# Patient Record
Sex: Male | Born: 1965 | Race: Black or African American | Hispanic: No | Smoking: Current every day smoker
Health system: Southern US, Community
[De-identification: ages and names within clinical notes are randomized; demographics above are authoritative.]

## PROBLEM LIST (undated history)

## (undated) DIAGNOSIS — Z955 Presence of coronary angioplasty implant and graft: Secondary | ICD-10-CM

## (undated) DIAGNOSIS — Z8673 Personal history of transient ischemic attack (TIA), and cerebral infarction without residual deficits: Secondary | ICD-10-CM

## (undated) DIAGNOSIS — J309 Allergic rhinitis, unspecified: Secondary | ICD-10-CM

## (undated) DIAGNOSIS — F319 Bipolar disorder, unspecified: Secondary | ICD-10-CM

## (undated) DIAGNOSIS — I639 Cerebral infarction, unspecified: Secondary | ICD-10-CM

## (undated) DIAGNOSIS — I2510 Atherosclerotic heart disease of native coronary artery without angina pectoris: Secondary | ICD-10-CM

## (undated) DIAGNOSIS — I10 Essential (primary) hypertension: Secondary | ICD-10-CM

## (undated) DIAGNOSIS — R519 Headache, unspecified: Secondary | ICD-10-CM

## (undated) DIAGNOSIS — J45909 Unspecified asthma, uncomplicated: Secondary | ICD-10-CM

## (undated) DIAGNOSIS — K219 Gastro-esophageal reflux disease without esophagitis: Secondary | ICD-10-CM

## (undated) DIAGNOSIS — I252 Old myocardial infarction: Secondary | ICD-10-CM

## (undated) DIAGNOSIS — R001 Bradycardia, unspecified: Secondary | ICD-10-CM

## (undated) DIAGNOSIS — I219 Acute myocardial infarction, unspecified: Secondary | ICD-10-CM

## (undated) DIAGNOSIS — F419 Anxiety disorder, unspecified: Secondary | ICD-10-CM

## (undated) DIAGNOSIS — B1920 Unspecified viral hepatitis C without hepatic coma: Secondary | ICD-10-CM

## (undated) DIAGNOSIS — E7800 Pure hypercholesterolemia, unspecified: Secondary | ICD-10-CM

## (undated) HISTORY — DX: Bradycardia, unspecified: R00.1

## (undated) HISTORY — DX: Headache, unspecified: R51.9

## (undated) HISTORY — DX: Unspecified viral hepatitis C without hepatic coma: B19.20

## (undated) HISTORY — DX: Bipolar disorder, unspecified: F31.9

## (undated) HISTORY — DX: Anxiety disorder, unspecified: F41.9

## (undated) HISTORY — DX: Atherosclerotic heart disease of native coronary artery without angina pectoris: I25.10

## (undated) HISTORY — DX: Allergic rhinitis, unspecified: J30.9

## (undated) HISTORY — DX: Personal history of transient ischemic attack (TIA), and cerebral infarction without residual deficits: Z86.73

## (undated) HISTORY — PX: CORONARY STENT PLACEMENT: SHX1402

## (undated) HISTORY — DX: Presence of coronary angioplasty implant and graft: Z95.5

## (undated) HISTORY — DX: Cerebral infarction, unspecified: I63.9

## (undated) HISTORY — DX: Old myocardial infarction: I25.2

## (undated) HISTORY — DX: Pure hypercholesterolemia, unspecified: E78.00

---

## 2003-09-14 ENCOUNTER — Other Ambulatory Visit: Payer: Self-pay

## 2004-02-25 ENCOUNTER — Other Ambulatory Visit: Payer: Self-pay

## 2004-11-11 ENCOUNTER — Inpatient Hospital Stay: Payer: Self-pay | Admitting: Internal Medicine

## 2004-11-11 ENCOUNTER — Other Ambulatory Visit: Payer: Self-pay

## 2004-11-18 ENCOUNTER — Emergency Department: Payer: Self-pay | Admitting: Emergency Medicine

## 2005-07-30 ENCOUNTER — Emergency Department: Payer: Self-pay | Admitting: Internal Medicine

## 2006-02-01 ENCOUNTER — Other Ambulatory Visit: Payer: Self-pay

## 2006-02-02 ENCOUNTER — Inpatient Hospital Stay: Payer: Self-pay | Admitting: Internal Medicine

## 2006-04-08 ENCOUNTER — Ambulatory Visit: Payer: Self-pay | Admitting: Psychiatry

## 2006-04-08 ENCOUNTER — Inpatient Hospital Stay (HOSPITAL_COMMUNITY): Admission: AD | Admit: 2006-04-08 | Discharge: 2006-04-16 | Payer: Self-pay | Admitting: Psychiatry

## 2006-04-20 ENCOUNTER — Ambulatory Visit: Payer: Self-pay | Admitting: Internal Medicine

## 2006-04-21 ENCOUNTER — Ambulatory Visit: Payer: Self-pay | Admitting: *Deleted

## 2006-07-27 ENCOUNTER — Ambulatory Visit: Payer: Self-pay | Admitting: Internal Medicine

## 2006-10-02 ENCOUNTER — Emergency Department (HOSPITAL_COMMUNITY): Admission: EM | Admit: 2006-10-02 | Discharge: 2006-10-02 | Payer: Self-pay | Admitting: Emergency Medicine

## 2006-10-02 ENCOUNTER — Ambulatory Visit: Payer: Self-pay | Admitting: Cardiology

## 2006-10-04 ENCOUNTER — Emergency Department (HOSPITAL_COMMUNITY): Admission: EM | Admit: 2006-10-04 | Discharge: 2006-10-05 | Payer: Self-pay | Admitting: Emergency Medicine

## 2006-10-05 ENCOUNTER — Ambulatory Visit: Payer: Self-pay | Admitting: Cardiology

## 2006-10-05 ENCOUNTER — Ambulatory Visit: Payer: Self-pay | Admitting: Internal Medicine

## 2006-10-12 ENCOUNTER — Ambulatory Visit (HOSPITAL_COMMUNITY): Admission: RE | Admit: 2006-10-12 | Discharge: 2006-10-12 | Payer: Self-pay | Admitting: Cardiology

## 2006-10-12 ENCOUNTER — Ambulatory Visit: Payer: Self-pay | Admitting: Cardiology

## 2006-11-15 ENCOUNTER — Ambulatory Visit: Payer: Self-pay | Admitting: Cardiology

## 2006-11-15 LAB — CONVERTED CEMR LAB
ALT: 16 units/L (ref 0–40)
Alkaline Phosphatase: 74 units/L (ref 39–117)
Total CHOL/HDL Ratio: 5.8
Total Protein: 7.3 g/dL (ref 6.0–8.3)
Triglycerides: 203 mg/dL (ref 0–149)
VLDL: 41 mg/dL — ABNORMAL HIGH (ref 0–40)

## 2007-03-02 ENCOUNTER — Encounter (INDEPENDENT_AMBULATORY_CARE_PROVIDER_SITE_OTHER): Payer: Self-pay | Admitting: *Deleted

## 2007-05-04 ENCOUNTER — Ambulatory Visit: Payer: Self-pay | Admitting: Internal Medicine

## 2007-05-04 LAB — CONVERTED CEMR LAB
AST: 17 units/L (ref 0–37)
Albumin: 4.6 g/dL (ref 3.5–5.2)
Alkaline Phosphatase: 92 units/L (ref 39–117)
Calcium: 9.6 mg/dL (ref 8.4–10.5)
Chloride: 104 meq/L (ref 96–112)
Glucose, Bld: 96 mg/dL (ref 70–99)
Microalb, Ur: 0.28 mg/dL (ref 0.00–1.89)
Potassium: 4.1 meq/L (ref 3.5–5.3)
Sodium: 137 meq/L (ref 135–145)
Total Protein: 8.2 g/dL (ref 6.0–8.3)
Triglycerides: 401 mg/dL — ABNORMAL HIGH (ref <150)

## 2007-08-28 ENCOUNTER — Emergency Department (HOSPITAL_COMMUNITY): Admission: EM | Admit: 2007-08-28 | Discharge: 2007-08-28 | Payer: Self-pay | Admitting: Emergency Medicine

## 2008-02-19 ENCOUNTER — Inpatient Hospital Stay (HOSPITAL_COMMUNITY): Admission: EM | Admit: 2008-02-19 | Discharge: 2008-02-23 | Payer: Self-pay | Admitting: Emergency Medicine

## 2008-02-19 ENCOUNTER — Ambulatory Visit: Payer: Self-pay | Admitting: Infectious Diseases

## 2008-02-21 ENCOUNTER — Encounter: Payer: Self-pay | Admitting: Infectious Diseases

## 2008-02-23 ENCOUNTER — Inpatient Hospital Stay (HOSPITAL_COMMUNITY): Admission: AD | Admit: 2008-02-23 | Discharge: 2008-02-27 | Payer: Self-pay | Admitting: Psychiatry

## 2008-02-23 ENCOUNTER — Ambulatory Visit: Payer: Self-pay | Admitting: Psychiatry

## 2008-03-09 ENCOUNTER — Ambulatory Visit: Payer: Self-pay | Admitting: Internal Medicine

## 2008-03-09 ENCOUNTER — Inpatient Hospital Stay (HOSPITAL_COMMUNITY): Admission: EM | Admit: 2008-03-09 | Discharge: 2008-03-13 | Payer: Self-pay | Admitting: Emergency Medicine

## 2008-03-13 ENCOUNTER — Inpatient Hospital Stay (HOSPITAL_COMMUNITY): Admission: RE | Admit: 2008-03-13 | Discharge: 2008-03-19 | Payer: Self-pay | Admitting: Psychiatry

## 2008-04-11 ENCOUNTER — Ambulatory Visit: Payer: Self-pay | Admitting: Family Medicine

## 2008-04-11 LAB — CONVERTED CEMR LAB
BUN: 16 mg/dL (ref 6–23)
Benzodiazepines.: NEGATIVE
CO2: 20 meq/L (ref 19–32)
Calcium: 9.7 mg/dL (ref 8.4–10.5)
Chloride: 109 meq/L (ref 96–112)
Creatinine, Ser: 1.27 mg/dL (ref 0.40–1.50)
Creatinine,U: 309.2 mg/dL
Glucose, Bld: 99 mg/dL (ref 70–99)
HCT: 44.4 % (ref 39.0–52.0)
Hemoglobin: 14.4 g/dL (ref 13.0–17.0)
Lymphocytes Relative: 28 % (ref 12–46)
Monocytes Absolute: 1 10*3/uL (ref 0.1–1.0)
Monocytes Relative: 11 % (ref 3–12)
Neutro Abs: 5.3 10*3/uL (ref 1.7–7.7)
Opiate Screen, Urine: NEGATIVE
Phencyclidine (PCP): NEGATIVE
Propoxyphene: NEGATIVE
RBC: 4.74 M/uL (ref 4.22–5.81)
TSH: 0.962 microintl units/mL (ref 0.350–4.50)

## 2008-06-25 ENCOUNTER — Emergency Department (HOSPITAL_COMMUNITY): Admission: EM | Admit: 2008-06-25 | Discharge: 2008-06-25 | Payer: Self-pay | Admitting: Emergency Medicine

## 2008-06-29 ENCOUNTER — Encounter: Payer: Self-pay | Admitting: *Deleted

## 2008-06-29 ENCOUNTER — Ambulatory Visit: Payer: Self-pay | Admitting: *Deleted

## 2008-06-29 ENCOUNTER — Inpatient Hospital Stay (HOSPITAL_COMMUNITY): Admission: AD | Admit: 2008-06-29 | Discharge: 2008-07-10 | Payer: Self-pay | Admitting: *Deleted

## 2008-07-26 ENCOUNTER — Inpatient Hospital Stay (HOSPITAL_COMMUNITY): Admission: AD | Admit: 2008-07-26 | Discharge: 2008-08-03 | Payer: Self-pay | Admitting: Psychiatry

## 2008-07-26 ENCOUNTER — Encounter: Payer: Self-pay | Admitting: Emergency Medicine

## 2008-07-28 ENCOUNTER — Encounter: Payer: Self-pay | Admitting: Psychiatry

## 2008-09-02 ENCOUNTER — Inpatient Hospital Stay (HOSPITAL_COMMUNITY): Admission: EM | Admit: 2008-09-02 | Discharge: 2008-09-04 | Payer: Self-pay | Admitting: Emergency Medicine

## 2008-09-04 ENCOUNTER — Inpatient Hospital Stay (HOSPITAL_COMMUNITY): Admission: RE | Admit: 2008-09-04 | Discharge: 2008-09-11 | Payer: Self-pay | Admitting: *Deleted

## 2008-09-04 ENCOUNTER — Ambulatory Visit: Payer: Self-pay | Admitting: *Deleted

## 2008-09-17 ENCOUNTER — Inpatient Hospital Stay (HOSPITAL_COMMUNITY): Admission: RE | Admit: 2008-09-17 | Discharge: 2008-09-27 | Payer: Self-pay | Admitting: Psychiatry

## 2008-09-17 ENCOUNTER — Other Ambulatory Visit: Payer: Self-pay

## 2008-10-07 ENCOUNTER — Other Ambulatory Visit: Payer: Self-pay | Admitting: Emergency Medicine

## 2008-10-08 ENCOUNTER — Ambulatory Visit: Payer: Self-pay | Admitting: Psychiatry

## 2008-10-08 ENCOUNTER — Inpatient Hospital Stay (HOSPITAL_COMMUNITY): Admission: RE | Admit: 2008-10-08 | Discharge: 2008-10-15 | Payer: Self-pay | Admitting: Psychiatry

## 2008-11-02 ENCOUNTER — Other Ambulatory Visit: Payer: Self-pay | Admitting: Emergency Medicine

## 2008-11-03 ENCOUNTER — Ambulatory Visit: Payer: Self-pay | Admitting: Psychiatry

## 2008-11-03 ENCOUNTER — Inpatient Hospital Stay (HOSPITAL_COMMUNITY): Admission: EM | Admit: 2008-11-03 | Discharge: 2008-11-13 | Payer: Self-pay | Admitting: Psychiatry

## 2008-12-27 ENCOUNTER — Encounter: Admission: RE | Admit: 2008-12-27 | Discharge: 2008-12-27 | Payer: Self-pay | Admitting: Internal Medicine

## 2009-01-07 ENCOUNTER — Other Ambulatory Visit: Payer: Self-pay

## 2009-01-08 ENCOUNTER — Ambulatory Visit: Payer: Self-pay | Admitting: Psychiatry

## 2009-01-08 ENCOUNTER — Inpatient Hospital Stay (HOSPITAL_COMMUNITY): Admission: EM | Admit: 2009-01-08 | Discharge: 2009-01-14 | Payer: Self-pay | Admitting: Psychiatry

## 2009-02-08 ENCOUNTER — Emergency Department (HOSPITAL_COMMUNITY): Admission: EM | Admit: 2009-02-08 | Discharge: 2009-02-08 | Payer: Self-pay | Admitting: Emergency Medicine

## 2009-02-13 ENCOUNTER — Emergency Department (HOSPITAL_COMMUNITY): Admission: EM | Admit: 2009-02-13 | Discharge: 2009-02-13 | Payer: Self-pay | Admitting: Emergency Medicine

## 2009-04-05 ENCOUNTER — Encounter: Admission: RE | Admit: 2009-04-05 | Discharge: 2009-04-05 | Payer: Self-pay | Admitting: Internal Medicine

## 2009-04-23 ENCOUNTER — Emergency Department (HOSPITAL_COMMUNITY): Admission: EM | Admit: 2009-04-23 | Discharge: 2009-04-23 | Payer: Self-pay | Admitting: Emergency Medicine

## 2009-05-20 ENCOUNTER — Emergency Department (HOSPITAL_COMMUNITY): Admission: EM | Admit: 2009-05-20 | Discharge: 2009-05-20 | Payer: Self-pay | Admitting: Emergency Medicine

## 2009-09-16 ENCOUNTER — Other Ambulatory Visit: Payer: Self-pay | Admitting: Emergency Medicine

## 2009-09-17 ENCOUNTER — Ambulatory Visit: Payer: Self-pay | Admitting: Psychiatry

## 2009-09-17 ENCOUNTER — Inpatient Hospital Stay (HOSPITAL_COMMUNITY): Admission: AD | Admit: 2009-09-17 | Discharge: 2009-09-25 | Payer: Self-pay | Admitting: Psychiatry

## 2009-09-27 IMAGING — CR DG LUMBAR SPINE 2-3V
3 series · 3 of 3 positions shown · non-contrast
Comparison: None

CLINICAL DATA: Low back pain and right leg pain with some weakness

LUMBAR SPINE - 2-3 VIEW

[t l-spine a.p.]
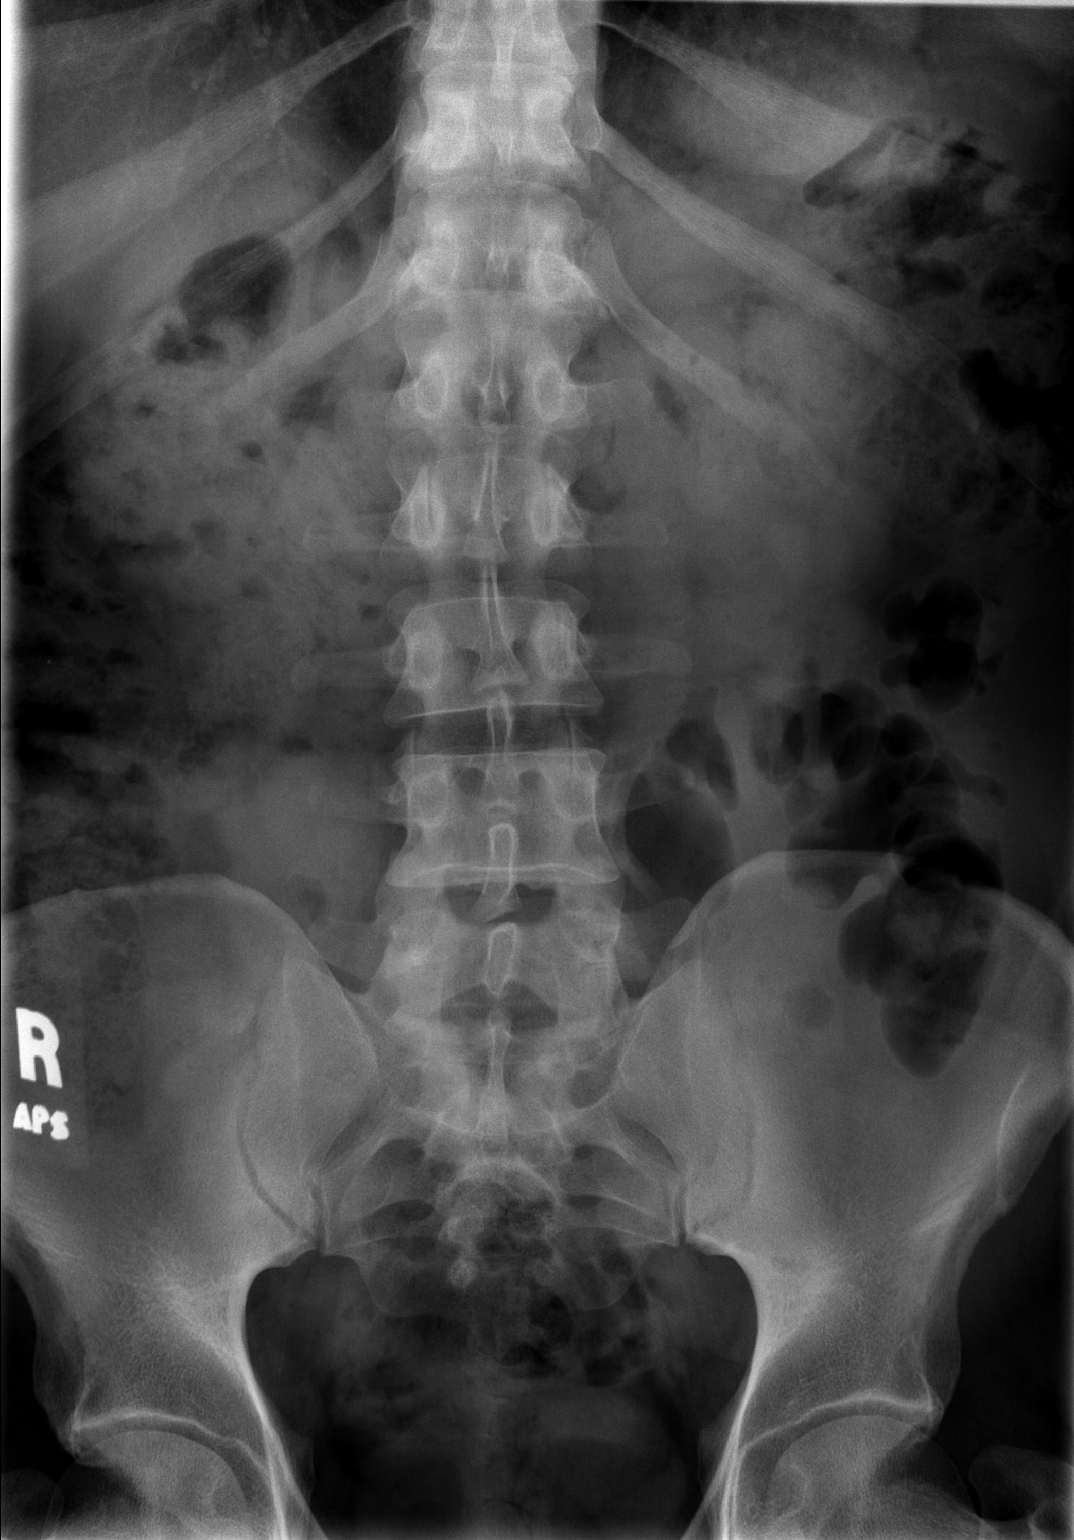

[t l-spine lat]
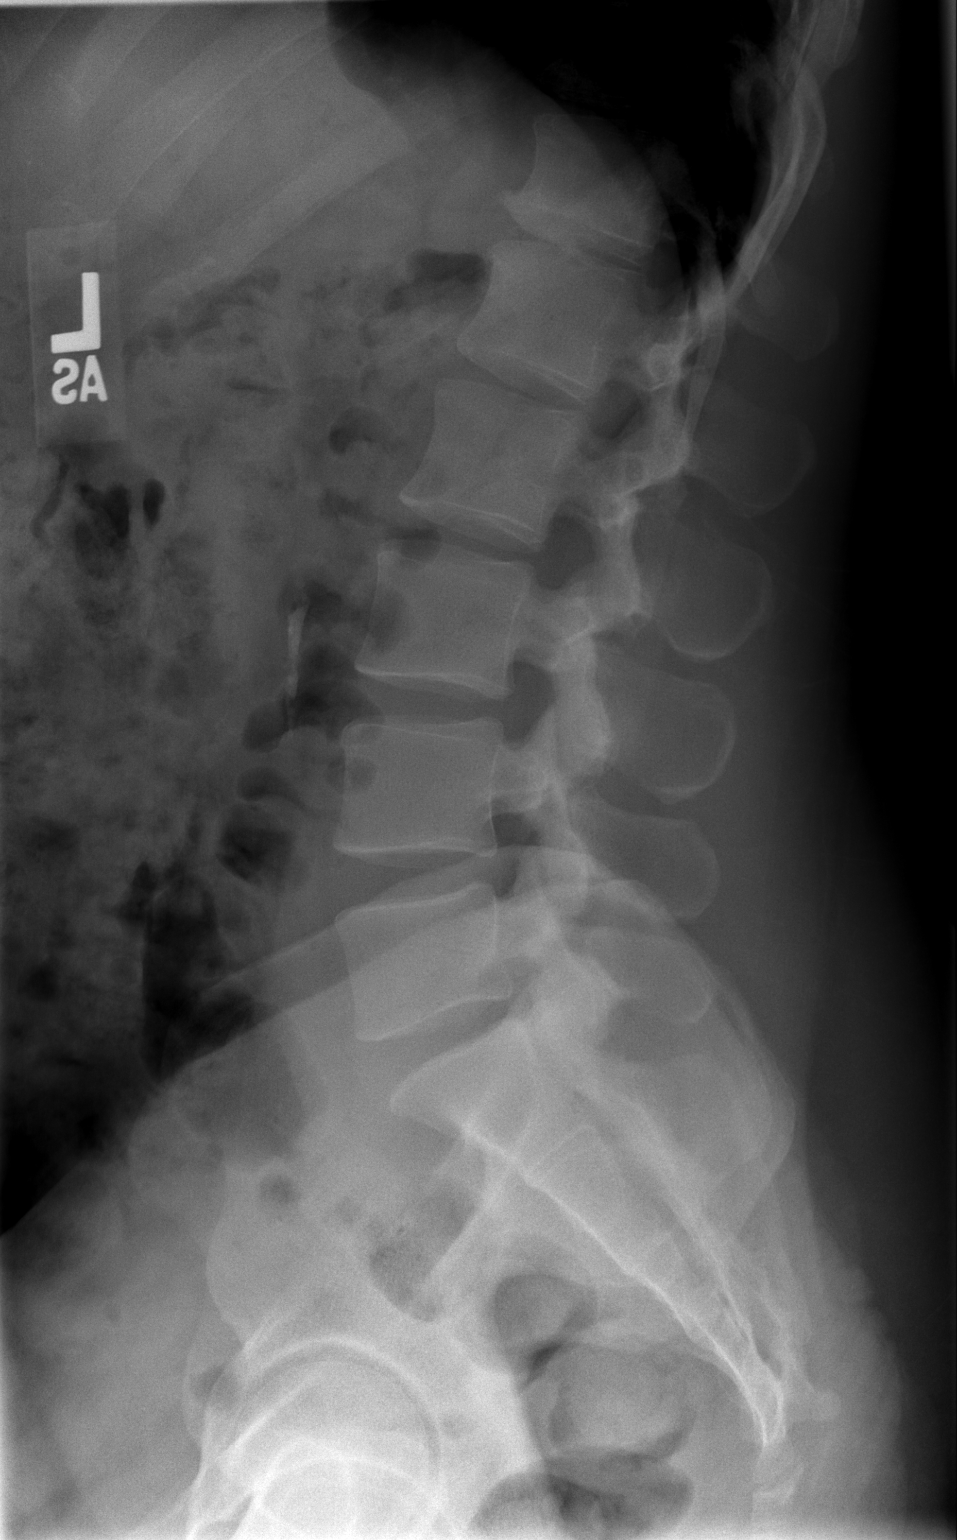

[t l-spine l5-s1 spot]
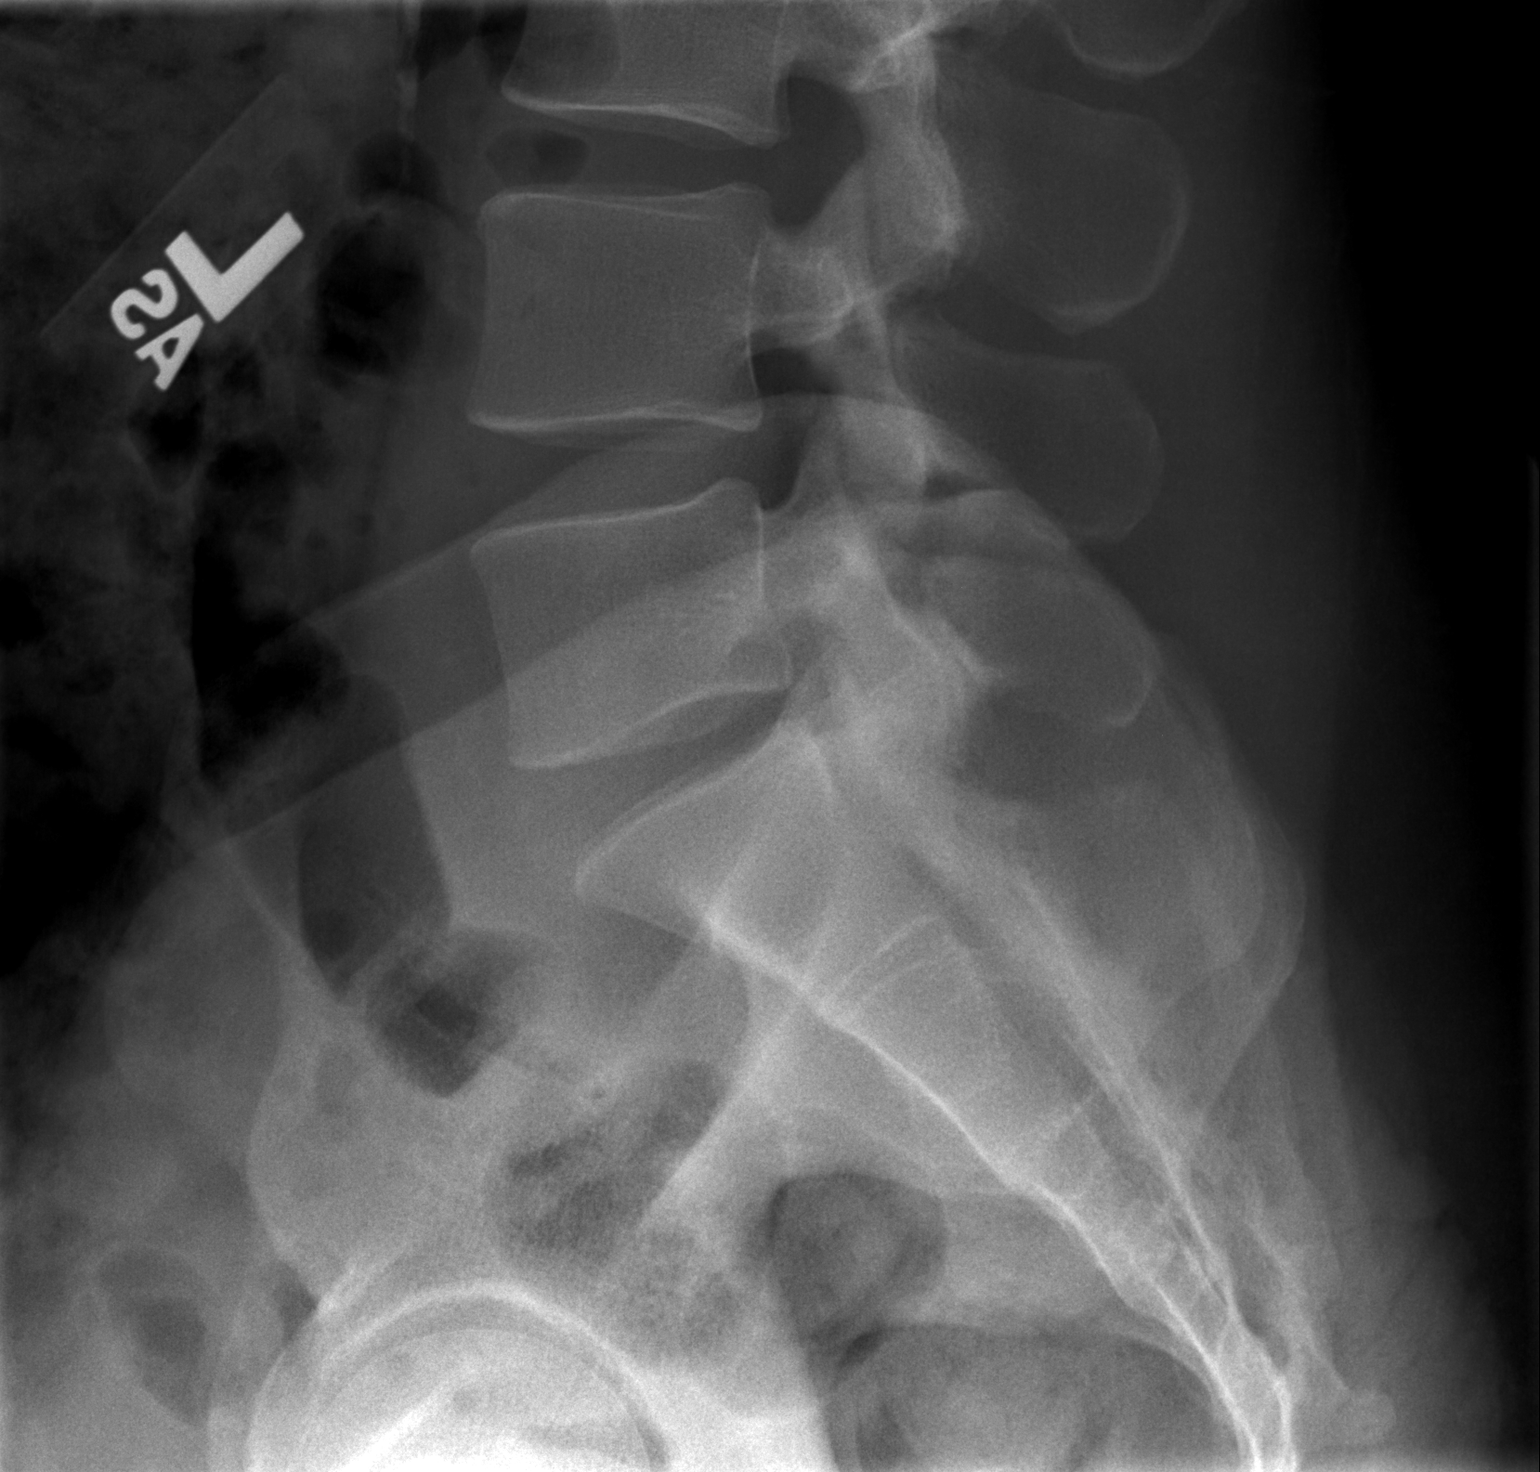

[3 of 3 positions shown; findings below may reference images not displayed]

FINDINGS: The lumbar vertebrae are in normal alignment with normal
intervertebral disc spaces.  No compression deformity is seen.  The
SI joints are normally corticated.
IMPRESSION: Normal alignment with normal disc spaces.

## 2009-12-30 ENCOUNTER — Emergency Department (HOSPITAL_COMMUNITY): Admission: EM | Admit: 2009-12-30 | Discharge: 2009-12-30 | Payer: Self-pay | Admitting: Emergency Medicine

## 2010-01-15 ENCOUNTER — Ambulatory Visit: Payer: Self-pay | Admitting: Vascular Surgery

## 2010-01-15 ENCOUNTER — Inpatient Hospital Stay (HOSPITAL_COMMUNITY): Admission: EM | Admit: 2010-01-15 | Discharge: 2010-01-16 | Payer: Self-pay | Admitting: Emergency Medicine

## 2010-01-15 ENCOUNTER — Encounter (INDEPENDENT_AMBULATORY_CARE_PROVIDER_SITE_OTHER): Payer: Self-pay | Admitting: Internal Medicine

## 2010-01-16 ENCOUNTER — Inpatient Hospital Stay (HOSPITAL_COMMUNITY): Admission: AD | Admit: 2010-01-16 | Discharge: 2010-01-20 | Payer: Self-pay | Admitting: Psychiatry

## 2010-01-16 ENCOUNTER — Ambulatory Visit: Payer: Self-pay | Admitting: Psychiatry

## 2010-02-14 ENCOUNTER — Other Ambulatory Visit: Payer: Self-pay | Admitting: Emergency Medicine

## 2010-02-15 ENCOUNTER — Inpatient Hospital Stay (HOSPITAL_COMMUNITY): Admission: AD | Admit: 2010-02-15 | Discharge: 2010-02-17 | Payer: Self-pay | Admitting: Psychiatry

## 2010-02-17 ENCOUNTER — Inpatient Hospital Stay (HOSPITAL_COMMUNITY): Admission: EM | Admit: 2010-02-17 | Discharge: 2010-02-19 | Payer: Self-pay | Admitting: Emergency Medicine

## 2010-02-18 ENCOUNTER — Ambulatory Visit: Payer: Self-pay | Admitting: Psychiatry

## 2010-02-19 ENCOUNTER — Ambulatory Visit: Payer: Self-pay | Admitting: Psychiatry

## 2010-02-19 ENCOUNTER — Inpatient Hospital Stay (HOSPITAL_COMMUNITY): Admission: EM | Admit: 2010-02-19 | Discharge: 2010-02-21 | Payer: Self-pay | Admitting: Psychiatry

## 2010-03-02 ENCOUNTER — Inpatient Hospital Stay (HOSPITAL_COMMUNITY): Admission: EM | Admit: 2010-03-02 | Discharge: 2010-03-03 | Payer: Self-pay | Admitting: Emergency Medicine

## 2010-03-08 ENCOUNTER — Ambulatory Visit: Payer: Self-pay | Admitting: Psychiatry

## 2010-06-19 ENCOUNTER — Inpatient Hospital Stay (HOSPITAL_COMMUNITY)
Admission: AD | Admit: 2010-06-19 | Discharge: 2010-06-24 | Payer: Self-pay | Source: Home / Self Care | Attending: Psychiatry | Admitting: Psychiatry

## 2010-06-19 ENCOUNTER — Emergency Department (HOSPITAL_COMMUNITY)
Admission: EM | Admit: 2010-06-19 | Discharge: 2010-06-19 | Disposition: A | Discharge status: Other Acute Inpatient Hospital | Payer: Self-pay | Source: Home / Self Care | Admitting: Emergency Medicine

## 2010-06-19 LAB — DIFFERENTIAL
Basophils Absolute: 0.1 10*3/uL (ref 0.0–0.1)
Basophils Relative: 0 % (ref 0–1)
Eosinophils Absolute: 0.3 10*3/uL (ref 0.0–0.7)
Eosinophils Relative: 2 % (ref 0–5)
Lymphocytes Relative: 24 % (ref 12–46)
Lymphs Abs: 3.4 10*3/uL (ref 0.7–4.0)
Monocytes Absolute: 1 10*3/uL (ref 0.1–1.0)
Monocytes Relative: 7 % (ref 3–12)
Neutro Abs: 9.1 10*3/uL — ABNORMAL HIGH (ref 1.7–7.7)
Neutrophils Relative %: 66 % (ref 43–77)

## 2010-06-19 LAB — COMPREHENSIVE METABOLIC PANEL
ALT: 31 U/L (ref 0–53)
AST: 27 U/L (ref 0–37)
Albumin: 3.9 g/dL (ref 3.5–5.2)
Alkaline Phosphatase: 73 U/L (ref 39–117)
BUN: 16 mg/dL (ref 6–23)
CO2: 23 mEq/L (ref 19–32)
Calcium: 9 mg/dL (ref 8.4–10.5)
Chloride: 106 mEq/L (ref 96–112)
Creatinine, Ser: 1.35 mg/dL (ref 0.4–1.5)
GFR calc Af Amer: >60 mL/min (ref >60)
GFR calc non Af Amer: 57 mL/min — ABNORMAL LOW (ref >60)
Glucose, Bld: 158 mg/dL — ABNORMAL HIGH (ref 70–99)
Potassium: 3.4 mEq/L — ABNORMAL LOW (ref 3.5–5.1)
Sodium: 138 mEq/L (ref 135–145)
Total Bilirubin: 0.1 mg/dL — ABNORMAL LOW (ref 0.3–1.2)
Total Protein: 7.4 g/dL (ref 6.0–8.3)

## 2010-06-19 LAB — CBC
HCT: 35.4 % — ABNORMAL LOW (ref 39.0–52.0)
Hemoglobin: 12 g/dL — ABNORMAL LOW (ref 13.0–17.0)
MCH: 31.1 pg (ref 26.0–34.0)
MCHC: 33.9 g/dL (ref 30.0–36.0)
MCV: 91.7 fL (ref 78.0–100.0)
Platelets: 268 10*3/uL (ref 150–400)
RBC: 3.86 MIL/uL — ABNORMAL LOW (ref 4.22–5.81)
RDW: 14.3 % (ref 11.5–15.5)
WBC: 13.8 10*3/uL — ABNORMAL HIGH (ref 4.0–10.5)

## 2010-06-19 LAB — ETHANOL: Alcohol, Ethyl (B): <5 mg/dL (ref 0–10)

## 2010-06-19 LAB — RAPID URINE DRUG SCREEN, HOSP PERFORMED
Amphetamines: NOT DETECTED
Barbiturates: NOT DETECTED
Benzodiazepines: NOT DETECTED
Cocaine: POSITIVE — AB
Opiates: NOT DETECTED
Tetrahydrocannabinol: POSITIVE — AB

## 2010-06-19 LAB — ACETAMINOPHEN LEVEL: Acetaminophen (Tylenol), Serum: <10 ug/mL — ABNORMAL LOW (ref 10–30)

## 2010-08-28 LAB — BASIC METABOLIC PANEL WITH GFR
Calcium: 8.4 mg/dL (ref 8.4–10.5)
GFR calc Af Amer: >60 mL/min (ref >60)
GFR calc non Af Amer: >60 mL/min (ref >60)
Sodium: 138 meq/L (ref 135–145)

## 2010-08-28 LAB — BASIC METABOLIC PANEL
BUN: 18 mg/dL (ref 6–23)
CO2: 21 mEq/L (ref 19–32)
CO2: 21 mEq/L (ref 19–32)
CO2: 22 mEq/L (ref 19–32)
Calcium: 9.5 mg/dL (ref 8.4–10.5)
Chloride: 102 mEq/L (ref 96–112)
Chloride: 107 mEq/L (ref 96–112)
Creatinine, Ser: 1.09 mg/dL (ref 0.4–1.5)
Creatinine, Ser: 1.14 mg/dL (ref 0.4–1.5)
Creatinine, Ser: 1.24 mg/dL (ref 0.4–1.5)
GFR calc Af Amer: >60 mL/min (ref >60)
GFR calc Af Amer: >60 mL/min (ref >60)
GFR calc non Af Amer: >60 mL/min (ref >60)
Glucose, Bld: 122 mg/dL — ABNORMAL HIGH (ref 70–99)
Glucose, Bld: 130 mg/dL — ABNORMAL HIGH (ref 70–99)
Potassium: 2.9 mEq/L — ABNORMAL LOW (ref 3.5–5.1)
Sodium: 138 mEq/L (ref 135–145)

## 2010-08-28 LAB — CBC
HCT: 36.3 % — ABNORMAL LOW (ref 39.0–52.0)
HCT: 34.8 % — ABNORMAL LOW (ref 39.0–52.0)
HCT: 36 % — ABNORMAL LOW (ref 39.0–52.0)
Hemoglobin: 12.7 g/dL — ABNORMAL LOW (ref 13.0–17.0)
Hemoglobin: 12.3 g/dL — ABNORMAL LOW (ref 13.0–17.0)
Hemoglobin: 12.5 g/dL — ABNORMAL LOW (ref 13.0–17.0)
MCH: 30.9 pg (ref 26.0–34.0)
MCH: 31 pg (ref 26.0–34.0)
MCH: 31.1 pg (ref 26.0–34.0)
MCH: 32 pg (ref 26.0–34.0)
MCHC: 35 g/dL (ref 30.0–36.0)
MCHC: 35.3 g/dL (ref 30.0–36.0)
MCV: 88.1 fL (ref 78.0–100.0)
MCV: 92.6 fL (ref 78.0–100.0)
Platelets: 273 10*3/uL (ref 150–400)
Platelets: 236 10*3/uL (ref 150–400)
Platelets: 240 10*3/uL (ref 150–400)
Platelets: 243 10*3/uL (ref 150–400)
RBC: 4.11 MIL/uL — ABNORMAL LOW (ref 4.22–5.81)
RBC: 4.29 MIL/uL (ref 4.22–5.81)
RBC: 3.89 MIL/uL — ABNORMAL LOW (ref 4.22–5.81)
RBC: 3.91 MIL/uL — ABNORMAL LOW (ref 4.22–5.81)
RBC: 3.95 MIL/uL — ABNORMAL LOW (ref 4.22–5.81)
RDW: 13.5 % (ref 11.5–15.5)
RDW: 13.5 % (ref 11.5–15.5)
WBC: 10.3 10*3/uL (ref 4.0–10.5)
WBC: 10.4 10*3/uL (ref 4.0–10.5)
WBC: 13.9 K/uL — ABNORMAL HIGH (ref 4.0–10.5)

## 2010-08-28 LAB — COMPREHENSIVE METABOLIC PANEL
ALT: 30 U/L (ref 0–53)
Albumin: 4 g/dL (ref 3.5–5.2)
Alkaline Phosphatase: 67 U/L (ref 39–117)
Alkaline Phosphatase: 81 U/L (ref 39–117)
BUN: 13 mg/dL (ref 6–23)
CO2: 20 mEq/L (ref 19–32)
Calcium: 9.3 mg/dL (ref 8.4–10.5)
Chloride: 107 mEq/L (ref 96–112)
Creatinine, Ser: 1.27 mg/dL (ref 0.4–1.5)
GFR calc non Af Amer: >60 mL/min (ref >60)
Glucose, Bld: 101 mg/dL — ABNORMAL HIGH (ref 70–99)
Potassium: 3.8 mEq/L (ref 3.5–5.1)
Sodium: 136 mEq/L (ref 135–145)
Total Bilirubin: 0.7 mg/dL (ref 0.3–1.2)
Total Protein: 7.9 g/dL (ref 6.0–8.3)

## 2010-08-28 LAB — DIFFERENTIAL
Basophils Absolute: 0 10*3/uL (ref 0.0–0.1)
Basophils Absolute: 0.1 10*3/uL (ref 0.0–0.1)
Basophils Relative: 0 % (ref 0–1)
Eosinophils Absolute: 0.2 10*3/uL (ref 0.0–0.7)
Eosinophils Relative: 1 % (ref 0–5)
Lymphocytes Relative: 41 % (ref 12–46)
Lymphocytes Relative: 22 % (ref 12–46)
Lymphocytes Relative: 30 % (ref 12–46)
Lymphs Abs: 3.6 10*3/uL (ref 0.7–4.0)
Lymphs Abs: 3.1 10*3/uL (ref 0.7–4.0)
Lymphs Abs: 3.1 K/uL (ref 0.7–4.0)
Monocytes Absolute: 1.2 10*3/uL — ABNORMAL HIGH (ref 0.1–1.0)
Monocytes Absolute: 1.7 10*3/uL — ABNORMAL HIGH (ref 0.1–1.0)
Monocytes Relative: 14 % — ABNORMAL HIGH (ref 3–12)
Monocytes Relative: 12 % (ref 3–12)
Neutro Abs: 3.9 10*3/uL (ref 1.7–7.7)
Neutro Abs: 5.8 10*3/uL (ref 1.7–7.7)
Neutro Abs: 9 K/uL — ABNORMAL HIGH (ref 1.7–7.7)
Neutrophils Relative %: 65 % (ref 43–77)

## 2010-08-28 LAB — RAPID URINE DRUG SCREEN, HOSP PERFORMED
Amphetamines: NOT DETECTED
Amphetamines: NOT DETECTED
Barbiturates: NOT DETECTED
Benzodiazepines: NOT DETECTED
Benzodiazepines: NOT DETECTED
Benzodiazepines: NOT DETECTED
Cocaine: NOT DETECTED
Cocaine: POSITIVE — AB
Cocaine: POSITIVE — AB
Opiates: NOT DETECTED
Tetrahydrocannabinol: NOT DETECTED
Tetrahydrocannabinol: NOT DETECTED
Tetrahydrocannabinol: NOT DETECTED

## 2010-08-28 LAB — URINALYSIS, ROUTINE W REFLEX MICROSCOPIC
Glucose, UA: NEGATIVE mg/dL
Ketones, ur: 15 mg/dL — AB
Ketones, ur: NEGATIVE mg/dL
Nitrite: NEGATIVE
Nitrite: NEGATIVE
Protein, ur: NEGATIVE mg/dL
Protein, ur: NEGATIVE mg/dL
pH: 7 (ref 5.0–8.0)

## 2010-08-28 LAB — URINE CULTURE
Culture  Setup Time: 201109051057
Culture: NO GROWTH

## 2010-08-28 LAB — CARDIAC PANEL(CRET KIN+CKTOT+MB+TROPI)
CK, MB: 2.5 ng/mL (ref 0.3–4.0)
CK, MB: 2.6 ng/mL (ref 0.3–4.0)
Total CK: 159 U/L (ref 7–232)
Total CK: 176 U/L (ref 7–232)
Troponin I: 0.02 ng/mL (ref 0.00–0.06)
Troponin I: 0.02 ng/mL (ref 0.00–0.06)

## 2010-08-28 LAB — PROTIME-INR
INR: 1.01 (ref 0.00–1.49)
Prothrombin Time: 13.5 seconds (ref 11.6–15.2)
Prothrombin Time: 12.5 seconds (ref 11.6–15.2)

## 2010-08-28 LAB — POCT I-STAT, CHEM 8
HCT: 38 % — ABNORMAL LOW (ref 39.0–52.0)
Hemoglobin: 12.9 g/dL — ABNORMAL LOW (ref 13.0–17.0)
Potassium: 4.1 mEq/L (ref 3.5–5.1)
Sodium: 139 mEq/L (ref 135–145)
TCO2: 22 mmol/L (ref 0–100)

## 2010-08-28 LAB — MAGNESIUM
Magnesium: 2 mg/dL (ref 1.5–2.5)
Magnesium: 1.9 mg/dL (ref 1.5–2.5)

## 2010-08-28 LAB — GLUCOSE, CAPILLARY: Glucose-Capillary: 330 mg/dL — ABNORMAL HIGH (ref 70–99)

## 2010-08-28 LAB — APTT
aPTT: 35 seconds (ref 24–37)
aPTT: 35 seconds (ref 24–37)

## 2010-08-28 LAB — LIPID PANEL
Cholesterol: 157 mg/dL (ref 0–200)
LDL Cholesterol: 89 mg/dL (ref 0–99)
VLDL: 21 mg/dL (ref 0–40)

## 2010-08-28 LAB — MRSA PCR SCREENING: MRSA by PCR: NEGATIVE

## 2010-08-28 LAB — TRICYCLICS SCREEN, URINE: TCA Scrn: NOT DETECTED

## 2010-08-28 LAB — CK: Total CK: 162 U/L (ref 7–232)

## 2010-08-28 LAB — ETHANOL: Alcohol, Ethyl (B): <5 mg/dL (ref 0–10)

## 2010-08-29 LAB — POCT I-STAT, CHEM 8
Calcium, Ion: 1.17 mmol/L (ref 1.12–1.32)
Creatinine, Ser: 1.3 mg/dL (ref 0.4–1.5)
Glucose, Bld: 96 mg/dL (ref 70–99)
Hemoglobin: 14.6 g/dL (ref 13.0–17.0)
TCO2: 19 mmol/L (ref 0–100)

## 2010-08-29 LAB — COMPREHENSIVE METABOLIC PANEL
AST: 34 U/L (ref 0–37)
Albumin: 4.2 g/dL (ref 3.5–5.2)
BUN: 14 mg/dL (ref 6–23)
Calcium: 9.4 mg/dL (ref 8.4–10.5)
Chloride: 108 mEq/L (ref 96–112)
Creatinine, Ser: 1.19 mg/dL (ref 0.4–1.5)
GFR calc Af Amer: >60 mL/min (ref >60)
Total Bilirubin: 1 mg/dL (ref 0.3–1.2)
Total Protein: 7.7 g/dL (ref 6.0–8.3)

## 2010-08-29 LAB — CBC
HCT: 40.4 % (ref 39.0–52.0)
MCH: 32.1 pg (ref 26.0–34.0)
MCHC: 34.4 g/dL (ref 30.0–36.0)
MCHC: 34.6 g/dL (ref 30.0–36.0)
MCV: 92.8 fL (ref 78.0–100.0)
MCV: 93.4 fL (ref 78.0–100.0)
Platelets: 207 10*3/uL (ref 150–400)
RBC: 4.15 MIL/uL — ABNORMAL LOW (ref 4.22–5.81)
RDW: 13.6 % (ref 11.5–15.5)
RDW: 14 % (ref 11.5–15.5)

## 2010-08-29 LAB — URINE CULTURE

## 2010-08-29 LAB — PROTIME-INR
INR: 1.09 (ref 0.00–1.49)
Prothrombin Time: 14.3 seconds (ref 11.6–15.2)

## 2010-08-29 LAB — ETHANOL: Alcohol, Ethyl (B): <5 mg/dL (ref 0–10)

## 2010-08-29 LAB — DIFFERENTIAL
Basophils Absolute: 0 10*3/uL (ref 0.0–0.1)
Basophils Relative: 0 % (ref 0–1)
Eosinophils Relative: 1 % (ref 0–5)
Monocytes Absolute: 1 10*3/uL (ref 0.1–1.0)

## 2010-08-29 LAB — CARDIAC PANEL(CRET KIN+CKTOT+MB+TROPI)
CK, MB: 1.6 ng/mL (ref 0.3–4.0)
Total CK: 127 U/L (ref 7–232)
Troponin I: 0.03 ng/mL (ref 0.00–0.06)

## 2010-08-29 LAB — URINALYSIS, ROUTINE W REFLEX MICROSCOPIC
Glucose, UA: 100 mg/dL — AB
Hgb urine dipstick: NEGATIVE
Specific Gravity, Urine: 1.029 (ref 1.005–1.030)
Urobilinogen, UA: 1 mg/dL (ref 0.0–1.0)

## 2010-08-29 LAB — RAPID URINE DRUG SCREEN, HOSP PERFORMED
Amphetamines: NOT DETECTED
Opiates: NOT DETECTED
Tetrahydrocannabinol: NOT DETECTED

## 2010-08-29 LAB — LIPID PANEL
Cholesterol: 164 mg/dL (ref 0–200)
HDL: 43 mg/dL (ref >39)

## 2010-08-29 LAB — HEMOGLOBIN A1C: Mean Plasma Glucose: 123 mg/dL — ABNORMAL HIGH (ref <117)

## 2010-08-29 LAB — APTT: aPTT: 35 seconds (ref 24–37)

## 2010-08-29 LAB — URINE MICROSCOPIC-ADD ON

## 2010-08-29 LAB — HOMOCYSTEINE: Homocysteine: 14.5 umol/L (ref 4.0–15.4)

## 2010-09-03 LAB — BASIC METABOLIC PANEL
BUN: 15 mg/dL (ref 6–23)
Calcium: 9.5 mg/dL (ref 8.4–10.5)
Creatinine, Ser: 1.56 mg/dL — ABNORMAL HIGH (ref 0.4–1.5)
GFR calc Af Amer: 59 mL/min — ABNORMAL LOW (ref >60)
GFR calc non Af Amer: 49 mL/min — ABNORMAL LOW (ref >60)

## 2010-09-03 LAB — URINALYSIS, ROUTINE W REFLEX MICROSCOPIC
Glucose, UA: 100 mg/dL — AB
Glucose, UA: NEGATIVE mg/dL
Hgb urine dipstick: NEGATIVE
Hgb urine dipstick: NEGATIVE
Protein, ur: NEGATIVE mg/dL
Specific Gravity, Urine: 1.023 (ref 1.005–1.030)
Specific Gravity, Urine: 1.026 (ref 1.005–1.030)
Urobilinogen, UA: 1 mg/dL (ref 0.0–1.0)

## 2010-09-03 LAB — DIFFERENTIAL
Eosinophils Absolute: 0.2 10*3/uL (ref 0.0–0.7)
Lymphocytes Relative: 25 % (ref 12–46)
Lymphs Abs: 2.3 10*3/uL (ref 0.7–4.0)
Neutro Abs: 5.9 10*3/uL (ref 1.7–7.7)
Neutrophils Relative %: 65 % (ref 43–77)

## 2010-09-03 LAB — URINE MICROSCOPIC-ADD ON

## 2010-09-03 LAB — CBC
Platelets: 193 10*3/uL (ref 150–400)
RBC: 4.68 MIL/uL (ref 4.22–5.81)
WBC: 9.1 10*3/uL (ref 4.0–10.5)

## 2010-09-03 LAB — RAPID URINE DRUG SCREEN, HOSP PERFORMED
Amphetamines: NOT DETECTED
Barbiturates: NOT DETECTED
Benzodiazepines: NOT DETECTED
Opiates: NOT DETECTED

## 2010-09-03 LAB — ETHANOL: Alcohol, Ethyl (B): <5 mg/dL (ref 0–10)

## 2010-09-21 LAB — RAPID URINE DRUG SCREEN, HOSP PERFORMED
Amphetamines: NOT DETECTED
Barbiturates: NOT DETECTED
Benzodiazepines: NOT DETECTED
Cocaine: POSITIVE — AB
Opiates: NOT DETECTED

## 2010-09-23 LAB — DIFFERENTIAL
Basophils Absolute: 0.1 10*3/uL (ref 0.0–0.1)
Basophils Relative: 1 % (ref 0–1)
Neutro Abs: 5.3 10*3/uL (ref 1.7–7.7)
Neutrophils Relative %: 63 % (ref 43–77)

## 2010-09-23 LAB — POCT I-STAT, CHEM 8
Chloride: 109 mEq/L (ref 96–112)
HCT: 42 % (ref 39.0–52.0)
Potassium: 3.8 mEq/L (ref 3.5–5.1)

## 2010-09-23 LAB — URINALYSIS, ROUTINE W REFLEX MICROSCOPIC
Bilirubin Urine: NEGATIVE
Hgb urine dipstick: NEGATIVE
Ketones, ur: NEGATIVE mg/dL
Specific Gravity, Urine: 1.031 — ABNORMAL HIGH (ref 1.005–1.030)
Urobilinogen, UA: 1 mg/dL (ref 0.0–1.0)

## 2010-09-23 LAB — RAPID URINE DRUG SCREEN, HOSP PERFORMED
Amphetamines: NOT DETECTED
Cocaine: POSITIVE — AB
Opiates: NOT DETECTED
Tetrahydrocannabinol: NOT DETECTED

## 2010-09-23 LAB — CBC
MCHC: 34 g/dL (ref 30.0–36.0)
RBC: 4.35 MIL/uL (ref 4.22–5.81)
RDW: 13.5 % (ref 11.5–15.5)

## 2010-09-24 LAB — CBC
HCT: 37.1 % — ABNORMAL LOW (ref 39.0–52.0)
Hemoglobin: 14.2 g/dL (ref 13.0–17.0)
Hemoglobin: 12.9 g/dL — ABNORMAL LOW (ref 13.0–17.0)
MCV: 91.8 fL (ref 78.0–100.0)
Platelets: 196 10*3/uL (ref 150–400)
RBC: 4.41 MIL/uL (ref 4.22–5.81)
RDW: 13.8 % (ref 11.5–15.5)

## 2010-09-24 LAB — RAPID URINE DRUG SCREEN, HOSP PERFORMED
Amphetamines: NOT DETECTED
Barbiturates: NOT DETECTED
Barbiturates: NOT DETECTED
Cocaine: NOT DETECTED
Opiates: NOT DETECTED

## 2010-09-24 LAB — COMPREHENSIVE METABOLIC PANEL
ALT: 77 U/L — ABNORMAL HIGH (ref 0–53)
Alkaline Phosphatase: 103 U/L (ref 39–117)
CO2: 23 mEq/L (ref 19–32)
GFR calc non Af Amer: >60 mL/min (ref >60)
Glucose, Bld: 133 mg/dL — ABNORMAL HIGH (ref 70–99)
Potassium: 3.9 mEq/L (ref 3.5–5.1)
Sodium: 133 mEq/L — ABNORMAL LOW (ref 135–145)
Total Protein: 7.8 g/dL (ref 6.0–8.3)

## 2010-09-24 LAB — DIFFERENTIAL
Basophils Absolute: 0.1 10*3/uL (ref 0.0–0.1)
Basophils Relative: 1 % (ref 0–1)
Eosinophils Absolute: 0.2 10*3/uL (ref 0.0–0.7)
Eosinophils Absolute: 0.1 10*3/uL (ref 0.0–0.7)
Eosinophils Relative: 2 % (ref 0–5)
Eosinophils Relative: 1 % (ref 0–5)
Monocytes Absolute: 0.9 10*3/uL (ref 0.1–1.0)
Monocytes Relative: 8 % (ref 3–12)
Neutrophils Relative %: 64 % (ref 43–77)

## 2010-09-24 LAB — BASIC METABOLIC PANEL
BUN: 14 mg/dL (ref 6–23)
CO2: 23 mEq/L (ref 19–32)
Chloride: 111 mEq/L (ref 96–112)
Glucose, Bld: 118 mg/dL — ABNORMAL HIGH (ref 70–99)
Potassium: 3.6 mEq/L (ref 3.5–5.1)

## 2010-09-24 LAB — URINALYSIS, ROUTINE W REFLEX MICROSCOPIC
Bilirubin Urine: NEGATIVE
Nitrite: NEGATIVE
Specific Gravity, Urine: 1.028 (ref 1.005–1.030)
pH: 6 (ref 5.0–8.0)

## 2010-09-24 LAB — PROTIME-INR: INR: 1 (ref 0.00–1.49)

## 2010-09-24 LAB — ETHANOL: Alcohol, Ethyl (B): <5 mg/dL (ref 0–10)

## 2010-09-24 LAB — SALICYLATE LEVEL: Salicylate Lvl: <4 mg/dL (ref 2.8–20.0)

## 2010-09-25 LAB — CBC
HCT: 39.3 % (ref 39.0–52.0)
Hemoglobin: 13.1 g/dL (ref 13.0–17.0)
Hemoglobin: 14.1 g/dL (ref 13.0–17.0)
MCV: 92.6 fL (ref 78.0–100.0)
RBC: 4.05 MIL/uL — ABNORMAL LOW (ref 4.22–5.81)
RBC: 4.38 MIL/uL (ref 4.22–5.81)
RDW: 13.4 % (ref 11.5–15.5)
WBC: 6.7 10*3/uL (ref 4.0–10.5)

## 2010-09-25 LAB — PROTIME-INR
INR: 1 (ref 0.00–1.49)
Prothrombin Time: 12.7 seconds (ref 11.6–15.2)
Prothrombin Time: 13.3 seconds (ref 11.6–15.2)

## 2010-09-25 LAB — CARDIAC PANEL(CRET KIN+CKTOT+MB+TROPI)
CK, MB: 1.5 ng/mL (ref 0.3–4.0)
Relative Index: 1 (ref 0.0–2.5)
Relative Index: 1.2 (ref 0.0–2.5)
Total CK: 130 U/L (ref 7–232)
Troponin I: 0.01 ng/mL (ref 0.00–0.06)

## 2010-09-25 LAB — DIFFERENTIAL
Basophils Absolute: 0 10*3/uL (ref 0.0–0.1)
Eosinophils Relative: 1 % (ref 0–5)
Lymphocytes Relative: 25 % (ref 12–46)
Monocytes Absolute: 0.8 10*3/uL (ref 0.1–1.0)
Monocytes Relative: 8 % (ref 3–12)
Neutro Abs: 6.7 10*3/uL (ref 1.7–7.7)

## 2010-09-25 LAB — COMPREHENSIVE METABOLIC PANEL
ALT: 108 U/L — ABNORMAL HIGH (ref 0–53)
AST: 54 U/L — ABNORMAL HIGH (ref 0–37)
CO2: 25 mEq/L (ref 19–32)
Chloride: 105 mEq/L (ref 96–112)
Creatinine, Ser: 1.26 mg/dL (ref 0.4–1.5)
GFR calc Af Amer: >60 mL/min (ref >60)
GFR calc non Af Amer: >60 mL/min (ref >60)
Glucose, Bld: 119 mg/dL — ABNORMAL HIGH (ref 70–99)
Sodium: 138 mEq/L (ref 135–145)
Total Bilirubin: 0.6 mg/dL (ref 0.3–1.2)

## 2010-09-25 LAB — POCT I-STAT, CHEM 8
BUN: 14 mg/dL (ref 6–23)
Chloride: 107 mEq/L (ref 96–112)
Creatinine, Ser: 1.3 mg/dL (ref 0.4–1.5)
Potassium: 3.6 mEq/L (ref 3.5–5.1)
Sodium: 139 mEq/L (ref 135–145)
TCO2: 22 mmol/L (ref 0–100)

## 2010-09-25 LAB — SALICYLATE LEVEL: Salicylate Lvl: <4 mg/dL (ref 2.8–20.0)

## 2010-09-25 LAB — POCT CARDIAC MARKERS
CKMB, poc: <1 ng/mL — ABNORMAL LOW (ref 1.0–8.0)
Myoglobin, poc: 69.6 ng/mL (ref 12–200)
Troponin i, poc: <0.05 ng/mL (ref 0.00–0.09)

## 2010-09-25 LAB — ETHANOL: Alcohol, Ethyl (B): <5 mg/dL (ref 0–10)

## 2010-09-25 LAB — ACETAMINOPHEN LEVEL: Acetaminophen (Tylenol), Serum: <10 ug/mL — ABNORMAL LOW (ref 10–30)

## 2010-09-25 LAB — CK TOTAL AND CKMB (NOT AT ARMC)
CK, MB: 1.7 ng/mL (ref 0.3–4.0)
Total CK: 162 U/L (ref 7–232)

## 2010-09-25 LAB — RAPID URINE DRUG SCREEN, HOSP PERFORMED
Barbiturates: NOT DETECTED
Benzodiazepines: NOT DETECTED
Cocaine: POSITIVE — AB

## 2010-09-29 LAB — ETHANOL: Alcohol, Ethyl (B): <5 mg/dL (ref 0–10)

## 2010-09-29 LAB — POCT I-STAT, CHEM 8
BUN: 21 mg/dL (ref 6–23)
Calcium, Ion: 1.23 mmol/L (ref 1.12–1.32)
Creatinine, Ser: 1.1 mg/dL (ref 0.4–1.5)
Glucose, Bld: 98 mg/dL (ref 70–99)
TCO2: 23 mmol/L (ref 0–100)

## 2010-09-29 LAB — URINALYSIS, ROUTINE W REFLEX MICROSCOPIC
Bilirubin Urine: NEGATIVE
Bilirubin Urine: NEGATIVE
Glucose, UA: NEGATIVE mg/dL
Hgb urine dipstick: NEGATIVE
Ketones, ur: NEGATIVE mg/dL
Nitrite: NEGATIVE
Nitrite: NEGATIVE
Protein, ur: NEGATIVE mg/dL
Specific Gravity, Urine: 1.023 (ref 1.005–1.030)
Specific Gravity, Urine: 1.029 (ref 1.005–1.030)
Urobilinogen, UA: 1 mg/dL (ref 0.0–1.0)
Urobilinogen, UA: 1 mg/dL (ref 0.0–1.0)
pH: 6 (ref 5.0–8.0)

## 2010-09-29 LAB — GLUCOSE, CAPILLARY
Glucose-Capillary: 100 mg/dL — ABNORMAL HIGH (ref 70–99)
Glucose-Capillary: 106 mg/dL — ABNORMAL HIGH (ref 70–99)
Glucose-Capillary: 117 mg/dL — ABNORMAL HIGH (ref 70–99)
Glucose-Capillary: 144 mg/dL — ABNORMAL HIGH (ref 70–99)
Glucose-Capillary: 144 mg/dL — ABNORMAL HIGH (ref 70–99)
Glucose-Capillary: 149 mg/dL — ABNORMAL HIGH (ref 70–99)
Glucose-Capillary: 154 mg/dL — ABNORMAL HIGH (ref 70–99)
Glucose-Capillary: 160 mg/dL — ABNORMAL HIGH (ref 70–99)
Glucose-Capillary: 167 mg/dL — ABNORMAL HIGH (ref 70–99)
Glucose-Capillary: 174 mg/dL — ABNORMAL HIGH (ref 70–99)
Glucose-Capillary: 174 mg/dL — ABNORMAL HIGH (ref 70–99)
Glucose-Capillary: 179 mg/dL — ABNORMAL HIGH (ref 70–99)
Glucose-Capillary: 183 mg/dL — ABNORMAL HIGH (ref 70–99)
Glucose-Capillary: 198 mg/dL — ABNORMAL HIGH (ref 70–99)
Glucose-Capillary: 199 mg/dL — ABNORMAL HIGH (ref 70–99)
Glucose-Capillary: 202 mg/dL — ABNORMAL HIGH (ref 70–99)
Glucose-Capillary: 207 mg/dL — ABNORMAL HIGH (ref 70–99)
Glucose-Capillary: 217 mg/dL — ABNORMAL HIGH (ref 70–99)
Glucose-Capillary: 223 mg/dL — ABNORMAL HIGH (ref 70–99)
Glucose-Capillary: 267 mg/dL — ABNORMAL HIGH (ref 70–99)
Glucose-Capillary: 292 mg/dL — ABNORMAL HIGH (ref 70–99)

## 2010-09-29 LAB — POCT CARDIAC MARKERS
CKMB, poc: <1 ng/mL — ABNORMAL LOW (ref 1.0–8.0)
Myoglobin, poc: 106 ng/mL (ref 12–200)
Troponin i, poc: <0.05 ng/mL (ref 0.00–0.09)
Troponin i, poc: <0.05 ng/mL (ref 0.00–0.09)

## 2010-09-29 LAB — DIFFERENTIAL
Lymphocytes Relative: 12 % (ref 12–46)
Lymphs Abs: 1.7 10*3/uL (ref 0.7–4.0)
Monocytes Absolute: 0.8 10*3/uL (ref 0.1–1.0)
Monocytes Relative: 6 % (ref 3–12)
Neutro Abs: 12 10*3/uL — ABNORMAL HIGH (ref 1.7–7.7)

## 2010-09-29 LAB — COMPREHENSIVE METABOLIC PANEL
Albumin: 4.2 g/dL (ref 3.5–5.2)
Alkaline Phosphatase: 91 U/L (ref 39–117)
BUN: 20 mg/dL (ref 6–23)
Creatinine, Ser: 1.38 mg/dL (ref 0.4–1.5)
Potassium: 3.8 mEq/L (ref 3.5–5.1)
Total Protein: 7.6 g/dL (ref 6.0–8.3)

## 2010-09-29 LAB — CBC
HCT: 43.5 % (ref 39.0–52.0)
Platelets: 215 10*3/uL (ref 150–400)
RDW: 13 % (ref 11.5–15.5)

## 2010-09-29 LAB — CALCIUM: Calcium: 9.2 mg/dL (ref 8.4–10.5)

## 2010-09-29 LAB — CK TOTAL AND CKMB (NOT AT ARMC): Relative Index: 1.4 (ref 0.0–2.5)

## 2010-09-29 LAB — HEMOGLOBIN A1C
Hgb A1c MFr Bld: 6.2 % — ABNORMAL HIGH (ref 4.6–6.1)
Mean Plasma Glucose: 128 mg/dL

## 2010-09-29 LAB — PROTIME-INR: INR: 0.9 (ref 0.00–1.49)

## 2010-09-29 LAB — RAPID URINE DRUG SCREEN, HOSP PERFORMED
Amphetamines: NOT DETECTED
Tetrahydrocannabinol: NOT DETECTED

## 2010-09-30 LAB — BASIC METABOLIC PANEL
CO2: 20 mEq/L (ref 19–32)
Chloride: 101 mEq/L (ref 96–112)
GFR calc Af Amer: >60 mL/min (ref >60)
Potassium: 3.8 mEq/L (ref 3.5–5.1)
Sodium: 133 mEq/L — ABNORMAL LOW (ref 135–145)

## 2010-09-30 LAB — DIFFERENTIAL
Eosinophils Absolute: 0 10*3/uL (ref 0.0–0.7)
Eosinophils Relative: 0 % (ref 0–5)
Lymphs Abs: 1.7 10*3/uL (ref 0.7–4.0)
Monocytes Absolute: 1 10*3/uL (ref 0.1–1.0)
Monocytes Relative: 8 % (ref 3–12)
Neutrophils Relative %: 79 % — ABNORMAL HIGH (ref 43–77)

## 2010-09-30 LAB — ETHANOL: Alcohol, Ethyl (B): <5 mg/dL (ref 0–10)

## 2010-09-30 LAB — CBC
HCT: 44.1 % (ref 39.0–52.0)
Hemoglobin: 15.5 g/dL (ref 13.0–17.0)
MCV: 92.2 fL (ref 78.0–100.0)
RBC: 4.78 MIL/uL (ref 4.22–5.81)
WBC: 13.6 10*3/uL — ABNORMAL HIGH (ref 4.0–10.5)

## 2010-09-30 LAB — TRICYCLICS SCREEN, URINE: TCA Scrn: NOT DETECTED

## 2010-09-30 LAB — RAPID URINE DRUG SCREEN, HOSP PERFORMED
Amphetamines: NOT DETECTED
Opiates: NOT DETECTED
Tetrahydrocannabinol: NOT DETECTED

## 2010-09-30 LAB — POCT CARDIAC MARKERS

## 2010-10-28 NOTE — H&P (Signed)
[name], [name]              ACCOUNT NO.:  [id number]   MEDICAL RECORD NO.:  [id number]          PATIENT TYPE:  IPS   LOCATION:  0401                          [facility]ILITY[facility]  [facility]   PHYSICIAN:  Anselm Jungling, MD  DATE OF BIRTH:  07-28-1965   DATE OF ADMISSION:  10/08/2008  DATE OF DISCHARGE:                       PSYCHIATRIC ADMISSION ASSESSMENT   IDENTIFYING INFORMATION:  A 45 year old, African American male.  This is  a voluntary admission.   HISTORY OF PRESENT ILLNESS:  This is the 5th [name] admission this year for  [name] who presents complaining of continued depression with suicidal  thoughts and a plan to cut himself.  Recently discharged from [name] April 15 after a 10-day stay after inflicting a superficial burn  on his abdomen in a suicide attempt.  He has not relapsed on cocaine.  Said that he had been trying to get into a program for drug rehab, had  not had any luck, and was feeling hopeless about the future.  Lives  alone and was ruminating about his situation and having suicidal  thoughts.  He denies any hallucinations.  He denies relapsing on drugs  or alcohol.   PAST PSYCHIATRIC HISTORY:  Has a history of several admissions at [name].  Has a history of suicide attempts including a self-inflicted burn and  intentional overdose including attempting to overdose on cocaine.  Has  been followed at [name].  Has had trials of both  [name] and [name].   SOCIAL HISTORY:  A 45 year old male, lives alone, is unemployed, has  very basic education but says that he cannot read or write.  Married and  divorced once.  Last employment was in 2003.  He is in supported housing  and reports that he can return there.   FAMILY HISTORY:  Denies family history of mental illness or substance  abuse.   PAST MEDICAL HISTORY:  Previously seen at [name].   CURRENT MEDICAL PROBLEMS:  1. Coronary artery disease.  2. History of atypical chest pain.  3.  History of percutaneous stent placement.   CURRENT MEDICATIONS:  Current medications when he was most recently  discharged include:   1. [name] 75 mg daily.  2. [name] 120 mg daily.  3. He was on [name] 60 mg daily.  4. [name] 20 mg daily.  5. [name] 40 mg daily.  6. [name] 400 mg q.h.s.  7. [name] and [name] had previously been discontinued.   DRUG ALLERGIES:  [name], [name], and [name].  [name]  caused angioedema.   PHYSICAL EXAMINATION:  Physical exam was done in the [name] as  noted in the record.  This is generally a healthy, tall, large built  male in no distress.   DIAGNOSTIC STUDIES:  Remarkable for aspirin and acetaminophen levels  that were negative.  Routine urinalysis unremarkable.  UDS negative for  all substances.  Alcohol level less than 5.  Comprehensive metabolic  panel with mild elevations in his liver enzymes, SGOT 44, SGPT 77,  alkaline phosphatase 103, and total bilirubin 0.6.  CBC within normal  limits.   MENTAL STATUS EXAM:  Reveals a fully alert male, cooperative, poor eye  contact.  Mood and affect mildly irritable, but generally, he is  cooperative, fully alert, gives a coherent history.  No signs of  internal distraction.  No delusional statements made.  Endorses having  suicidal thoughts, particularly when he is at home, not being in a  program, having a lot of unstructured time.  He feels like that is a  major stressor for him.  Wanting to be in a program.  Feels like without  better structure to his time, his sobriety is in danger.  Endorses  having suicidal thoughts today without specific plan mostly passive  suicidal thoughts.  No homicidal thought.  Cognition is preserved.  Insight adequate.   AXIS I:  Major depression recurrent severe, cocaine abuse in partial  remission.  AXIS II:  Deferred.  AXIS III:  Coronary artery disease.  AXIS IV:  Severe issues with excess idle time.  AXIS V:  Current is 44, past  year 64 estimated.   Plan is to voluntarily admit him to stabilize his depression.  We hope  to try to get him into a specific program to alleviate his stressors of  lack of structure time.  We are going to continue his routine  medications at this point.  We will continue his [name] as is.  He is  in our Dual Diagnosis Program.      [name]. Lorin Picket, N.P.      Anselm Jungling, MD  Electronically Signed    MAS/MEDQ  D:  10/09/2008  T:  10/09/2008  Job:  [id number]

## 2010-10-28 NOTE — Consult Note (Signed)
NAMEWOOD, NOVACEK NO.:  [id number]   MEDICAL RECORD NO.:  [id number]          PATIENT TYPE:  INP   LOCATION:  4736                         FACILITY:  [facility]   PHYSICIAN:  Antonietta Breach, M.D.  DATE OF BIRTH:  July 25, 1965   DATE OF CONSULTATION:  09/03/2008  DATE OF DISCHARGE:                                 CONSULTATION   REFERRING PHYSICIAN:  [facility].   REASON FOR CONSULTATION:  Depression, suicide attempt, cocaine relapse  using rat poison   HISTORY OF PRESENT ILLNESS:  Mr. Fiallos has had severe depression when  he is not cocaine high.  He just had a cocaine relapse this past  weekend, and it is Monday.   He does have ongoing depressed mood, anhedonia, poor energy, poor  concentration, thoughts of hopelessness as well as suicide intent; not  currently having any hallucinations or delusions.  His memory and  orientation function are intact.  He is cooperative with staff care and  is noncombative.  He is motivated to continue recovery tools as well as  treatment for depression.   He has been receiving general medical care for his rat poison overdose.  His ongoing general supportive environment in the hospital has not  resulted in a reversal of his depression.   PAST PSYCHIATRIC HISTORY:  He does not have a history of increased  energy or decreased need for sleep when he is not on a substance abuse  stimulating regimen.   He has a history of at least 10 suicide attempts, and has been admitted  to [facility] in the past.   Mr. Ruhland was just recently discharged from the [facility] after an intentional overdose, and in that overdose he used a  variety of pills during a severe depression.   FAMILY PSYCHIATRIC HISTORY:  None known.   SOCIAL HISTORY:  Mr. Gahan does have 5 kids that are grown.  He is  divorced.  He lives by himself.   SUBSTANCE HISTORY:  He has attended 12-step groups in the past.  He does  have a  history of regular intense cocaine use over several years, and  most recently relapsed after a period of abstinence.   PAST MEDICAL HISTORY:  1. Mr. Ballon is status post rat poisoning ingestion.  2. Hypertension.  3. Coronary artery disease.  4. History of coronary artery stent placement.   MEDICATIONS:  His MAR is reviewed.  1. He currently is on a nicotine patch 21 mg daily.  2. Thiamine 100 mg daily.   ALLERGIES:  1. CAPOTEN.  2. PENICILLIN.  3. AMOXICILLIN.   LABORATORY DATA:  Sodium 132.  BUN is 11, creatinine 1.26, glucose 119.  SGOT 54, SGPT 108.   WBC 6.7, hemoglobin 13.1, platelet count 188.  His current INR is 1.0.  Aspirin negative, Tylenol negative.  Urine drug screen positive for  cocaine consistent with his history.  Alcohol negative.   REVIEW OF SYSTEMS:  Constitutional, head, eyes, ears, nose, throat,  mouth, neurologic, psychiatric, cardiovascular, respiratory,  gastrointestinal, genitourinary, skin, musculoskeletal, hematologic,  lymphatic, endocrine, metabolic all unremarkable.   PHYSICAL  EXAMINATION:  VITAL SIGNS:  Temperature 96.6, pulse 56,  respiratory rate 20, blood pressure 142/94, O2 saturation on room air  98%.  GENERAL APPEARANCE:  Mr. Feutz is a middle-aged male lying in a supine  position in his hospital bed with no abnormal involuntary movements.   MENTAL STATUS EXAM:  Mr. Vandenbrink does have intact eye contact.  His  attention span is slightly decreased, concentration is decreased.  His  affect is profoundly constricted.  Mood is extremely depressed.  He is  oriented completely to all spheres.  Memory is intact to immediate  recent and remote.  Fund of knowledge and intelligence are within normal  limits.  Speech involves a slightly flat prosody.  There is no  dysarthria.  He does have a normal rate of speech.   Thought process logical, coherent, goal-directed.  No looseness of  associations.  Thought content:  He has ongoing suicidal  thoughts,  profound thoughts of hopelessness and helplessness.  He has no delusions  or hallucinations.   His insight is intact, judgment is intact for informed consent regarding  further psychiatric care.  However, his judgment is impaired for self-  care outside of an institution.   ASSESSMENT:  Axis I:  1.  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  2.  296.33, major  depressive disorder recurrent severe.  3.  Cocaine dependence.  Axis II:  Deferred.  Axis III:  See past medical history.  Axis IV:  Primary support group, general medical.  Axis V:  25.   Mr. Tammen is still at risk for suicide.   RECOMMENDATIONS:  Would continue suicide precautions.   RECOMMENDATIONS:  Will defer psychotropic medication for now except for  Vistaril 25-50 mg p.o. q.h.s. p.r.n. insomnia.  When utilizing the  Vistaril, would exercise caution regarding dry mouth or excessive  sedation.   Would continue to reinforce 12-step principles.   Would admit to an inpatient psychiatric unit for a dual diagnosis  track where his cocaine dependence and depression can be addressed.      Antonietta Breach, M.D.  Electronically Signed     JW/MEDQ  D:  09/03/2008  T:  09/03/2008  Job:  [id number]   cc:   [facility]

## 2010-10-28 NOTE — Discharge Summary (Signed)
NAMEAUSTEN, [name]              ACCOUNT NO.:  [id number]   MEDICAL RECORD NO.:  [id number]          PATIENT TYPE:  INP   LOCATION:  3017                         FACILITY:  [facility]   PHYSICIAN:  Isidor Holts, M.D.  DATE OF BIRTH:  Aug 13, 1965   DATE OF ADMISSION:  09/02/2008  DATE OF DISCHARGE:  09/04/2008                               DISCHARGE SUMMARY   PRIMARY MEDICAL DOCTOR:  Gentry Fitz.   DISCHARGE DIAGNOSES:  1. Polysubstance ingestion, ie, rat poison, cocaine and beer.  2. Suicide attempt.  3. Major depression.  4. History of coronary artery disease, status post percutaneous      transluminal coronary angioplasty and stent.  5. History of congestive cardiomyopathy.  6. Hypertension.  7. History of schizophrenia.  8. History of previous suicide attempts.   DISCHARGE MEDICATIONS:  1. Plavix 75 mg p.o. daily.  2. Lisinopril 20 mg p.o. daily.  3. Cardizem CD 120 mg p.o. daily.  4. Nicoderm CQ Patch (21mg /24 hour) 1 patch to skin daily.  5. Protonix 40 mg p.o. daily.  6. Vistaril 25 mg p.o. p.r.n. q.h.s. for insomnia.   Note:  Folic acid, Lexapro, Cardizem, Seroquel, Celexa, Paxil, have all  been discontinued until re-evaluated by psychiatrist.   PROCEDURE:  None.   CONSULTATIONS:  Antonietta Breach, M.D., psychiatrist.   ADMISSION HISTORY:  As in H and P notes of September 02, 2008, dictated by  Dr. Della Goo.  However in brief, this is a 45 year old male,  with known history of depression, schizophrenia, history of previous  suicide attempt, coronary artery disease status post PTCA and stents,  congestive cardiomyopathy, hypertension, presenting to the emergency  department, stating that he had taken 4 teaspoons of liquid rat poison,  cocaine and 1 can of beer, in an attempt to commit suicide, reportedly  at about midnight.  He was admitted for further investigation,  evaluation and management.   CLINICAL COURSE:  1. Polysubstance ingestion.  The patient  presents as mentioned above.      Clearly appeared depressed and was placed on 1:1 sitter.      Laboratory studies were done.  Salicylate was less than 4,      acetaminophen less than 10, alcohol level was less than 5.  Labs      appeared normal with sodium 139, potassium 3.6, chloride 107,      bicarbonate 22, BUN 14, creatinine 1.3, glucose 103, INR was normal      at 1.0.  [facility] was contacted.  Vitamin K was      recommended and also serial INR monitoring for a total of 3 days.      There was steady diminution in patient's INR levels.  As of September 04, 2008, INR was 0.9.  The patient had no untoward symptoms during      the course of his hospitalization.  Certainly, no evidence of      bleeding or abdominal pain.  Telemetry managing showed no evidence      of arrhythmia.  By September 04, 2008, he was considered clinically  stable for a psychiatric evaluation.  Psychiatry consultation was      kindly provided by Dr. Antonietta Breach.  For details, refer to the      consultation notes of September 03, 2008.  He has opined that patient      does have major depression, cocaine dependence and has stated that      patient is still at risk for suicide.  He has recommended further      inpatient management at the [facility], when patient      is medically stable.   1. Coronary artery disease.  The patient was asymptomatic from this      viewpoint.   1. Smoking history.  The patient was counseled appropriately and      managed with Nicoderm CQ Patch.   1. Hypertension.  The patient has remained normotensive during the      course of his hospitalization, on a combination of ACE inhibitor      and calcium channel blocker.   DISPOSITION:  The patient was on September 04, 2008 asymptomatic and  considered clinically stable for discharge to [facility].  He has therefore been medically cleared and discharged accordingly.   DIET:  Heart  healthy.   ACTIVITY:  As tolerated.   FOLLOW UP:  The patient is recommended to follow up with his primary MD  routinely, following discharge.  Psychiatric followup will be arranged  by psychiatrist at the appropriate time.      Isidor Holts, M.D.  Electronically Signed     CO/MEDQ  D:  09/04/2008  T:  09/04/2008  Job:  [id number]

## 2010-10-28 NOTE — Discharge Summary (Signed)
NAMEDRAYK, HUMBARGER NO.:  [id number]   MEDICAL RECORD NO.:  [id number]          PATIENT TYPE:  IPS   LOCATION:  0301                          [facility]  [facility]   PHYSICIAN:  Jasmine Pang, M.D. DATE OF BIRTH:  [birth year]   DATE OF ADMISSION:  09/04/2008  DATE OF DISCHARGE:  09/11/2008                               DISCHARGE SUMMARY   IDENTIFICATION:  This is a 45 year old single African American male who  was admitted on a voluntary basis on September 04, 2008.   HISTORY OF PRESENT ILLNESS:  The patient is here after being medically  stable.  He had ingested approximately 3 tablespoons of rat poisoning.  He was also using cocaine and beer.  He states he is depressed and  walked from his home to the emergency room to get some help.  For  further admission information, see psychiatric admission assessment.   PHYSICAL EXAM:  The patient was fully assessed at [facility].  Exam was  reviewed with no significant findings.  He appeared in no acute  distress.   LABORATORY DATA:  INR of 0.9.  AST was elevated at 54 with a reference  range of 0 to 37.  ALT was 108 with reference range of 0 to 53.  Hemoglobin was 13.1, hematocrit 37.5.  Alcohol level less than 5.  Salicylate level less than 4.  Acetaminophen level less than 10.  Urine  drug screen was positive for cocaine.   HOSPITAL COURSE:  Upon admission, the patient was continued on his home  medications of Seroquel 400 mg p.o. q.h.s. and trazodone 100 mg p.o.  q.h.s.  He was also continued on his home medications of Plavix 75 mg  daily, lisinopril 20 mg daily, Cardizem CD 120 mg daily, Protonix 40 mg  daily, Vistaril 25 mg q.6 h. p.r.n. anxiety, trazodone 50 mg p.o. q.h.s.  p.r.n. insomnia.  In individual sessions, the patient was quite  disheveled initially.  He had minimal eye contact.  There was  psychomotor retardation.  Speech was soft and slow.  Mood was depressed  and anxious.  Affect was consistent with mood.   Thought processes were  logical and goal-directed.  There was no evidence of psychosis.  His  judgment was felt to be poor.  Insight was absent.  As hospitalization  progressed, he became less depressed and less anxious.  His sleep was  good and appetite was good.  He stated he is looking at support groups  for when he leaves.  On September 08, 2008, mood was calm.  Affect was  consistent with mood.  He denied suicidal or homicidal ideation.  On  September 11, 2008, the patient plans for discharge.  He was feeling much  better and had no dangerous thoughts.  He requests follow up with  [facility].  He requests a 1-week supply of medicines  until he get enough together for copayment.  His mental status exam  revealed that he was fully alert and full contact with reality.  Mood  was less depressed, less anxious.  Affect consistent with mood.  Thinking was logical and relevant.  There were no dangerous ideas.  No  thought disorder or psychosis noted.  Cognitive was grossly intact.  Insight fair.  Judgment fair.  Impulse control good.  It was felt the  patient was safe for discharge.   DISCHARGE DIAGNOSES:  Axis I:  Mood disorder, not otherwise specified,  polysubstance abuse.  Axis II:  None.  Axis III:  Coronary artery disease, hypertension, congestive heart  failure.  Axis IV:  Medical problems, other psychosocial problems related to  chronic substance use, noncompliance, poor coping skills, lack of social  support.  Axis V:  Global assessment of functioning was 35 upon admission, GAF was  50 upon discharge, GAF highest past year was 65.   DISCHARGE PLANS:  There was no specific activity level or dietary  restrictions.   POSTHOSPITAL CARE PLANS:  The patient will go to [facility] on  April 8th at 1 p.m.  He will also go to [facility] for substance  abuse counseling.   DISCHARGE MEDICATIONS:  1. Aspirin 81 mg daily.  2. Plavix 75 mg daily.  3. Lisinopril 20 mg  daily.  4. Diltiazem 120 mg daily.  5. Seroquel 400 mg at bedtime.  6. Trazodone 50 to 100 mg at bedtime as needed.  7. Lidocaine patch to back as needed.  8. Protonix 40 mg daily.      Jasmine Pang, M.D.  Electronically Signed     BHS/MEDQ  D:  09/24/2008  T:  09/25/2008  Job:  [id number]

## 2010-10-28 NOTE — H&P (Signed)
NAMEHANSFORD, [name] NO.:  [id number]   MEDICAL RECORD NO.:  [id number]          PATIENT TYPE:  IPS   LOCATION:  0301                          [facility]ILITY[facility]  [facility]   PHYSICIAN:  Jasmine Pang, M.D. DATE OF BIRTH:  10/27/1965   DATE OF ADMISSION:  09/04/2008  DATE OF DISCHARGE:                       PSYCHIATRIC ADMISSION ASSESSMENT   This is a 45 year old male voluntarily admitted on September 04, 2008.   HISTORY OF PRESENT ILLNESS:  The patient is here after being medically  stable after he ingested approximately 3 tablespoons of right atrium  poison, also using cocaine and drinking beer.  He states he is tired  and stressed out, having no income, and was having thoughts that the  overdose would just make him go to sleep and not wake up.  He walked  from his home to the emergency room to get some help.   PAST PSYCHIATRIC HISTORY:  The patient has had several other admissions.  He was last here in February of 2010 for intentional overdose and  suicidal thoughts and using cocaine.  He is a client at [facility].   SOCIAL HISTORY:  He is a 45 year old male who lives alone in  [place].  Denies any legal problems.   FAMILY HISTORY:  None.   ALCOHOL AND DRUG HISTORY:  The patient has been using cocaine and  drinking alcohol on occasion.  No history of seizures or blackouts.   PRIMARY CARE Elery Cadenhead:  [facility].   MEDICAL PROBLEMS:  1. History of coronary artery disease.  2. Hypertension.  3. Congestive heart failure.   MEDICATIONS:  The patient was discharged on:  1. Plavix 75 mg daily.  2. Lisinopril 20 daily.  3. Cardizem CD 120 mg daily.  4. Protonix 40 mg daily.  5. Vistaril p.r.n. for insomnia.   DRUG ALLERGIES:  1. PENICILLIN - PROBLEMS WITH SWELLING.  2. AMOXICILLIN - PROBLEMS WITH SWELLING.   PHYSICAL EXAMINATION:  GENERAL:  This is a middle-aged male.  He is  unkempt in appearance.  He was fully assessed over at [facility];  exam  is reviewed with no significant findings.  He denies any complaints  today.  He appears in no acute distress.  VITAL SIGNS:  Temperature of 97, heart rate 62, respirations 20, blood  pressure is 149/104, height 6 feet tall, 236 pounds.   LABORATORY DATA:  INR of 0.9.  AST is elevated at 54 with a reference  range of 0-37.  ALT is 108 with a reference range of 0-53.  Hemoglobin  13.1, hematocrit of 37.5.  Alcohol level less than 5.  Salicylate less  than 4.  Acetaminophen level less than 10.  Urine drug screen was  positive for cocaine.   MENTAL STATUS EXAM:  The patient was resting in bed but did get up and  participate.  Again, he is unkempt.  Had poor eye contact.  Offers  little information.  Speech is soft-spoken.  Mood is depressed.  The  patient again appears flat.  Thought process are coherent, goal  directed.  No delusional statements made.  He does not appear to  be  responding to internal stimuli.  Memory appears intact.  Judgment and  insight are poor.  He is somewhat evasive about events and  circumstances.   IMPRESSION:  AXIS I:  Mood disorder.  Polysubstance abuse.  AXIS II:  Deferred.  AXIS III:  Coronary artery disease, hypertension, congestive heart  failure.  AXIS IV:  Medical problems.  Other psychosocial problems related to  chronic substance use, noncompliance, poor coping skills, lack of social  support.  AXIS V:  Current is 35.   PLAN:  Our plan is to contract for safety.  We will resume his  medication regime.  We will continue with the patient's discharge  medications.  The patient will need to follow up with his medical  issues.  The patient will be in the dual diagnosis group.  Again, will  identify his support group and comorbidities.  His tentative length of  stay at this time is 3-5 days.      Landry Corporal, N.P.      Jasmine Pang, M.D.  Electronically Signed    JO/MEDQ  D:  09/05/2008  T:  09/05/2008  Job:  [id number]
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
## 2010-10-28 NOTE — Discharge Summary (Signed)
[name], [name]              ACCOUNT NO.:  [id number]   MEDICAL RECORD NO.:  [id number]          PATIENT TYPE:  IPS   LOCATION:  0401                          FACILITY:  BH   PHYSICIAN:  Anselm Jungling, MD  DATE OF BIRTH:  1966/05/01   DATE OF ADMISSION:  10/08/2008  DATE OF DISCHARGE:  10/15/2008                               DISCHARGE SUMMARY   IDENTIFYING DATA AND REASON FOR ADMISSION:  This is one of many  inpatient psychiatric admissions for [name], a 45 year old unmarried  African American male with a history of substance abuse, and mood  disorder.  Please refer to the admission note for further details  pertaining to the symptoms, circumstances and history that led to his  hospitalization.  He was given initial Axis I diagnosis of major  depressive disorder, recurrent, severe, and cocaine abuse.   MEDICAL AND LABORATORY:  The patient was medically and physically  assessed by the psychiatric nurse practitioner.  He came to [place] with a  history of coronary artery disease, atypical chest pain, and  percutaneous stent placement.  He was continued on his usual regimen of  Plavix, Diltiazem, Zestril, and Protonix.  There were no significant  medical issues during this stay.   HOSPITAL COURSE:  The patient was admitted to the adult inpatient  psychiatric service.  He presented as a well-nourished, normally-  developed adult male who was depressed, but otherwise pleasant and  cooperative.  He was somewhat hopeless.  He denied any active suicidal  ideation and verbalized a strong desire for help.   There was one medical event during his stay which consisted of a sudden  onset of swelling of the posterior pharynx.  He was sent to the  emergency department for this and treated appropriately with medications  for a very strong and vigorous allergic reaction, of an anaphylactic  quality.  He was treated with steroids as well as antihistamines and  returned to the inpatient  psychiatry service in good condition.  It was  never clear what triggered this event.  It did not reoccur.   The patient participated in various therapeutic groups and activities.  He worked closely with care Production designer, theatre/television/film towards an aftercare plan.  He  appeared appropriate for discharge on the seventh hospital day.  He  agreed to the following aftercare plan.   AFTERCARE:  The patient was to follow up at [facility] with an appointment to see their psychiatrist on May 18 at 9:30  a.m.  He was also to follow up at [facility] for intensive outpatient  chemical dependency programming, to begin any Monday, Wednesday or  Friday at 9:00 a.m.   DISCHARGE MEDICATIONS:  1. Trazodone 100 mg q.h.s.  2. Cymbalta 60 mg daily.  3. Plavix 75 mg daily.  4. Protonix 40 mg daily.  5. Seroquel 1 mg q.h.s.  6. Aspirin 81 mg daily.  7. Diltiazem 1 tablet twice daily.   DISCHARGE DIAGNOSES:  AXIS I:  1. Polysubstance abuse, early remission.  2. Schizoaffective disorder not otherwise specified.  AXIS II:  Deferred.  AXIS III:  History of coronary artery disease, gastroesophageal reflux  disease.  AXIS IV:  Stressors severe.  AXIS V:  GAF on discharge 55.      Anselm Jungling, MD  Electronically Signed     SPB/MEDQ  D:  10/16/2008  T:  10/16/2008  Job:  [id number]
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
## 2010-10-28 NOTE — H&P (Signed)
[name], [name] NO.:  [id number]   MEDICAL RECORD NO.:  [id number]          PATIENT TYPE:  IPS   LOCATION:  0301                          FACILITY:  BH   PHYSICIAN:  Jasmine Pang, M.D. DATE OF BIRTH:  [birth year]   DATE OF ADMISSION:  07/26/2008  DATE OF DISCHARGE:                       PSYCHIATRIC ADMISSION ASSESSMENT   This is a 45 year old male voluntarily admitted July 26, 2008.   HISTORY OF PRESENT ILLNESS:  The patient is here with intentional  overdose, overdosing on a variety of his pills, feeling depressed and  suicidal.  Denies any specific stressors, although stating that he does  not have a life and also using cocaine.   PAST PSYCHIATRIC HISTORY:  The patient was here at the end of January  for an overdose at that time and was positive for cocaine.  His follow  up was to be at the [facility].   SOCIAL HISTORY:  The patient states that he does have his own place and  is unemployed.   FAMILY HISTORY:  Unknown.   ALCOHOL/DRUG HISTORY:  No current alcohol use, but again has been using  cocaine.   PRIMARY CARE [name]:  Unclear.   MEDICAL PROBLEMS:  1. History of hypertension.  2. History of atypical chest pain.  3. Coronary artery disease.  4. The patient is also status post percutaneous coronary intervention      with stent.   MEDICATIONS:  1. Recently prescribed Plavix 75 mg daily.  2. Folic acid 1 mg daily.  3. Lisinopril 20 mg daily.  4. Diltiazem ER 120 mg daily.  5. Celexa 40 mg.  6. Seroquel 400 mg at bedtime.  Compliance with medications is      unclear.   DRUG ALLERGIES:  1. PENICILLIN.  2. AMOXICILLIN.  3. CAPTOPRIL with side effect of angioedema.   PHYSICAL EXAMINATION:  GENERAL:  The patient was fully assessed at [facility] Emergency Department.  Physical exam was reviewed with no  significant findings.  The patient did receive charcoal for the  overdose.  VITAL SIGNS:  Temperature 97.4, 111 heart  rate, 12 respirations, blood  pressure is 158/97, 98% saturated, 233 pounds, 6 feet 1 inch tall.   LABORATORY DATA:  White count of 13.6.  Alcohol level less than 5.  Acetaminophen level less than 10.  Urine drug screen is positive for  cocaine.  Glucose of 131.   MENTAL STATUS EXAM:  The patient at this time is resting in bed, arouses  momentarily and very sleepy.  Speech is soft spoken and offers little  information.  Thought processes seem to be coherent and goal directed.  Cognitive functioning intact.  Memory appears intact.  Judgment and  insight are poor.   AXIS I:  Substance induced mood disorder, cocaine abuse, rule out  dependence.  AXIS II:  Deferred.  AXIS III:  Hypertension, coronary artery disease.  AXIS IV:  Psychosocial problems, medical problems, chronic substance  use.  AXIS V:  Current is 35.   PLAN:  Our plan is to stabilize mood and thinking.  We will continue to  address substance  use.  The patient may benefit from a long-term rehab.  He will be in the dual diagnosis program.  We will reinforce medication  compliance and follow up.  His tentative length of stay at this time is  3-5 days.      [name], N.P.      Jasmine Pang, M.D.  Electronically Signed    JO/MEDQ  D:  07/27/2008  T:  07/27/2008  Job:  [id number]

## 2010-10-28 NOTE — Discharge Summary (Signed)
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  He  indicated that he was anxious about becoming homeless.  He had been on a  regimen of Seroquel and trazodone.  Please refer to the admission note  for further details pertaining to the symptoms, circumstances and  history that led to his hospitalization.  He was given an initial Axis I  diagnosis of major depressive disorder, recurrent.   MEDICAL AND LABORATORY:  The patient was medically and physically  assessed by the psychiatric nurse practitioner.  He came to [place] with a  history of GERD, hyperlipidemia, and hypertension.  He was medically and  physically assessed by the psychiatric nurse practitioner.  He was given  his usual aspirin, Protonix, diltiazem, and Plavix.  He was given dose  of lisinopril, to which he had a mild anaphylactoid reaction that was  treated immediately with IM Benadryl, and a trip to the emergency room.  There were no sequelae from this episode.  His chart was subsequently  marked allergic to lisinopril, and allergic to ACE inhibitors.  There  were no other significant medical issues.   HOSPITAL COURSE:  The patient was admitted to the adult inpatient  psychiatric service.  He presented as a well-nourished, normally-  developed male who was alert, fully oriented, pleasant, polite, but  depressed and grim.  He was  nonpsychotic.  He denied any suicidal plan  or intent, but admitted to having some thoughts.  He expressed a strong  desire for help.   He was enrolled in the milieu program and was a good participant in  therapeutic groups and activities.  He was restarted on his usual  Seroquel 400 mg q.h.s. and trazodone 100 mg p.r.n. insomnia.  These were  well tolerated.   The remainder of the patient's stay was uneventful.  Due to the  frequency of his rehospitalizations, we looked into the possibility of  an assertive community treatment team following his discharge.  He was  very enthusiastic about this possibility, and we were able to refer him  to the [facility] team post discharge.  The patient was agreeable to  discharge on the tenth hospital day.   DISCHARGE AND AFTERCARE PLAN:  The patient was to follow-up with the [facility]  ACT team psychiatrist within 10 days of discharge.  Also, as a backup,  an appointment was made at [facility] mental health with an  appointment for their psychiatrist on June 15 at 11:30 a.m.  DISCHARGE MEDICATIONS:  1. Aspirin 81 mg daily.  2. Protonix 40 mg daily.  3. Seroquel 400 mg q.h.s.  4. Diltiazem 120 mg daily.  5. Celexa 40 mg daily.  6. Trazodone 100 mg q.h.s.  7. Plavix 75 mg daily.   The patient was instructed to stop taking Lexapro, lisinopril.   DISCHARGE DIAGNOSES:  AXIS I: Schizoaffective disorder, NOS.  AXIS II: Deferred.  AXIS III: History of gastroesophageal reflux disease, hypertension.  AXIS IV: Stressors severe.  AXIS V: GAF on discharge 60.      Anselm Jungling, MD  Electronically Signed     SPB/MEDQ  D:  11/14/2008  T:  11/14/2008  Job:  [id number]

## 2010-10-28 NOTE — Discharge Summary (Signed)
NAMEACHERON, SUGG NO.:  [id number]   MEDICAL RECORD NO.:  [id number]          PATIENT TYPE:  INP   LOCATION:  4702                         FACILITY:  [facility]   PHYSICIAN:  Alvester Morin, M.D.  DATE OF BIRTH:  10-Apr-1966   DATE OF ADMISSION:  03/09/2008  DATE OF DISCHARGE:  03/12/2008                               DISCHARGE SUMMARY   PRIMARY CARE PHYSICIAN:  Dr. Tressia Danas, [facility].   CONSULTANTS:  1. Cardiology.  2. Psychiatry.   DISCHARGE DIAGNOSES:  1. Chest pain (atypical, in the setting of cocaine abuse).  2. Leukocytosis (? secondary to Trichomonas, normalized at discharge).  3. Hepatitis C (new diagnosis since February 20, 2008).  4. Trichomonas infection (resolved on discharge with Flagyl).  5. Schizophrenia, depressive disorder.  6. Polysubstance abuse (cocaine, marijuana, alcohol).  7. Tobacco abuse.  8. Hypertension.   DISCHARGE MEDICATIONS:  1. Plavix 75 mg once daily.  2. Aspirin 325 mg once daily.  3. Seroquel 100 mg daily at bedtime.  4. Trazodone 50 mg daily at bedtime.  5. Paxil 20 mg daily.  6. Folic acid 1 mg daily.  7. Cardizem CD 120 mg once daily.  8. Nicotine patch 20 mg, apply once daily.   DISPOSITION AND FOLLOWUP:  Patient was discharged from the unit in  stable condition with no active chest pain.  He is still having  occasional hallucinations but no active suicidal ideations.  He will be  transferred to [facility] for further evaluation and  management.  The patient will have followup appointment with Dr. Tressia Danas on April 11, 2008, at 10 a.m.  On the visit, check CBC with  differential since on admission there was significant leukocytosis  (white blood cells 23) with significant left shift which resolved on  transfer day.  Etiology remains unclear. Please also check CBG,  questionable diagnosis of diabetes since hemoglobin A1c equals 7.2 on  February 19, 2008.  CBG during the hospitalization  within normal limits.  Also check BMET to evaluate for creatinine and GFR since during  hospitalization creatinine was on high end of normal (on admission day).  Also mild hypokalemia on admission (potassium equals 3.4).  Evaluate  LFTs, on admission AST and ALT elevated, however trending down from  February 19, 2008.  Hepatitis panel checked at that time and was  positive for Hep C antibody, this is likely new diagnosis.  There is no  prior documented history of Hepatitis C noted.  On discharge, AST  equaled 69, ALT equaled 155.  Please follow up on abstinence from  cocaine, alcohol and tobacco as well as medication compliance.  Check  blood pressure since on discharge hydrochlorothiazide and lisinopril  were both stopped and patient maintained blood pressure within the  target range with Cardizem 240 mg daily.  However, patient noted to be  bradycardic with heart rate in mid 50s, likely secondary to Cardizem 240  mg daily and questionable compliance with medication at home.  On  discharge, Cardizem dose was decreased to 120 mg daily.  Please evaluate  if adequate control of blood pressure with one  medicine alone.   PROCEDURES PERFORMED:  March 09, 2008, chest x-ray showed right  basilar atelectasis, no edema or infiltrate.   HISTORY AND PHYSICAL:  A 45 year old male with past medical history of  CAD status post MI and stent placement in 2003, polysubstance abuse  (cocaine, alcohol THC), tobacco abuse, depression and schizophrenia,  hypertension, recently discharged from [facility] on February 28, 2008 (for schizophrenia and depression), presented to ED with two  concerns.  First hallucinating, some little dancers telling him to  take cocaine and kill himself.  He reports not using cocaine for the  past week but felt like using it to get rid of the voices.  His second  concern is sharp chest pain that also started yesterday intermittent, he  had few episodes and were not  relieved or aggravated by any specific  factors, lasting several minutes, radiating occasionally to his left  shoulder, 10/10 in severity and currently 5/10 in severity.  Also  reports cramps and swelling in lower extremities for the past week,  dysuria, hematuria, frequency and urgency for the past 3 days.  The  patient also reports cough productive of white sputum, denies fever,  chills, weakness, numbness, no sick contacts, no history of sexually  transmitted diseases.   VITALS:  Blood pressure 161/94, temperature 97.4, pulse 119,  respirations 18, saturating 99% on room air.   LABS:  Sodium 140, potassium 3.4, chloride 103, bicarb 20, BUN 16,  creatinine 1.42, glucose 159.  White blood cells 23.6, ANC 20.8,  hemoglobin 14, MCV 92, platelets 280.  UDS positive for cocaine.  AST  69, ALT 155.   PHYSICAL EXAM:  Patient lying in bed depressed appearing, not in acute  distress.  HEENT:  EOMI, sclera muddy, no conjunctival pallor, moist oral mucosa,  no sinus tenderness, no nasal discharge, no oropharyngeal erythema, no  palpable lymphadenopathy.  RESP:  Clear to auscultation bilaterally, no accessory muscle use or  breathing, speaking in full sentences, no rhonchi or rales.  CVS:  Regular rhythm, tachycardic, no murmurs, rubs or gallops, no JVD.  CHEST:  Tender to palpation.  ABDOMEN:  Soft, nondistended, lower abdominal tenderness bilaterally, no  guarding, bowel sounds present.  EXTREMITIES:  Trace pitting edema, moves all 4 extremities equally well.  NEURO:  Alert and oriented x3, slow motion.  Strength 4/5 throughout  bilaterally.  Sensation intact to soft touch and position.  PSYCH:  Depressed appearing, noncommunicative, hallucinating, no active  suicidal ideations.   HOSPITAL COURSE:  1. Atypical chest pain.  The patient's presenting symptoms of      reproducible chest pain on palpation, no nausea, vomiting,      diaphoresis are atypical but considering his history of  coronary      disease status post MI and percutaneous coronary intervention of      the OM in 2004.  We were concerned for ACS.  Patient was monitored      on telemetry.  EKG showed ST segment elevation in V2, V3, V4 and PR      interval prolongation, indication of first degree block.  Three      sets of cardiac enzymes were negative and UDS screen was positive      for cocaine.  Because of the EKG changes, cardiology was asked to      see the patient. The EKG was reviewed and cardiology felt that it      was unchanged from previous and did not represent ischemic changes.  Recent 2D echocardiogram February 21, 2008, showed left ventricular      ejection fraction of 55% and mild hypokinesis of the mid      inferoposterior wall.  Lipid profile from February 22, 2008, was      within normal limits except HDL of 27 and patient is not on any      medication for it.  We started patient back on Plavix and aspirin      and chest pain resolved by hospitalization day #2.  No beta-blocker      started due to current cocaine abuse.  2. Leukocytosis.  On admission white blood cell equaled 23 with ANC      equal to 20, resolved by hospitalization day #2.  On discharge,      white blood cells 6.9, ANC within normal limits. Patient was      afebrile during the hospitalization and work-up for infection      revealed Trichomonas infection.  3. Hepatitis.  On admission, LFTs showed AST equaled 69 and ALT      equaled 155, recent hepatitis panel done on February 23, 2008,      showed hepatitis C antibody reactive, no previous documented      diagnosis made.  Based on previous records in 2008, LFTs were      within normal limits.  Trending down in AST and ALT noted since      February 22, 2008.  Patient will need followup as an outpatient for      further management.  4. Trichomonas infection.  Patient's symptoms of dysuria, hematuria      and hesitancy were worrisome for a UTI, on admission  urinalysis      showed pyuria and on hospital day #2 Trichomonas noted, one dose of      Flagyl 2 grams by mouth given and infection resolved by      hospitalization day #3.  5. Hallucinations and suicidal ideations.  Patient was admitted this      time for hallucinations and suicidal ideations which were present 1      day prior to admission and early in the morning of the day of      admission.  Sitter was not provided because no active suicidal      ideation.  However, patient has history of suicide attempt in 2007      and was diagnosed with schizophrenia and bipolar disorder.  He is      going to be transferred to [facility], is actively      hallucinating and needs psychotherapy.  Home meds:  Trazodone,      Seroquel and Paxil were given during the hospitalization.  The      patient continued to have occasional hallucinations with no active      SIs.  6. Polysubstance abuse.  Urine drug screen positive for cocaine and      patient still actively using cocaine and THC as well alcohol.      These behavioral issues will be addressed when patient transfers to      [facility].  Alcohol cessation counseling was      provided.  Folic acid was continued.  7. Tobacco abuse.  Smoking cessation counseling was also provided and      patient expressed willingness to stop.  We provided nicotine patch      and patient reported tolerating it well.  8. Hypertension.  We continued the patient on Cardizem 240  mg daily,      stopped hydrochlorothiazide and patient maintained blood pressure      within the target range.  On discharge we changed Cardizem to      Cardizem CD 120 mg daily since the patient noted to be bradycardic      (likely secondary to questionable compliance with medication at      home then starting them in the hospital).  Blood pressure will be      followed with physician at [facility] on followup appointment.   DISCHARGE LABS AND VITALS:  Vitals:   Blood pressure 128/80, temperature  98, pulse 53, respirations 18, saturating 97% on room air.  CBGs 124.  Labs:  Sodium 139, potassium 3.8, chloride 102, bicarb 23, BUN 14,  creatinine 1.28, glucose 191.  White blood cells 6.9, hemoglobin 12.5,  platelets 219.      Mliss Sax, MD  Electronically Signed      Alvester Morin, M.D.  Electronically Signed    IM/MEDQ  D:  03/12/2008  T:  03/12/2008  Job:  [id number]

## 2010-10-28 NOTE — Discharge Summary (Signed)
[name], [name]              ACCOUNT NO.:  [id number]   MEDICAL RECORD NO.:  [id number]          PATIENT TYPE:  INP   LOCATION:  3742                         FACILITY:  [facility]   PHYSICIAN:  Fransisco Hertz, M.D.  DATE OF BIRTH:  [birth year]   DATE OF ADMISSION:  02/19/2008  DATE OF DISCHARGE:  02/23/2008                               DISCHARGE SUMMARY   DISCHARGE DIAGNOSES:  1. Chest pain in the setting of cocaine abuse.  2. Schizophrenia, depressive disorder.  3. Hepatitis C.  4. Coronary artery disease, acute myocardial infarction in 2004, stent      to obtuse marginal.  5. Hypertension.  6. Cocaine abuse.  7. Tobacco abuse.  8. Systemic lupus erythematosus.   DISCHARGE MEDICATIONS:  1. Depakote 150 mg 1 tablet p.o. daily.  2. Paxil 20 mg 1 tablet p.o. daily.  3. Seroquel 100 mg 1 tablet p.o. at bedtime.  4. Trazodone 100 mg 1 tablet p.o. at bedtime.  5. Aspirin 81 mg 1 tablet p.o. daily.  6. Tiazac 240 mg 1 tablet p.o. daily.  7. Hydrochlorothiazide 25 mg 1 tablet p.o. daily.  8. Plavix 75 mg 1 tablet p.o. daily.  9. Protonix 20 mg 1 tablet p.o. daily.   CONDITION AT DISCHARGE:  The patient will be discharged from the  [facility] because of suicidal and homicidal  ideations.  The patient did not have any active chest pain at discharge.  The patient will need to have CBCs, liver function tests, platelet, and  coagulation studies.   PROCEDURES:  2-D echo: summary of left ventricular systolic function was  mildly decreased.  Left ventricular ejection was estimated to be 55%.  There appeared to be mild hypokinesis of the mid inferoposterior wall.  There was increased relative contribution of atrial contraction and left  ventricular filling.   CONSULTATIONS:  Antonietta Breach, MD, Psychiatry.   HISTORY OF PRESENT ILLNESS:  A 45 year old man with past medical history  of CAD, SPMI in 2004 with stent placement, hypertension, bipolar  disorder/schizophrenia with history of admission for suicidal ideation  greater than 2 times in the past, presenting with chest pain.  Chest  pain started the night before admission after hanging out with friends.  The patient states he drank a bunch that had cocaine in it.  He did not  know that his friends had put cocaine in his bunch.  Chest pain is 10/10  intensity, he described it as being pressure-like in the thorax,  pleuritic, which increased with changes of position, deep breathing, and  food indigestion.  Localized to left chest and radiated to right lower  abdomen, kidneys, and lower back.  Associated with shortness of breath  and palpitations.  No nausea, vomiting, or diarrhea.  Has numbness in  bilateral left lower extremity, which is chronic.  Chest pain was  decreased 8/10 in intensity after taking Ativan in the emergency room.  The patient feels that the pain is the same intensity as when he had his  myocardial infarction in 2004.  He has not had this type of pain since  his  myocardial infarction.   PHYSICAL EXAMINATION:  VITAL SIGNS:  Temperature 98.1, blood pressure  150/86, pulse 103, respiratory rate 20, and oxygen saturation 95 on room  air.  EYES:  PERRLA, EOMI.  NECK:  Supple.  LUNGS:  Clear to auscultation bilaterally.  CV:  No murmurs, rubs, or gallops.  Tachy.  GI:  Soft.  Tender to palpation, left lower quadrant more than right  lower quadrant.  No guarding.  Hyperactive bowel sounds.  EXTREMITIES:  No edema.  GU:  No CVA tenderness.  NEURO:  No focal deficits.   LABORATORY DATA:  Sodium 138, potassium 3.7, chloride 107, bicarbonate  20, BUN 23, creatinine 1.39, and glucose 130.  White blood cells 17.7,  hemoglobin 14.4, hematocrit 41.8, and platelets 227.  PT 14, INR 1.1,  PTT 37, D-dimer 0.27, calcium 9.7, magnesium 2.4, hemoglobin A1c 7.2,  and lipase 57.  UDS positive for cocaine.  Alcohol level is less than 5.  Beta-natriuretic peptide 90.  TSH  0.683.   HOSPITAL COURSE:  1. Chest pain.  The patient's chest pain most likely due to vasospasm      induced by cocaine.  Cardiac enzymes were cycled and were all      negative.  EKGs were done, which did not show any impressive signs      of acute myocardial ischemia.  EKG did show some signs of old      inferolateral ischemia, which had not changed from previous EKG      taken on October 05, 2006.  Ruled out aorta dissection by chest x-ray      which did not show evidence of mediastinal widening.  There was no      evidence of infections on chest x-ray, although the patient did      have an elevated white blood cells, which was probably due to      stress margination.  The patient did not have any signs of      pulmonary embolism that might explain chest pain.  No tachypnea.      Oxygen saturation was normal.  D-dimer was normal.  The patient did      not have any signs of GERD that would indicate chest pain; no      nausea or vomiting, no complaints of acid reflux but for      prophylaxis while in the hospital he was given Protonix.  Chest      pain resolved by second day of hospitalization.  2. Schizophrenia and mood disorder.  The patient came to the hospital      with 3 SSRIs and Seroquel, which the combination of these      medications can increase the risk of Serotonin syndrome.  All SSRIs      were held until Dr. Jeanie Sewer evaluated the patient and recommended      further medical management.  While in the hospital, the patient      expressed having suicidal and homicidal ideations so bedside sitter      was placed to monitor patient at all times.  Psychiatry, Dr.      Jeanie Sewer evaluated the patient and recommended Depakote 500 mg at      night, Paxil 20 mg daily, Seroquel 100 mg at night, trazodone 100      mg at night, and recommended to recheck CBCs and liver function      tests on next day of hospitalization and 3 days after to monitor  any of adverse effects of  medication.  Two days later, Dr.      Jeanie Sewer revaluated the patient and advised holding Depakote      because of elevated liver enzymes, to increase Seroquel and admit      the patient to [facility] for treatment of his suicidal and      homicidal ideations.  The patient was discharged on February 23, 2008 to [facility] for further psychiatric treatment.  3. Hepatitis C.  In view of elevated liver function tests, hepatitis      panel was ordered.  Hepatitis C antibody was reactive.  Hepatitis      C, RNA was ordered, but for some one reason or another, it was not      done.  Most likely, this is a new problem.  The patient did not      have any knowledge of hepatitis C infection.  The patient was      educated on new diagnosis of hepatitis C.  Patient will need      further workup of hepatitis C on an outpatient basis.  4. Hypertension.  The patient was continued on home medication, which      he did well on during the hospital stay.  No changes to home      medication needed to be made.  Hypertension was stable throughout      hospitalization.  5. Cocaine abuse.  Clinical Social Worker was consulted for counseling      to provide information for support groups, although the patient was      receptive to information.  He was not interested in any support      groups.   DISCHARGE LABS:  White blood cell 7.7, hemoglobin 13.6, hematocrit 39.8,  and platelets 218.  Sodium 140, potassium 4.2, chloride 109, bicarbonate  26, BUN 14, creatinine 1.34, glucose 82, alkaline phosphatase 80,  bilirubin 0.07, AST 141, ALT 282, total protein 6.7, albumin 3.5, and  calcium 9.4.   VITAL SIGNS:  Temperature 97.8, pulse 56, respirations 16, blood  pressure 147/98, and oxygen saturation 98% on room air.      Danne Harbor, MD  Electronically Signed      Fransisco Hertz, M.D.  Electronically Signed    RV/MEDQ  D:  03/28/2008  T:  03/29/2008  Job:  [id number]   cc:    Antonietta Breach, M.D.

## 2010-10-28 NOTE — Discharge Summary (Signed)
NAMEELLWYN, ERGLE NO.:  [id number]   MEDICAL RECORD NO.:  [id number]          PATIENT TYPE:  IPS   LOCATION:  0305                          [facility]ACILITY[facility]  [facility]   PHYSICIAN:  Jasmine Pang, M.D. DATE OF BIRTH:  [birth year]   DATE OF ADMISSION:  07/26/2008  DATE OF DISCHARGE:  08/03/2008                               DISCHARGE SUMMARY   IDENTIFICATION:  This is a 45 year old male who was admitted on a  voluntary basis on July 26, 2008.   HISTORY OF PRESENT ILLNESS:  The patient is here with an intentional  overdose.  He overdosed on variety of his pills, feeling depressed and  suicidal.  He denies any specific stressors, although stating that he  does not have a life and is also using cocaine.   PAST PSYCHIATRIC HISTORY:  The patient was here at the end of January  for an overdose at that time and was positive for cocaine.  His follow  up was to be at [facility].   FAMILY HISTORY:  Unknown.   ALCOHOL AND DRUG HISTORY:  No current alcohol use, but again has been  using cocaine.   MEDICAL PROBLEMS:  History of hypertension, history of atypical chest  pain, coronary artery disease.  The patient is also status post  percutaneous coronary intervention with stent.   MEDICATIONS:  Recently prescribed:  1. Plavix 75 mg daily.  2. Folic acid 1 mg daily.  3. Lisinopril 20 mg daily.  4. Diltiazem ER 120 mg daily.  5. Celexa 40 mg daily.  6. Seroquel 400 mg at bedtime.  Compliance with medications is      unclear.   DRUG ALLERGIES:  1. PENICILLIN.  2. AMOXICILLIN.  3. CAPTOPRIL.   PHYSICAL FINDINGS:  The patient was fully assessed at the [facility]  Emergency Department.  Physical exam was reviewed with no significant  findings.  The patient did received charcoal for the overdose.   LABORATORY DATA:  White blood cell count was 13.6.  Alcohol level was  less than 5.  Acetaminophen level less than 10.  Urine drug screen is  positive for cocaine.   Glucose was 131.   HOSPITAL COURSE:  Upon admission, the patient was started on Plavix 75  mg daily, Seroquel XR 400 mg at bedtime, folic acid 1 mg daily,  lisinopril 20 mg daily, Celexa 40 mg daily, diltiazem 120 mg daily, and  Librium 25 mg p.o. q.6 h. p.r.n. anxiety and agitation for 48 hours  only.  He was also started on 21-mg nicotine patch as per smoking  cessation protocol.  In individual sessions, the patient was friendly  and cooperative.  He stated he was depressed and anxious.  He states he  has a history of bipolar disorder.  He states I do not have a life.  He has no income and cannot help his daughter and her 2 kids.  He had  been on our unit just 3 weeks ago.  As hospitalization progressed, the  patient discussed the fact that aneurysms will run in the family.  He  wants his brain  checked out.  We ordered a CT scan of the brain.  This  was normal.  On July 30, 2008, the patient discussed working on work  rehab when he leaves here.  He was feeling optimistic about this.  His  sleep was good with Seroquel.  He was having some GERD symptoms, which  was interfering with his appetite.  Mood was still depressed and there  was positive suicidal ideation.  On July 31, 2008, the patient was  less depressed and less anxious.  There was no suicidal ideation.  However, on August 01, 2008, he was depressed and anxious again with a  constricted affect and positive suicidal ideation (overdose).  He was  also having auditory hallucinations to kill myself.  He was having no  side effects except for some sedation in the a.m.  By August 02, 2008,  his mood was improving.  There was no suicidal ideation.  He discussed  trying to get disability.  He has been frustrated with this process, but  is attempting to get this.  On August 03, 2008, sleep was good.  Appetite was good.  Mood was less depressed, less anxious.  Affect was  consistent with mood.  There was no suicidal or  homicidal ideation.  No  thoughts of self-injurious behavior.  No auditory or visual  hallucinations.  No paranoia or delusions.  Thoughts were logical and  goal-directed.  Thought content.  No predominant theme.  Cognitive was  grossly intact.  Insight good.  Judgment good.  Impulse control good.  The patient was felt to be safe for discharge.  He wanted to go home.  His daughter is going to stay with him for a while he says.   DISCHARGE DIAGNOSES:  Axis I:  Cocaine dependence, mood disorder, not  otherwise specified.  Axis II:  None.  Axis III:  Hypertension, coronary artery disease.  Axis IV:  Severe (psychosocial problems, medical problems, chronic  substance abuse).  Axis V:  Global assessment of functioning was 50 at discharge.  GAF was  35 upon admission.  GAF was 60 highest past year.   DISCHARGE PLANS:  There was no specific activity level or dietary  restrictions.   POSTHOSPITAL CARE PLANS:  The patient will see Dr. Joni Reining at the  [facility] on September 04, 2008.  He will also talk with Greig Castilla at  [facility] for help with this.  He is to see his doctor for his  medical problems and prescriptions including Plavix and lisinopril etc.   DISCHARGE MEDICATIONS:  1. Plavix 15 mg daily.  2. Seroquel XR 400 mg at bedtime.  3. Folic acid 1 mg daily.  4. Lisinopril 20 mg daily.  5. Celexa 40 mg daily.  6. Diltiazem 120 mg daily.      Jasmine Pang, M.D.  Electronically Signed     BHS/MEDQ  D:  08/24/2008  T:  08/25/2008  Job:  [id number]

## 2010-10-28 NOTE — Consult Note (Signed)
[name], [name]              ACCOUNT NO.:  [id number]   MEDICAL RECORD NO.:  [id number]          PATIENT TYPE:  INP   LOCATION:  3742                         FACILITY:  [facility]   PHYSICIAN:  Antonietta Breach, M.D.  DATE OF BIRTH:  1965/11/17   DATE OF CONSULTATION:  02/22/2008  DATE OF DISCHARGE:  02/23/2008                                 CONSULTATION   CONSULTATION FOLLOWUP   [name] has been having suicidal thoughts as well as thoughts of  harming others.  The social worker documented this as well.  He also has  been having auditory hallucinations.   He is not combative.  He is cooperative with care.  He is motivated for  further psychiatric care.   His memory and orientation function are intact.   REVIEW OF SYSTEMS:  NEUROLOGIC:  No stiffness or other extrapyramidal  side effects with the Seroquel.   LABORATORY DATA:  WBC 7.7, hemoglobin 13.6, platelet count 218.  He did  have a positive hepatitis C antibody.   PHYSICAL EXAMINATION:  VITAL SIGNS:  Temperature 97.7, pulse 72,  respiratory rate 20, blood pressure 130/88, O2 saturation on room air  95%.   MENTAL STATUS EXAM:  [name] has downcast facies.  His eye contact is  intermittent.  His attention span is normal.  He is oriented to all  spheres.  Concentration mildly decreased.  Affect constricted.  Mood  anxious.  Memory intact to immediate, recent and remote.  Speech within  normal limits.  Thought process logical, coherent, goal-directed.  No  looseness of associations.  Thought content:  [name] does have  suicidal thoughts.  He is concerned that he might harm someone else.  He  has auditory hallucinations which are coming and going.  He does not  have any suicidal plan and is motivated for care.  He agrees to call the  nursing station if he cannot resist the self-harm impulses.   His insight is intact.  His judgment is intact for the need of  treatment.   ASSESSMENT:  AXIS I:  293.82, psychotic  disorder not otherwise  specified.  296.80, bipolar disorder not otherwise specified, depressed.   [name] is not at risk to harm himself inside the supportive  environment of a hospital; however, he would be at risk to harm himself  and potentially others outside of the hospital.   The undersigned provided ego supportive psychotherapy and education.   RECOMMENDATION:  1. Would hold the Depakote until the liver function tests and      hepatitis C problem is further evaluated.  He may have to abort the      Depakote trial.  2. Would also recheck his liver function panel.  3. Would increase the Seroquel to 150 mg q.h.s. for further anti-      psychosis as well as acute mood stabilization,      including augmenting Paxil.  4. No change in the Paxil for anti-depression.  5. Would admit to a psychiatric ward as soon as possible.      Antonietta Breach, M.D.  Electronically Signed  JW/MEDQ  D:  02/24/2008  T:  02/24/2008  Job:  [id number]

## 2010-10-28 NOTE — H&P (Signed)
[name], House              ACCOUNT NO.:  [id number]   MEDICAL RECORD NO.:  [id number]          PATIENT TYPE:  IPS   LOCATION:  0402                          [facility]ILITY[facility]  [facility]   PHYSICIAN:  [name] Jungling, MD  DATE OF BIRTH:  08/03/1965   DATE OF ADMISSION:  01/08/2009  DATE OF DISCHARGE:                       PSYCHIATRIC ADMISSION ASSESSMENT   TIME:  8:37 a.m.   IDENTIFYING INFORMATION:  A 45 year old male, single.  This is an  involuntary admission.   HISTORY OF PRESENT ILLNESS:  This is the seventh [facility] admission this year  for this 45 year old who is well known to [place].  He has a history of  depressive disorder.  On the day of admission, he was standing on a  bridge thinking he wanted to jump to kill myself.  He was stopped by  some bystanders who brought him to the emergency room.  Says that he has  been hearing voices telling him to go ahead and kill himself.  He said  that he was anxious about his home situation after he lost his [address]  housing several weeks ago.  Also, citing stress from pain from some  groin lesions that he is receiving medical treatment for.  He endorses  relapsing on cocaine and having suicidal thoughts for about 1 week.   PAST PSYCHIATRIC HISTORY:  Multiple [facility] admissions.  He is followed as  an outpatient by the [facility] by Dr. Kathryne House and [name], his case manager.  Last [facility] admission Nov 03, 2008  to November 13, 2008.  Previously diagnosed with schizoaffective disorder and  polysubstance abuse.   SOCIAL HISTORY:  Single African American male.  Endorses the issues of  social isolation living alone in his own apartment.  No current legal  charges, but recent stressors are some medical issues and some recent  issues with housing.   FAMILY HISTORY:  Denies a family history of mental illness or substance  abuse.   PRIMARY CARE PHYSICIAN:  [name], M.D., who he most recently saw  last week.   MEDICAL  PROBLEMS:  1. Groin lesions NOS, rule out herpes 2 acute outbreak, rule out      infection.  2. GERD.  3. Hypertension.   CURRENT MEDICATIONS:  1. Cardizem CD 120 mg daily.  2. Protonix 40 mg daily.  3. Aspirin 81 mg daily.  4. Acyclovir 800 mg 5 times a day prescribed in the emergency room.  5. Celexa 40 mg daily.  6. Plavix 75 mg daily.  7. Seroquel 400 mg p.o. q.h.s.  8. Please note that the patient had been prescribed Imiquimod cream      for the lesions on his testicles which appeared to be herpetic in      the emergency room and the cream was discontinued.  The patient was      started on acyclovir.   DRUG ALLERGIES:  1. ACE INHIBITORS which cause angioedema and uvulitis.  2. PENICILLIN.   PHYSICAL EXAMINATION:  Physical exam was done in the emergency room as  noted in the record.  This is a generally  healthy-appearing male with a  rash over his groin.  He was evaluated in the emergency room with  changes in medication.   LABORATORY DATA:  Urine drug screen positive for cocaine.   MENTAL STATUS EXAM:  Reveals a fully alert male, pleasant, cooperative  in full contact with reality.  Insight good.  Speech is normal.  Gives a  coherent history.  Mood is anxious, depressed, primarily concerned  having some pain with his lesions, is uncomfortable.  Thought process  logical, coherent.  Endorsing suicidal thoughts.  Feels hopeless.  Endorses cocaine use.  Asking for help with his suicidal thoughts.  Cognition is intact.  Memory is intact.   AXIS I:  Mood disorder not otherwise specified.  Cocaine abuse.  AXIS II:  Deferred.  AXIS III:  Acute herpes outbreak.  Hypertension.  Gastroesophageal  reflux disease.  AXIS IV:  Severe issues with the support group and social environment.  AXIS V:  Current 46, past year 62 estimated.   PLAN:  The plan is to voluntarily admit him with a goal of alleviating  his discomfort and his suicidal thoughts.  We have given him comfort   measures.  We will continue the acyclovir started in the emergency room.  We will continue his other current medications at this point.  He is  enrolled on our intensive care unit for stabilization.  We will plan on  following up with the ACT Team.      [name], N.P.      [name] Jungling, MD  Electronically Signed    MAS/MEDQ  D:  01/10/2009  T:  01/10/2009  Job:  [id number]

## 2010-10-28 NOTE — Discharge Summary (Signed)
[name], [name]              ACCOUNT NO.:  [id number]   MEDICAL RECORD NO.:  [id number]          PATIENT TYPE:  IPS   LOCATION:  0401                          [facility]ACILITY:  [facility]   PHYSICIAN:  Anselm Jungling, MD  DATE OF BIRTH:  [birth year]   DATE OF ADMISSION:  02/23/2008  DATE OF DISCHARGE:  02/28/2008                               DISCHARGE SUMMARY   IDENTIFYING DATA/REASON FOR ADMISSION:  The patient is a 45 year old  single African American male who had been admitted to our facility 2  years prior.  He returned this time via [facility] where he was  hospitalized for 5 days to rule out a myocardial infarction.  He was  recommended for transfer to the inpatient psychiatry service because of  psychotic symptoms, and recent history of cocaine abuse.  Please refer  to the admission note for further details pertaining to the symptoms,  circumstances and history that led to his hospitalization.  He was given  an initial Axis I diagnosis of psychosis NOS, cocaine abuse, and  depressive disorder NOS.  Medical laboratory as above.  The patient had  a history of myocardial infarction with stent placement several years  ago.  He was medically and physically followed by the psychiatric nurse  practitioner once transferred to our facility.  He was continued on a  regimen of folic acid, Cardizem, lisinopril, and hydrochlorothiazide.  There were no acute medical issues.   HOSPITAL COURSE:  The patient was admitted to the adult inpatient  psychiatric service.  He presented as a well-nourished, normally-  developed adult male who was alert, and fully oriented.  His mood was  depressed, with flat and grim affect.  He denied any active suicidal  ideation.  He admitted to mild auditory hallucinations.  His thoughts  were well-organized and he displayed no signs or symptoms of thought  disorder, and there were no delusional statements.  He verbalized a  desire for help.  He was involved  in the therapeutic milieu and treated  with a regimen of psychotropic medications including Seroquel and  trazodone, with Paxil.  He had these medications previously and had been  successful with them.   He was difficult to involve in the therapeutic milieu, as he preferred  to stay in bed and slept most of the first 2 days of his hospital stay.  However he was pleasant and calm on approach.   Towards the end of his hospital stay, he worked with the case manager  towards an aftercare plan.  He indicated that he felt ready for  discharge on the 5th hospital day.  At that time he had remained absent  suicidal ideation, reported no more auditory hallucinations, and was  tolerating medications.   AFTERCARE:  As above.  The patient was to follow up with the  psychiatrist at [facility] with an appointment on  March 01, 2008.   DISCHARGE MEDICATIONS:  1. Seroquel 100 mg p.o. nightly.  2. Trazodone 50 mg nightly.  3. Paxil 20 mg daily.  4. Folic acid 1 mg daily.  5. Cardizem  240 mg daily.  6. Lisinopril 20 mg daily.  7. Hydrochlorothiazide 25 mg daily.   DISCHARGE DIAGNOSES:  Axis I:  Psychosis not otherwise specified and  history of polysubstance abuse in early remission.  Axis II:  Deferred.  Axis III:  History of myocardial infarction, hypertension.  Axis IV:  Stressors severe.  Axis V:  GAF on discharge 55.      Anselm Jungling, MD  Electronically Signed     SPB/MEDQ  D:  02/28/2008  T:  03/01/2008  Job:  [id number]

## 2010-10-28 NOTE — H&P (Signed)
[name], [name] NO.:  [id number]   MEDICAL RECORD NO.:  [id number]          PATIENT TYPE:  INP   LOCATION:  3312                         FACILITY:  [facility]   PHYSICIAN:  Della Goo, M.D. DATE OF BIRTH:  01/02/1966   DATE OF ADMISSION:  09/02/2008  DATE OF DISCHARGE:                              HISTORY & PHYSICAL   CHIEF COMPLAINTS:  Drank rat poison, cocaine and beer.   HISTORY OF PRESENT ILLNESS:  This is a 45 year old male who presents to  the [facility] emergency department after reporting that he had taken  rat poison 4 teaspoons liquid, cocaine and 1 can of beer in an attempt  to commit suicide.  The patient states he did this at about midnight and  he reports walking to the emergency department.  He states he was trying  to kill himself.  He states that he has been severely depressed.  He  also states that he has had multiple previous suicide attempts before  one attempt in which he cut his left wrist and several attempts where he  has taken overdoses of his medications.   PAST MEDICAL HISTORY:  Significant for:  1. Depression.  2. Coronary artery disease.  3. History of PTCA with 2 stents,  4. Congestive heart failure syndrome.  5. Hypertension.  6. Schizophrenia and previous suicide attempts.   MEDICATIONS:  His medications will need to be further verified but they  are listed as being Plavix, folic acid, lisinopril, diltiazem, Lexapro,  trazodone, Seroquel, Celexa and Paxil.   ALLERGIES:  No known drug allergies.   SOCIAL HISTORY:  The patient is divorced.  He is a smoker.  He reports  smoking one half a pack of cigarettes daily.  He reports drinking 1 can  of beer weekly.  He denies cocaine usage and reports that he had just  done cocaine this evening prior to arrival.  However, his medical  records indicate that he has a history of cocaine abuse.   REVIEW OF SYSTEMS:  Unable to obtain from the patient.   PHYSICAL EXAMINATION:   This a 45 year old thin, well-nourished, well-  developed male in no discomfort or acute distress.  VITAL SIGNS:  Temperature 94, blood pressure initially 168/114, heart  rate 99, respirations 22.  O2 saturations 97%.  Recheck of his vital  signs later revealed a blood pressure 107/77, heart rate 72,  respirations 18.  HEENT: Normocephalic, atraumatic.  Pupils equally round reactive to  light.  Extraocular movements are intact funduscopic benign.  There is  no scleral icterus.  Nares are patent bilaterally.  Oropharynx is clear.  NECK:  Supple, full range of motion.  No thyromegaly, adenopathy or  jugular venous distention.  CARDIOVASCULAR:  Regular rate and rhythm.  No murmurs, gallops or rubs.  LUNGS:  Clear to auscultation bilaterally.  ABDOMEN: Positive bowel sounds, soft, nontender, nondistended.  EXTREMITIES: Without cyanosis, clubbing or edema.  NEUROLOGIC EXAMINATION:  The patient is alert and oriented x3.  Cranial  nerves are intact.  Motor and sensory function are also intact.   LABORATORY STUDIES:  White blood cell count 10.2, hemoglobin  14.1,  hematocrit 39.3, MCV 89.9, platelets 209, neutrophils 66%, lymphocytes  25%.  Sodium 139, potassium 3.6, chloride 107, bicarb 22, BUN 14,  creatinine 1.3 and glucose 103.  Point of care cardiac markers with a  myoglobin of 69.6, CK-MB less than 1.0, troponin less than 0.05.  Alcohol level less than 5.  Urine drug screen positive for cocaine.  Salicylate level less than 4.0.  Acetaminophen level less than 10.0.  Pro time 13.0 and INR 1.0.   ASSESSMENT:  A 45 year old male being admitted with:  1. Toxic ingestion/overdose.  2. Suicidal attempt versus suicidal gesture.  3. Chest pain.  4. Elevated blood pressure transiently  5. Severe depression.   PLAN:  The patient will be admitted to a step-down ICU area.  The  patient will be placed on suicide precautions and a one-to-one sitter  has been requested.  Cardiac enzymes will be  performed.  The patient has  been administered vitamin K 5 mg IV x1 dose and the PT and INR levels  will be monitored.  Further vitamin K will be given if needed.  P.r.n.  clonidine will be used if the patient has elevated blood pressures or  signs of cocaine withdrawal.  IV Protonix has been ordered for GI  prophylaxis and SCDs have been ordered for DVT prophylaxis.  Psychiatry  will be consulted.      Della Goo, M.D.  Electronically Signed     HJ/MEDQ  D:  09/02/2008  T:  09/02/2008  Job:  [id number]

## 2010-10-28 NOTE — H&P (Signed)
[name], [name]              ACCOUNT NO.:  [id number]   MEDICAL RECORD NO.:  [id number]          PATIENT TYPE:  IPS   LOCATION:  0504                          FACILITY:  [facility]   PHYSICIAN:  Geoffery Lyons, M.D.      DATE OF BIRTH:  31-Aug-1965   DATE OF ADMISSION:  03/13/2008  DATE OF DISCHARGE:                       PSYCHIATRIC ADMISSION ASSESSMENT   TIME:  10 a.m.   IDENTIFYING INFORMATION:  A 45 year old [place] American male.  This is  a voluntary admission.   HISTORY OF PRESENT ILLNESS:  Third [facility] admission for this 45 year old  who was transferred from the medical unit after a 5 day hospital stay in  our telemetry unit for chest pain concurrent with cocaine abuse.  He  relapsed on cocaine quite some time ago and has been using it regularly,  unable to maintain any abstinence.  Had also complained of hearing  things and seeing things along with voices telling him that he should  just go ahead and kill himself.  He has a past history of attempting to  hang himself once and also walking into traffic due to the command  hallucinations.  He was transferred here for evaluation.  Today, Trey Paula  reports that he indeed has been using cocaine fairly regularly.  Had not  been taking his medications at home on a regular basis.  Had missed 1-2  appointments at mental health and has had frequent medication changes.  Urine drug screen is positive for cocaine.  He has denied other  substance abuse.  Endorses today that he has had some auditory  hallucinations with voices telling him that he should harm himself, but  he feels that the voices are manageable when he can isolate himself,  focus himself and is not agitated.  Denies intent to harm himself.   PAST PSYCHIATRIC HISTORY:  Currently followed as an outpatient at the  [facility].  Also receives help from [facility]  Endorses multiple  medication changes and is not quite sure what his previous medications  were.  In the past, he has  had trials of Seroquel, Paxil, Prozac,  trazodone and Celexa.  This is his third [facility] admission with his previous  admissions February 23, 2008 to February 28, 2008.  Does have a  history of suicide attempts.  Also prior hospitalizations at [facility] in 2006 and [facility].   SOCIAL HISTORY:  A single African American male, unemployed, has filed  for disability.  He has an eleventh grade education.  Three children who  do not live with him.  The housing coalition is currently paying his  rent on a regular apartment so he has a safe place to go.  No current  legal charges.   FAMILY HISTORY:  Noncontributory.   MEDICAL HISTORY:  Followed at [facility]  Medical  problems include hypertension.  He has a history of coronary artery  disease and is status post stent placement.  Most recently diagnosed  with a positive hepatitis C test.  Diagnosed with pyuria and treated  with Cipro in the medical unit.  Trichomonas treatment  completed.  He  has a history of tobacco abuse and myocardial infarct in 2003.   CURRENT MEDICATIONS:  1. Paxil 20 mg daily.  2. Seroquel 100 mg p.o. q.h.s.  3. Cardizem CD 120 mg daily.  4. Trazodone 50 mg p.o. q.h.s.  5. Aspirin 325 mg daily.  6. Folic acid 1 mg daily.  7. His recent doses of lisinopril and HCTZ have been stopped on the      medical unit as was his Plavix.   PHYSICAL EXAMINATION:  Physical exam was done on the medical unit.  Fully alert gentleman, healthy-appearing in no distress today.  No  symptoms of chest pain, 6 feet tall, 223 pounds, temperature 97.4, pulse  45, respirations 20, blood pressure 139/84.   LABORATORY DATA:  Diagnostic studies were remarkable for urine drug  screen positive for cocaine.  Chemistry:  Sodium 140, potassium 3.4, chloride 103, carbon dioxide 20,  BUN 16, creatinine 1.42.  Liver enzymes:  SGOT 69, SGPT 155, alkaline  phosphatase 89 and total bilirubin 1.0.   MENTAL STATUS  EXAM:  Fully alert, cooperative, polite, soft-spoken.  Eye  contact is variable.  Speech is normal.  Gives a coherent history.  Rather vague about his medication compliance.  Insight adequate.  Mood  neutral today.  Thought process logical, coherent, goal directed.  Interested in getting back on a regular schedule with mental health.  Asking for help with his hallucinations.  Does endorse auditory  hallucinations today, not particularly demanding or intrusive.  Denies  suicidal intent.  Cognition is completely intact.  Immediate, recent and  remote memory are intact.  No signs of delirium or confusion.   AXIS I:  Psychosis not otherwise specified.  Cocaine abuse.  AXIS II:  Deferred.  AXIS III:  Hepatitis C positive.  History of chest pain with cocaine  use.  Lupus.  AXIS IV:  Deferred.  AXIS V:  Current 46, past year not known.   PLAN:  Voluntarily admit him to stabilize him.  Alleviate any suicidal  thoughts.  He is enrolled in our dual diagnosis program.  He signed a  release for [place] to coordinate with mental health and to view his  medication records.      [name] A. Scott, N.P.      Geoffery Lyons, M.D.  Electronically Signed    MAS/MEDQ  D:  03/14/2008  T:  03/14/2008  Job:  [id number]

## 2010-10-28 NOTE — Discharge Summary (Signed)
[name], MUCCIO NO.:  [id number]   MEDICAL RECORD NO.:  [id number]          PATIENT TYPE:  IPS   LOCATION:  0504                          FACILITY:  [facility]   PHYSICIAN:  Geoffery Lyons, M.D.      DATE OF BIRTH:  12/01/1965   DATE OF ADMISSION:  03/13/2008  DATE OF DISCHARGE:  03/19/2008                               DISCHARGE SUMMARY   CHIEF COMPLAINT AND PRESENT ILLNESS:  This was the third admission to  [facility] for this 45 year old African American male  transferred from the medical unit after a 5-day hospital stay for chest  pain concurrent with cocaine abuse.  Relapsed on cocaine some time ago,  using it regularly and also complained of hearing things and seeing  things along with voices telling him he should just go ahead and kill  himself.  Past history of attempting to hang himself once and also  walking into traffic due to command hallucinations.  He did endorse use  of cocaine fairly regularly.  Has not been taking the medication at home  on a regular basis.  UDS positive for cocaine.   PAST PSYCHIATRIC HISTORY:  Followup by [facility].  Also received  help from [facility].  Multiple medication changes.  Had been on  Seroquel, Paxil, Prozac, trazodone and Celexa.  [facility] September 10 to February 28, 2008.  Also has been in  [facility].   ALCOHOL/DRUG HISTORY:  Persistent use of cocaine as already described.   MEDICAL HISTORY:  1. Hypertension.  2. Coronary artery disease status post stent placement.  3. Most recently diagnosed with hepatitis C.   MEDICATION:  1. Paxil 20 mg per day.  2. Seroquel 100 mg at night.  3. Cardizem CD 120 mg per day.  4. Trazodone 50 mg at bedtime.  5. Aspirin 325 mg per day.  6. Folic acid 1 mg per day.  7. Lisinopril hydrochlorothiazide was stopped.   PHYSICAL EXAM:  Failed to show any acute findings.   LABORATORY WORK:  UDS positive for  cocaine.  Blood chemistry:  Sodium  140, potassium 3.4, BUN 16, creatinine 1.42, SGOT 69, SGPT 155, total  bilirubin 1.0.   MENTAL STATUS EXAM:  Reveals a fully alert cooperative male, polite,  soft-spoken.  Speech was normal in rate, tempo and production, giving a  coherent history.  Medical noncompliance.  Mood is depressed.  Affect  constricted.  Thought processes are logical, coherent and relevant.  Endorsed no active suicidal or homicidal ideas.  No evidence of  hallucinations or delusions.  Cognition well-preserved in.   ADMITTING DIAGNOSES:  Axis I:  Psychotic disorder not otherwise  specified.  Cocaine abuse, rule out dependence.  Axis II: No diagnosis.  Axis III:  Chest pain with cocaine use, hepatitis C positive screen,  hypertension, coronary artery disease with status post stent placement.  MI in 2003.  Axis IV: Moderate.  Axis V:  Upon admission 35.  Highest GAF in the last year 60.   COURSE IN THE HOSPITAL:  Was admitted.  He was started individual and  group psychotherapy.  We gave him some trazodone.  We worked towards  stabilizing his mood and his voices. Endorsed the voices were telling  him to hurt himself and other people going on for 2 or 3 years, cannot  sleep.  October 1 he was having a very hard time.  Sleep was an issue.  Admits to voices.  Mostly in bed, isolating himself.  On Seroquel 200.  On October 2 things got better and said he had seen a decrease in the  voices.  Looking back.  He endorsed he was on Seroquel XR 300 mg and it  was very sedating.  It was decreased to 100 at bedtime.  We felt that at  200 was agreeing with him okay and willing to continue it.  In the next  48 hours he continued to improve, was tolerating the medication well.  He stopped having any active suicidal or homicidal ideas or any voices,  so by October 15 was in full contact with reality.  No active suicidal  or homicidal ideas.  No hallucinations.  He was willing and motivated  to  pursue outpatient treatment.  Therefore, we went ahead and discharged to  outpatient followup.   DISCHARGE DIAGNOSES:  Axis I:  Psychotic disorder not otherwise  specified.  Cocaine abuse.  Axis II: No diagnosis.  Axis III:  Chest pain with cocaine use, history of myocardial infarction  in 2003, screening positive for hepatitis C, pyuria treated with Cipro,  hypertension, coronary artery disease status post stent placement.  Axis IV:  Moderate.  Axis V:  Upon discharge 50-55.   DISCHARGED ON:  1. Paxil 20 mg per day.  2. Seroquel 200 mg at bedtime  3. Trazodone 100 mg at bedtime.  4. Folic acid 1 mg per day.  5. Aspirin 325 mg per day.  6. Diltiazem 120 mg daily.  7. Protonix 40 mg per day.   FOLLOWUP:  Dr. Lang Snow and his primary care Modine Oppenheimer.      Geoffery Lyons, M.D.  Electronically Signed     IL/MEDQ  D:  04/11/2008  T:  04/11/2008  Job:  [id number]
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
## 2010-10-31 NOTE — Assessment & Plan Note (Signed)
[facility]                            CARDIOLOGY OFFICE NOTE   NAME:House, [name]                       MRN:          [id number]  DATE:10/04/2006                            DOB:          [birth year]    [name] is a 45 year old gentleman with a history of coronary disease  (status post subendocardial myocardial infarction) at [facility]  in 2004, in the setting of cocaine use; had a stent to his OM at that  time (hypertension and depression), who returns for followup and for  evaluation of pre-syncope.  I recently saw him on October 02, 2006 in the  [facility] Emergency Room, secondary to atypical chest pain and  abdominal pain.  We admitted the patient for rule out myocardial  infarction.  Note his hemoglobin and hematocrit and D-dimer were all  normal.  Liver functions were normal.  His cardiac markers were  negative.  However, he did not have followup enzymes, as he signed out  against medical advice.  He was seen again there yesterday, although I  do not have those records available.  He apparently was complaining of  dizziness with standing, and he attributes it to high blood pressure.  This is much better now.  He did not have frank syncope.  There is no  further chest pain or shortness of breath.  Note, he is not taking his  medications this morning.   PRESENT MEDICATIONS:  His present medications include:  1. HCTZ 25 mg p.o. q. day.  2. Paxil 20 mg p.o. q. day.  3. Cardizem 240 mg p.o. q. day.  4. Celexa 40 mg p.o. q. day.  5. Seroquel 100 mg p.o. day.  6. Plavix 75 mg p.o. q. day.   PHYSICAL EXAMINATION:  VITAL SIGNS:  Shows a blood pressure of 128/100,  and his pulse is 69.  NECK:  Supple with no bruits.  CHEST:  Clear.  CARDIOVASCULAR:  Reveals a regular rate and rhythm.  EXTREMITIES:  Showed no edema.   His electrocardiogram shows a sinus rhythm at a rate of 69.  There is  left ventricular hypertrophy with repolarization  abnormalities.   DIAGNOSES:  1. Atypical chest pain - His recent enzymes were negative.  We will      schedule him for a stress Myoview for risk stratification.  If he      shows normal perfusion, we will continue with medical therapy.  2. Coronary artery disease with a history of percutaneous coronary      intervention of the OM - We will continue with risk factor      modification.  I have discussed the importance of discontinuing his      tobacco use.  Note that I have asked him to resume his aspirin and      Plavix, as he has not had this filled recently.  We will continue      on his Cardizem, but I will avoid beta blocker, given his history      of cocaine use.  We will add a Statin (Zocor 40 mg p.o. q.h.s.),  and we will check lipids and liver in 6 weeks and adjust as      indicated.  3. Hypotension - The patient has not taken his blood pressure      medications today, and we will continue with the same.  4. History of depression, bipolar and schizophrenia - Per his primary      care physicians at [facility].  5. Tobacco abuse - The patient will follow up in [facility] for      possible nicotine patch or Chantix, if available.  He states he      does not have the money for these medications.  6. We will see him back in 6 months, if his Myoview is normal.     Madolyn Frieze. Jens Som, MD, [facility]  Electronically Signed    BSC/MedQ  DD: 10/05/2006  DT: 10/05/2006  Job #: [id number]
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
## 2010-10-31 NOTE — H&P (Signed)
NAMEMARGARET, [name]              ACCOUNT NO.:  [id number]   MEDICAL RECORD NO.:  [id number]          PATIENT TYPE:  IPS   LOCATION:  0300                          FACILITY:  [facility]   PHYSICIAN:  [name] Jungling, MD  DATE OF BIRTH:  July 14, 1965   DATE OF ADMISSION:  04/08/2006  DATE OF DISCHARGE:                         PSYCHIATRIC ADMISSION ASSESSMENT   A 45 year old single African-American male voluntarily admitted on  04/08/2006.   HISTORY OF PRESENT ILLNESS:  The patient presents with a history depression,  was having thoughts and made an attempt to hang himself from a tree near  where he has been living in his truck as he is currently homeless.  The  patient states that he stopped when people came by.  He also reports he has  been using cocaine.  He has little social support and little family support.  He is noncompliant with this medications for his health problems.  He has  lost about 25 pounds and is endorsing derogatory hallucinations,   PAST PSYCHIATRIC HISTORY:  First admission to [facility].  Was  at [facility] in 2006 for depression, suicidal ideation, made attempt to  hang himself and jump in front of traffic in past attempts.  The patient  reports no current outpatient psychiatric treatment.   SOCIAL HISTORY:  He is a 45 year old single African-American male.  He has  three children.  Has an 11th grade education.  Has been homeless for  approximately a year.  He is currently living in his truck.  He has  attempted to get disability.  Denies any legal issues.  He denies alcohol or  drug history.  The patient smokes cigarettes.  Denies any alcohol use.  Reports recent cocaine use.  Primary care [place]:  Has none.   MEDICAL PROBLEMS:  1. Coronary artery disease.  He reports a history of an MI in 2003.  2. Hypertension.  3. Lupus.   MEDICATIONS:  Has been off his medications for at least one month.  Recalls  being on Plavix and in the past has been  on Zoloft but found himself getting  more depressed.   DRUG ALLERGIES:  No known allergies.   The patient was fully assessed at [facility] where he received  clonidine 0.2 mg for elevated blood pressure.  He is a well-nourished male,  tall, no acute distress.  Denied any complaints.   His temperature is 98, with 46 heart rate, respirations 18, blood pressure  171/105, 6 feet 2 inches tall, 159 pounds.   LABORATORY DATA:  Abnormal EKG.  CMET was within normal limits.  Alcohol  level was less than 0.01.  Acetaminophen level less than 10.  Urine drug  screen is positive for cocaine.  Hemoglobin 13.9, hematocrit 40.2.   MENTAL STATUS EXAM:  He is fully alert, cooperative.  Little eye contact.  He is casually dressed.  Speech is clear, normal rate and tone.  Mood is  depressed.  Patient is flat, also appears sad and depressed.  Thought  processes are currently endorsing auditory hallucinations and some visual  hallucinations, although does not appear to  be actively responding to  internal stimuli at this time.  Thought processes otherwise are goal  directed.  Cognitive function intact.  Memory is fair.  Judgment poor to  fair.  Insight is partial.   AXIS I:  Depressive disorder with psychotic features, rule out mood disorder  with psychotic features; cocaine abuse.  AXIS II:  Deferred.  AXIS III:  Hypertension, coronary artery disease, status post a myocardial  infarction in 2003.  AXIS IV:  Problems with housing, access to health services, lack of support,  medical problems, other psychosocial problems.  AXIS V:  Current is 35.   PLAN:  Stabilize mood and thinking.  Will clarify medications with the past  hospitalization case manager.  Will look at housing situation and will  initiate Lexapro.  Will administer flu vaccine.  The patient is be  medication compliant.  Will address substance abuse.  Patient is to follow  up with mental health for further outpatient mental health  services.      [name], N.P.      [name] Jungling, MD  Electronically Signed    JO/MEDQ  D:  04/09/2006  T:  04/10/2006  Job:  [id number]

## 2010-10-31 NOTE — Discharge Summary (Signed)
[name], House              ACCOUNT NO.:  [id number]   MEDICAL RECORD NO.:  [id number]          PATIENT TYPE:  IPS   LOCATION:  0402                          FACILITY:  [facility]   PHYSICIAN:  Anselm Jungling, MD  DATE OF BIRTH:  April 07, 1966   DATE OF ADMISSION:  01/08/2009  DATE OF DISCHARGE:  01/14/2009                               DISCHARGE SUMMARY   IDENTIFYING DATA AND REASON FOR ADMISSION:  This was one of several [facility]  admissions for [name], a 45 year old single African American male.  He  was admitted due to increasing symptoms of depression and suicidal  ideation.  Please refer to the admission note for further details  pertaining to the symptoms, circumstances and history that led to his  hospitalization.  He was given an initial Axis I diagnosis of  polysubstance abuse, and, due to a finding of recent cocaine usage,  cocaine abuse, NOS, mood disorder, NOS, and Axis II disorder, NOS.   MEDICAL AND LABORATORY:  The patient was medically and physically  assessed by the psychiatric nurse practitioner.  He has a history of HIV  as well as cardiac disease.  He was continued on his usual diltiazem,  aspirin, Prilosec for GERD, and for an apparent herpetic infection in  his groin area, acyclovir, doxycycline.  Aside from this, there were no  acute medical issues.   HOSPITAL COURSE:  The patient was admitted to the adult inpatient  psychiatric service.  He presented as a well-nourished, normally-  developed adult male who was alert, fully oriented, and depressed with  flat affect.  He admitted to suicidal ideation but no plan or intent and  verbalized a strong desire for help.  There were no signs or symptoms of  psychosis.  He reluctantly and belatedly admitted to his cocaine  relapse.  He was not able to acknowledge that there might be any  connection between his cocaine relapse and his mood state or his  financial destitution, which he indicated was his main stressor.   He  participated in various therapeutic groups and activities including  our 12-step recovery group.  We coordinated with his assertive community  treatment team regarding discharge and aftercare plan.   The patient improved gradually, and although at times he complained of  auditory hallucinations, he never appeared to give an overall picture of  true psychosis.  He was a reasonably good participant in the treatment  program.  After approximately 7 days of hospitalization, he indicated  that he was feeling better and well enough to return to his home.  He  agreed to the following aftercare plan.   AFTERCARE:  The patient was to follow up with the ACT team immediately  upon discharge.   DISCHARGE MEDICATIONS:  1. Prilosec 20 mg daily.  2. Aspirin 81 mg daily.  3. Diltiazem 120 mg daily.  4. Celexa 40 mg daily.  5. Acyclovir 800 mg 5 times daily for 10 days or through January 18, 2009.  6. Seroquel 400 mg at bedtime.  7. Zinc oxide ointment to affected areas as needed.  8. Flexeril 10 mg t.i.d. as needed for muscle spasm.  9. Trazodone 100 mg at bedtime p.r.n. insomnia.   DISCHARGE DIAGNOSES:  AXIS I:  Mood disorder, not otherwise specified,  polysubstance abuse, not otherwise specified.  AXIS II:  Deferred.  AXIS III:  Herpes simplex, gastroesophageal reflux disease, cardiac  disease, not otherwise specified.  AXIS IV:  Stressors:  Severe.  AXIS V:  Global Assessment of Functioning on discharge 55.      Anselm Jungling, MD  Electronically Signed     SPB/MEDQ  D:  01/16/2009  T:  01/16/2009  Job:  [id number]

## 2010-10-31 NOTE — H&P (Signed)
NAMECOADY, TRAIN              ACCOUNT NO.:  [id number]   MEDICAL RECORD NO.:  [id number]          PATIENT TYPE:  EMS   LOCATION:  MAJO                         FACILITY:  [facility]   PHYSICIAN:  Madolyn Frieze. Jens Som, MD, FACCDATE OF BIRTH:  12-24-1965   DATE OF ADMISSION:  10/02/2006  DATE OF DISCHARGE:  10/02/2006                              HISTORY & PHYSICAL   The patient is a 45 year old male with a past medical history of  coronary disease (status post subendocardial myocardial infarction at  [facility] in 2004 in the setting of cocaine use; he had a stent to his OM  at that time), hypertension, and depression, who presents for evaluation  of chest pain.  The patient states that he developed chest pain this  morning for approximately 15 minutes.  It was in the left chest area,  described as a sharp pain.  The pain does not radiate.  It does increase  with certain movements.  It is not pleuritic.  It is not related to  food.  He also complained of pain in his lower abdomen.  He came to  emergency room and his chest pain has resolved.  His lower abdominal  pain has also improved.  Because of his symptoms, we were asked to  further evaluate.   He has no known drug allergies.   PRESENT MEDICATIONS:  1. Hydrochlorothiazide 25 mg p.o. daily.  2. Paxil 20 mg daily.  3. Cardizem 240 mg p.o. daily.  4. Celexa 40 mg daily.  5. Seroquel 100 mg p.o. daily.  6. He had a prescription for Plavix recently filled that he has not      resumed.   SOCIAL HISTORY:  He does smoke.  He denies any alcohol or drug use at  this point.   FAMILY HISTORY:  Negative for coronary disease.   PAST MEDICAL HISTORY:  He denies any diabetes mellitus or  hyperlipidemia, but there is hypertension.  He has a history of coronary  disease in the setting of cocaine use as outlined in the HPI.  He has a  history of substance abuse.  He also states he has a history of  depression, bipolar and schizophrenia, for  which he is trying obtain  disability.   REVIEW OF SYSTEMS:  He states he has a migraine headache from his  nitroglycerin that he was just given.  There is no productive cough or  hemoptysis.  There is no dysphagia, odynophagia or melena, although he  does describe hematochezia by his report.  There is no dysuria or  hematuria, but he has had some lower abdominal pain.  There is no  claudication.  The remaining systems are negative.   PHYSICAL EXAMINATION:  VITAL SIGNS:  His physical exam shows a blood  pressure of 149/80 and his pulse is 60.  He is afebrile.  GENERAL:  He is well-developed and well-nourished, in no acute distress.  His skin is warm and dry.  He does not appear depressed and there is no  peripheral clubbing.  Note, he is somewhat angry at the time of  evaluation.  BACK:  Normal.  HEENT:  Normal with normal eyelids.  NECK:  Supple with a normal upstroke bilaterally and I cannot appreciate  bruits.  There is no jugular distension and there is no thyromegaly  noted.  CHEST:  Clear to auscultation, normal expansion.  CARDIOVASCULAR:  A regular rate and rhythm.  Normal S1 and S2.  I could  not appreciate murmurs, rubs or gallops.  His PMI is nondisplaced.  ABDOMEN:  Mild tenderness in the left lower quadrant, although there is  no rebound or guarding, and there is no distension noted.  I cannot  palpate masses or hepatosplenomegaly.  There is no abdominal bruit.  He  has 2+ femoral pulses bilaterally.  No bruits.  EXTREMITIES:  No edema.  I could palpate no cords.  He has 2+ posterior  tibial pulses bilaterally.  NEUROLOGIC:  Grossly intact.   Electrocardiogram shows a sinus rhythm with inferolateral T-wave  inversion.  I do not have one for comparison.  Chest x-ray shows  bronchitic changes per the interpretation.  The patient's sodium is 134,  his potassium is 4.2.  his BUN is 20.  His hemoglobin is 14.6.  His  creatinine is 1.4.  Initial markers are negative.  A  drug screen shows  no cocaine use.   DIAGNOSES:  1. Atypical chest pain.  The patient's symptoms are extremely atypical      but he does have a history of coronary disease status post      percutaneous coronary intervention of the obtuse marginal in 2004.      Note, his drug screen is negative.  We will admit and cycle      enzymes.  If they are negative, then we will plan a Myoview      tomorrow morning.  He will resume on his aspirin and Plavix and I      will add a statin given his history of coronary disease.  He will      need lipids and liver checked in 6 weeks with his primary care      physician.  We will continue with his Cardizem.  I will not add a      beta blocker due to his history of cocaine use and vasospasm      potentially contributing to his previous infarct.  2. Lower abdominal pain, etiology unclear.  The physical examination      is essentially benign.  We will check a urinalysis, liver      functions, as well as an amylase and lipase.  We will follow this      expectantly.  3. History of substance abuse.  Drug screen is negative and he denies      any use at this point.  4. History of depression, question bipolar/schizophrenia.  He will      continue his present medications including the Paxil, Celexa, and      Seroquel.   The patient will be discharged after his Myoview if it is unrevealing  and follow up with [facility], who is his primary care physician.      Madolyn Frieze Jens Som, MD, [facility]  Electronically Signed     BSC/MEDQ  D:  10/02/2006  T:  10/03/2006  Job:  [id number]

## 2010-11-17 IMAGING — CT CT HEAD W/O CM
1 series · 16 of 30 positions shown, 20 images · non-contrast
Comparison: 01/15/2010

CLINICAL DATA: Code stroke.  Left sided numbness.  History of
hypertension.  Stroke last month.

CT HEAD WITHOUT CONTRAST
TECHNIQUE: Contiguous axial images were obtained from the base of
the skull through the vertex without contrast.

[Series 2: head_seq 4.5 h37s st · axial · 0.48mm/px · z∈[+1073,+1221]mm · 16 of 36 slices shown, 20 images]
[im 2/36  brain]
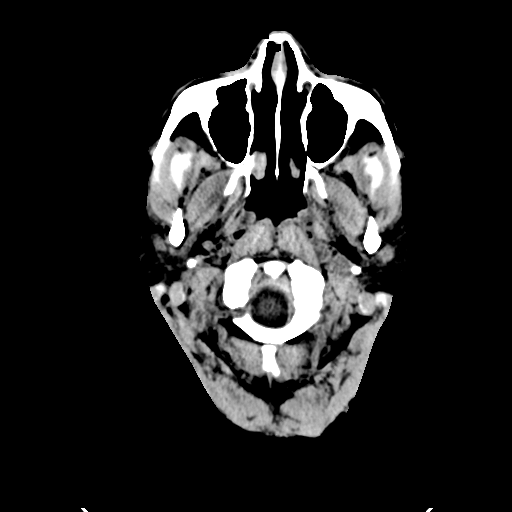
[im 2/36  bone]
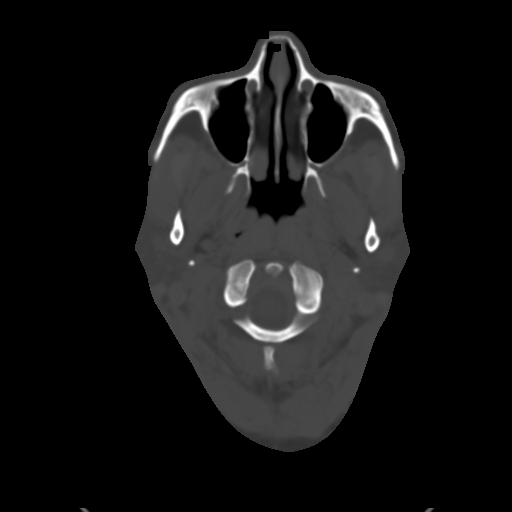
[im 4/36  brain]
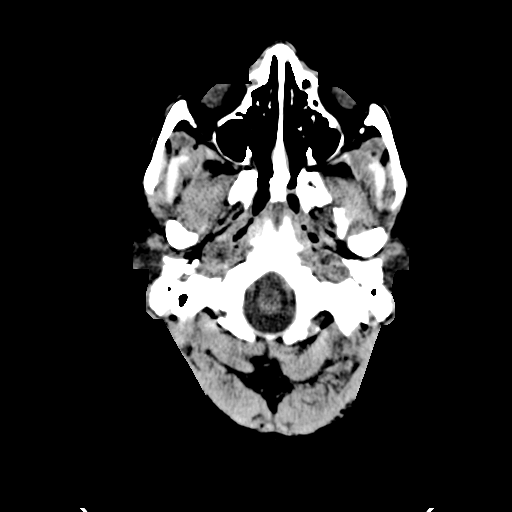
[im 7/36  brain]
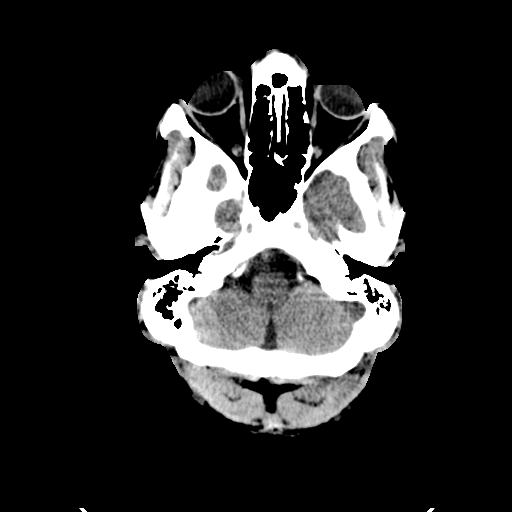
[im 9/36  brain]
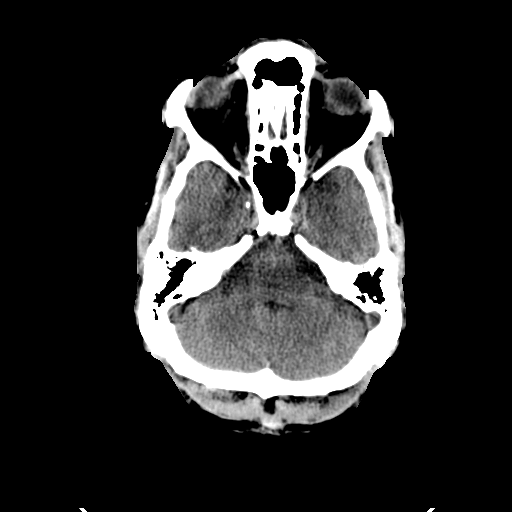
[im 10/36  brain]
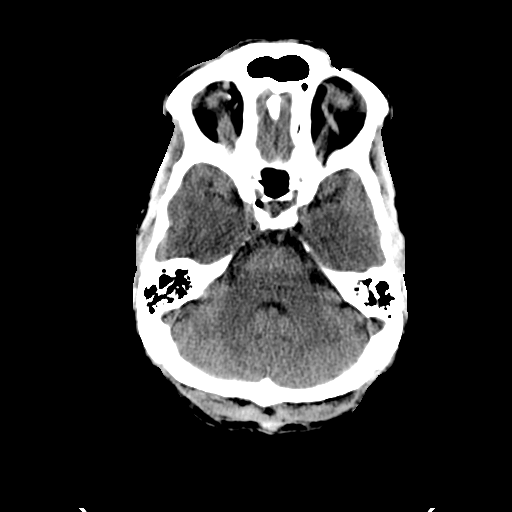
[im 10/36  bone]
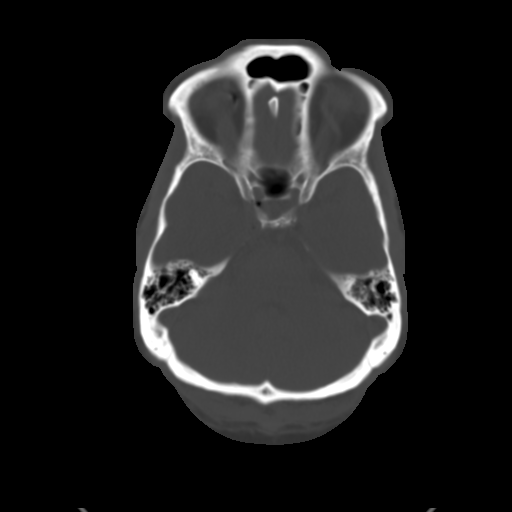
[im 13/36  brain]
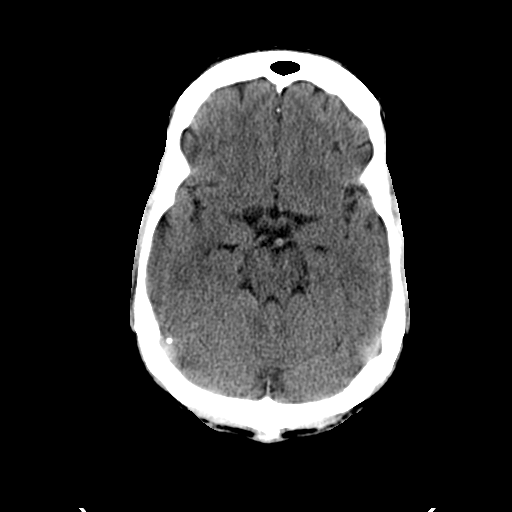
[im 15/36  brain]
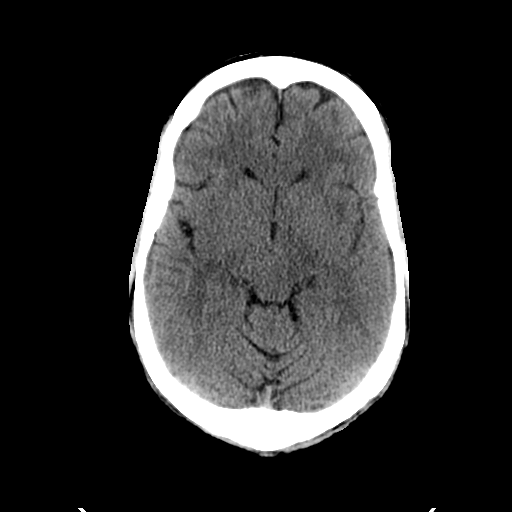
[im 17/36  brain]
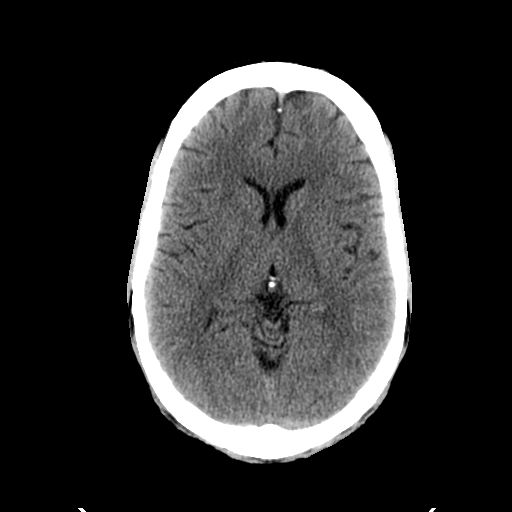
[im 19/36  brain]
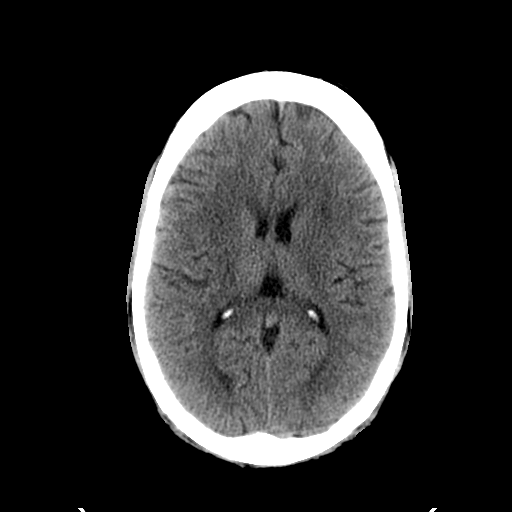
[im 19/36  bone]
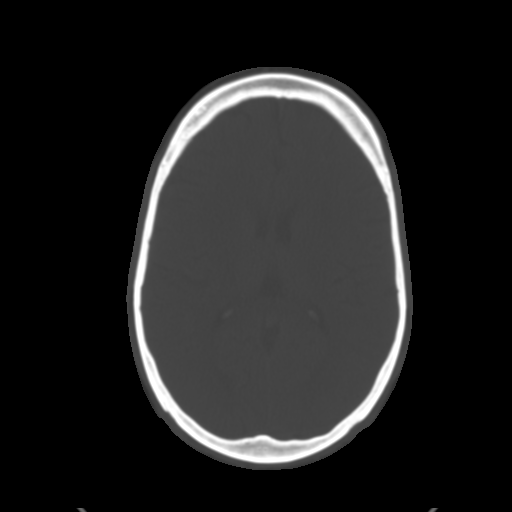
[im 21/36  brain]
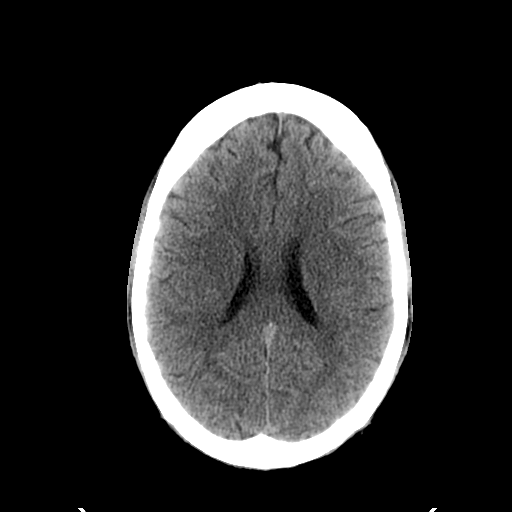
[im 23/36  brain]
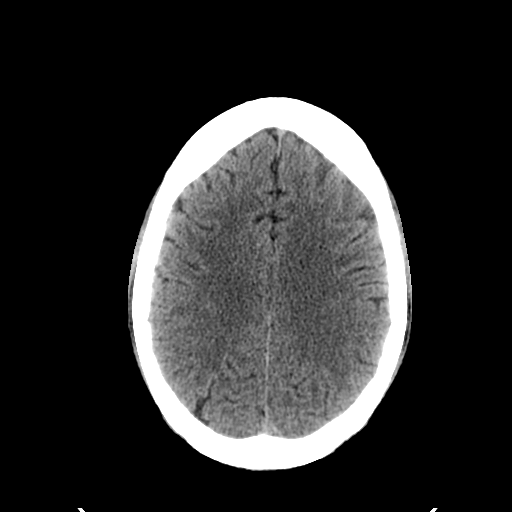
[im 26/36  brain]
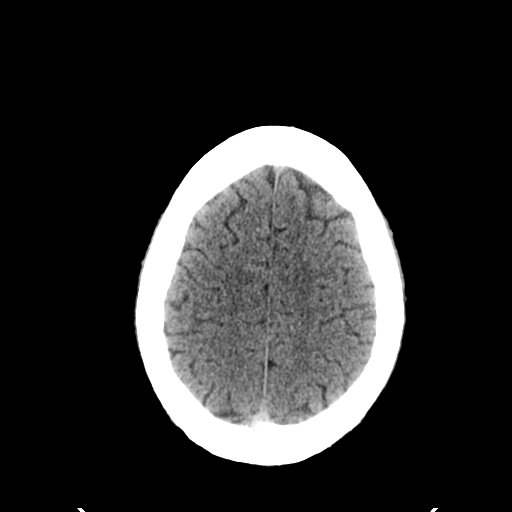
[im 27/36  brain]
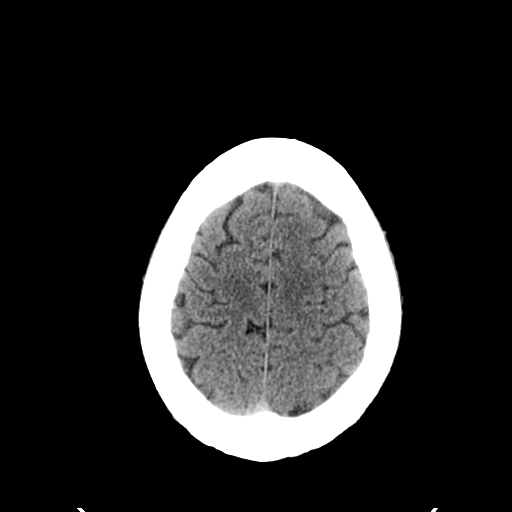
[im 27/36  bone]
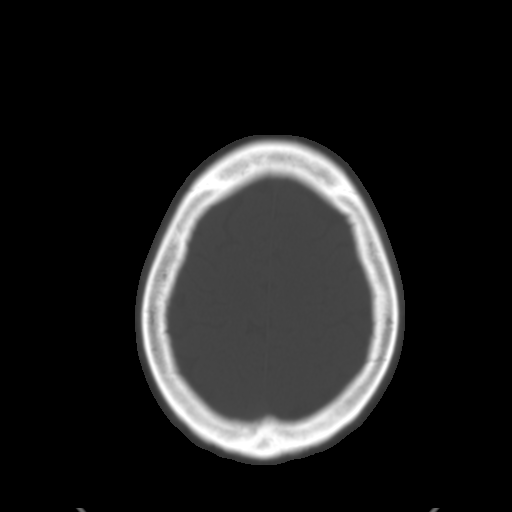
[im 29/36  brain]
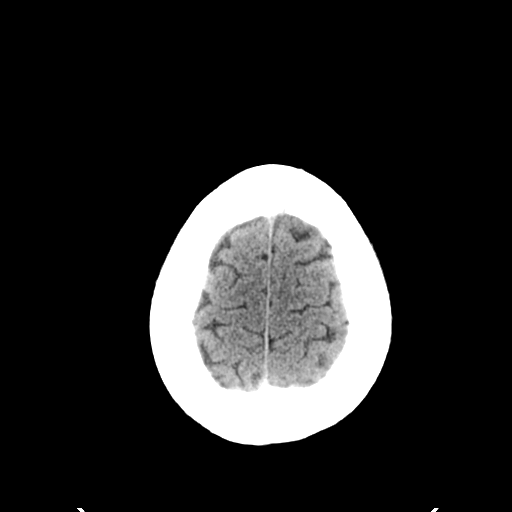
[im 32/36  brain]
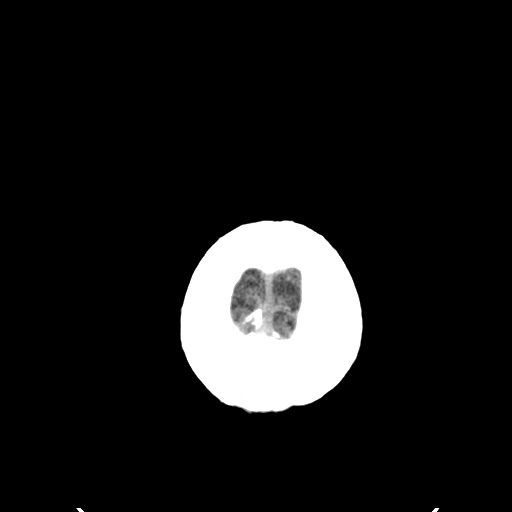
[im 34/36  brain]
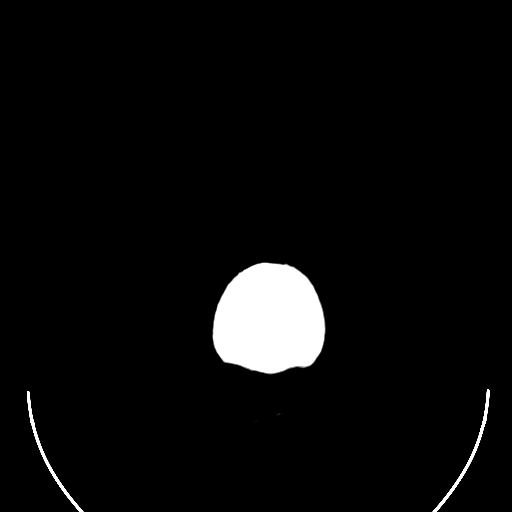

[16 of 30 positions shown; findings below may reference images not displayed]

FINDINGS: There is no intra or extra-axial fluid collection or mass
lesion.  The basilar cisterns and ventricles have a normal
appearance.  There is no CT evidence for acute infarction or
hemorrhage.

Bone windows show no acute findings
IMPRESSION: No evidence for acute intracranial abnormality.

Critical test results telephoned to Dr. Sardon at the time of

## 2011-03-16 LAB — CARDIAC PANEL(CRET KIN+CKTOT+MB+TROPI)
CK, MB: 2.8
CK, MB: 3.6
Total CK: 208

## 2011-03-16 LAB — URINE MICROSCOPIC-ADD ON

## 2011-03-16 LAB — POCT I-STAT, CHEM 8
BUN: 17
Creatinine, Ser: 1.4
Potassium: 3.5
Sodium: 141

## 2011-03-16 LAB — CK TOTAL AND CKMB (NOT AT ARMC)
CK, MB: 2.8
Relative Index: 1.7
Total CK: 161

## 2011-03-16 LAB — GLUCOSE, CAPILLARY
Glucose-Capillary: 124 — ABNORMAL HIGH
Glucose-Capillary: 81
Glucose-Capillary: 83
Glucose-Capillary: 84
Glucose-Capillary: 96
Glucose-Capillary: 98

## 2011-03-16 LAB — COMPREHENSIVE METABOLIC PANEL
Albumin: 4.4
BUN: 16
Calcium: 10
Creatinine, Ser: 1.42
Total Bilirubin: 1
Total Protein: 8.1

## 2011-03-16 LAB — URINE CULTURE
Colony Count: NO GROWTH
Culture: NO GROWTH

## 2011-03-16 LAB — CBC
HCT: 35.7 — ABNORMAL LOW
HCT: 36.8 — ABNORMAL LOW
HCT: 40.8
Hemoglobin: 12.6 — ABNORMAL LOW
MCV: 91.6
Platelets: 196
Platelets: 257
Platelets: 280
RBC: 4.46
RDW: 14.9
RDW: 15.2
WBC: 23.6 — ABNORMAL HIGH
WBC: 23.7 — ABNORMAL HIGH
WBC: 6.9
WBC: 8

## 2011-03-16 LAB — POCT CARDIAC MARKERS
CKMB, poc: 2.1
Troponin i, poc: <0.05

## 2011-03-16 LAB — BASIC METABOLIC PANEL
BUN: 14
Calcium: 8.8
GFR calc non Af Amer: >60
GFR calc non Af Amer: >60
Glucose, Bld: 105 — ABNORMAL HIGH
Potassium: 3.8
Potassium: 3.8
Sodium: 138
Sodium: 139

## 2011-03-16 LAB — DIFFERENTIAL
Basophils Absolute: 0
Lymphocytes Relative: 5 — ABNORMAL LOW
Lymphs Abs: 1.1
Monocytes Absolute: 1.6 — ABNORMAL HIGH
Monocytes Relative: 7
Neutro Abs: 20.8 — ABNORMAL HIGH

## 2011-03-16 LAB — URINALYSIS, ROUTINE W REFLEX MICROSCOPIC
Bilirubin Urine: NEGATIVE
Bilirubin Urine: NEGATIVE
Glucose, UA: >1000 — AB
Glucose, UA: NEGATIVE
Glucose, UA: NEGATIVE
Ketones, ur: 15 — AB
Ketones, ur: NEGATIVE
pH: 5.5
pH: 6
pH: 6.5

## 2011-03-16 LAB — RAPID URINE DRUG SCREEN, HOSP PERFORMED
Cocaine: POSITIVE — AB
Opiates: NOT DETECTED

## 2011-03-16 LAB — GC/CHLAMYDIA PROBE AMP, URINE
Chlamydia, Swab/Urine, PCR: NEGATIVE
GC Probe Amp, Urine: NEGATIVE

## 2011-03-16 LAB — RPR: RPR Ser Ql: NONREACTIVE

## 2011-03-18 LAB — RAPID URINE DRUG SCREEN, HOSP PERFORMED
Amphetamines: NOT DETECTED
Barbiturates: NOT DETECTED
Opiates: NOT DETECTED
Tetrahydrocannabinol: NOT DETECTED

## 2011-03-18 LAB — DIFFERENTIAL
Basophils Absolute: 0
Lymphocytes Relative: 10 — ABNORMAL LOW
Lymphs Abs: 1.8
Neutro Abs: 14.4 — ABNORMAL HIGH
Neutrophils Relative %: 82 — ABNORMAL HIGH

## 2011-03-18 LAB — HEPATIC FUNCTION PANEL
ALT: 153 — ABNORMAL HIGH
ALT: 167 — ABNORMAL HIGH
ALT: 263 — ABNORMAL HIGH
AST: 62 — ABNORMAL HIGH
Albumin: 3.3 — ABNORMAL LOW
Bilirubin, Direct: 0.1
Bilirubin, Direct: <0.1
Indirect Bilirubin: 0.6
Total Bilirubin: 0.7
Total Protein: 6.2

## 2011-03-18 LAB — CBC
HCT: 35.8 — ABNORMAL LOW
HCT: 37.8 — ABNORMAL LOW
HCT: 38 — ABNORMAL LOW
Hemoglobin: 12.2 — ABNORMAL LOW
Hemoglobin: 12.9 — ABNORMAL LOW
MCHC: 34
MCV: 91.8
Platelets: 190
Platelets: 218
Platelets: 227
RBC: 4.14 — ABNORMAL LOW
RBC: 4.37
RDW: 14.1
RDW: 14.1
RDW: 14.4
RDW: 14.6
WBC: 17.7 — ABNORMAL HIGH
WBC: 6.6
WBC: 7.7

## 2011-03-18 LAB — COMPREHENSIVE METABOLIC PANEL
ALT: 282 — ABNORMAL HIGH
AST: 141 — ABNORMAL HIGH
Albumin: 3.5
Alkaline Phosphatase: 80
BUN: 12
CO2: 25
CO2: 26
Chloride: 108
Chloride: 109
GFR calc Af Amer: >60
GFR calc non Af Amer: 56 — ABNORMAL LOW
GFR calc non Af Amer: 58 — ABNORMAL LOW
Glucose, Bld: 140 — ABNORMAL HIGH
Potassium: 4.1
Sodium: 140
Total Bilirubin: 0.7
Total Bilirubin: 0.7

## 2011-03-18 LAB — URINE CULTURE: Culture: NO GROWTH

## 2011-03-18 LAB — BASIC METABOLIC PANEL
BUN: 14
BUN: 14
BUN: 23
Calcium: 8.3 — ABNORMAL LOW
Calcium: 9.7
Chloride: 109
Creatinine, Ser: 1.39
GFR calc non Af Amer: 56 — ABNORMAL LOW
GFR calc non Af Amer: >60
Glucose, Bld: 112 — ABNORMAL HIGH
Glucose, Bld: 130 — ABNORMAL HIGH
Glucose, Bld: 94
Potassium: 3.7
Potassium: 4.1
Sodium: 138

## 2011-03-18 LAB — URINE MICROSCOPIC-ADD ON

## 2011-03-18 LAB — POCT CARDIAC MARKERS
CKMB, poc: 2
Myoglobin, poc: 140

## 2011-03-18 LAB — PROTIME-INR: Prothrombin Time: 14

## 2011-03-18 LAB — B-NATRIURETIC PEPTIDE (CONVERTED LAB): Pro B Natriuretic peptide (BNP): 90

## 2011-03-18 LAB — CARDIAC PANEL(CRET KIN+CKTOT+MB+TROPI)
CK, MB: 2.7
Total CK: 169

## 2011-03-18 LAB — D-DIMER, QUANTITATIVE: D-Dimer, Quant: 0.27

## 2011-03-18 LAB — URINALYSIS, ROUTINE W REFLEX MICROSCOPIC
Hgb urine dipstick: NEGATIVE
Nitrite: NEGATIVE
Specific Gravity, Urine: 1.028
Urobilinogen, UA: 1
pH: 5.5

## 2011-03-18 LAB — HEPATITIS PANEL, ACUTE: Hepatitis B Surface Ag: NEGATIVE

## 2011-03-18 LAB — OCCULT BLOOD X 1 CARD TO LAB, STOOL: Fecal Occult Bld: NEGATIVE

## 2011-03-18 LAB — LIPID PANEL: VLDL: 15

## 2011-03-18 LAB — TSH: TSH: 0.683

## 2011-03-18 LAB — ETHANOL: Alcohol, Ethyl (B): <5

## 2011-03-18 LAB — CK TOTAL AND CKMB (NOT AT ARMC)
CK, MB: 2.6
Relative Index: 1.4

## 2011-03-18 LAB — SODIUM, URINE, RANDOM: Sodium, Ur: 55

## 2011-04-21 ENCOUNTER — Telehealth: Payer: Self-pay | Admitting: Cardiology

## 2011-04-21 NOTE — Telephone Encounter (Signed)
LOV (2008) faxed to [facility] [phone]  04/21/11/km

## 2014-08-14 ENCOUNTER — Other Ambulatory Visit (HOSPITAL_COMMUNITY): Payer: Self-pay | Admitting: Nurse Practitioner

## 2014-08-14 DIAGNOSIS — B182 Chronic viral hepatitis C: Secondary | ICD-10-CM

## 2014-08-15 ENCOUNTER — Ambulatory Visit (HOSPITAL_COMMUNITY)
Admission: RE | Admit: 2014-08-15 | Discharge: 2014-08-15 | Disposition: A | Discharge status: Home / Self Care | Payer: Medicare Other | Source: Ambulatory Visit | Attending: Nurse Practitioner | Admitting: Nurse Practitioner

## 2014-08-15 DIAGNOSIS — B182 Chronic viral hepatitis C: Secondary | ICD-10-CM

## 2014-09-19 ENCOUNTER — Ambulatory Visit (HOSPITAL_COMMUNITY): Payer: Self-pay

## 2016-11-23 ENCOUNTER — Encounter (HOSPITAL_COMMUNITY): Payer: Self-pay | Admitting: *Deleted

## 2016-11-23 ENCOUNTER — Emergency Department (HOSPITAL_COMMUNITY)
Admission: EM | Admit: 2016-11-23 | Discharge: 2016-11-25 | Disposition: A | Discharge status: Other Acute Inpatient Hospital | Payer: Medicare Other | Attending: Emergency Medicine | Admitting: Emergency Medicine

## 2016-11-23 DIAGNOSIS — F17200 Nicotine dependence, unspecified, uncomplicated: Secondary | ICD-10-CM | POA: Diagnosis not present

## 2016-11-23 DIAGNOSIS — Z79899 Other long term (current) drug therapy: Secondary | ICD-10-CM | POA: Insufficient documentation

## 2016-11-23 DIAGNOSIS — Z955 Presence of coronary angioplasty implant and graft: Secondary | ICD-10-CM | POA: Insufficient documentation

## 2016-11-23 DIAGNOSIS — R519 Headache, unspecified: Secondary | ICD-10-CM

## 2016-11-23 DIAGNOSIS — R51 Headache: Secondary | ICD-10-CM | POA: Diagnosis present

## 2016-11-23 DIAGNOSIS — J45909 Unspecified asthma, uncomplicated: Secondary | ICD-10-CM | POA: Insufficient documentation

## 2016-11-23 DIAGNOSIS — F1410 Cocaine abuse, uncomplicated: Secondary | ICD-10-CM | POA: Diagnosis not present

## 2016-11-23 DIAGNOSIS — I10 Essential (primary) hypertension: Secondary | ICD-10-CM | POA: Diagnosis not present

## 2016-11-23 HISTORY — DX: Gastro-esophageal reflux disease without esophagitis: K21.9

## 2016-11-23 HISTORY — DX: Cerebral infarction, unspecified: I63.9

## 2016-11-23 HISTORY — DX: Essential (primary) hypertension: I10

## 2016-11-23 HISTORY — DX: Acute myocardial infarction, unspecified: I21.9

## 2016-11-23 HISTORY — DX: Unspecified asthma, uncomplicated: J45.909

## 2016-11-23 LAB — CBC
HCT: 37.7 % — ABNORMAL LOW (ref 39.0–52.0)
HEMOGLOBIN: 13 g/dL (ref 13.0–17.0)
MCH: 31.1 pg (ref 26.0–34.0)
MCHC: 34.5 g/dL (ref 30.0–36.0)
MCV: 90.2 fL (ref 78.0–100.0)
PLATELETS: 195 10*3/uL (ref 150–400)
RBC: 4.18 MIL/uL — ABNORMAL LOW (ref 4.22–5.81)
RDW: 13.7 % (ref 11.5–15.5)
WBC: 9.1 10*3/uL (ref 4.0–10.5)

## 2016-11-23 LAB — COMPREHENSIVE METABOLIC PANEL
ALBUMIN: 3.9 g/dL (ref 3.5–5.0)
ALK PHOS: 75 U/L (ref 38–126)
ALT: 11 U/L — AB (ref 17–63)
ANION GAP: 8 (ref 5–15)
AST: 18 U/L (ref 15–41)
BILIRUBIN TOTAL: 1 mg/dL (ref 0.3–1.2)
BUN: 31 mg/dL — AB (ref 6–20)
CALCIUM: 9.2 mg/dL (ref 8.9–10.3)
CO2: 23 mmol/L (ref 22–32)
CREATININE: 1.66 mg/dL — AB (ref 0.61–1.24)
Chloride: 108 mmol/L (ref 101–111)
GFR calc Af Amer: 54 mL/min — ABNORMAL LOW (ref >60)
GFR calc non Af Amer: 46 mL/min — ABNORMAL LOW (ref >60)
GLUCOSE: 103 mg/dL — AB (ref 65–99)
Potassium: 3.3 mmol/L — ABNORMAL LOW (ref 3.5–5.1)
SODIUM: 139 mmol/L (ref 135–145)
TOTAL PROTEIN: 6.8 g/dL (ref 6.5–8.1)

## 2016-11-23 LAB — RAPID URINE DRUG SCREEN, HOSP PERFORMED
Amphetamines: NOT DETECTED
Barbiturates: NOT DETECTED
Benzodiazepines: NOT DETECTED
COCAINE: POSITIVE — AB
OPIATES: NOT DETECTED
TETRAHYDROCANNABINOL: NOT DETECTED

## 2016-11-23 LAB — URINALYSIS, ROUTINE W REFLEX MICROSCOPIC
Bacteria, UA: NONE SEEN
Bilirubin Urine: NEGATIVE
Glucose, UA: 50 mg/dL — AB
Hgb urine dipstick: NEGATIVE
Ketones, ur: NEGATIVE mg/dL
Leukocytes, UA: NEGATIVE
Nitrite: NEGATIVE
Protein, ur: 30 mg/dL — AB
Specific Gravity, Urine: 1.013 (ref 1.005–1.030)
pH: 6 (ref 5.0–8.0)

## 2016-11-23 LAB — ETHANOL: Alcohol, Ethyl (B): <5 mg/dL (ref <5)

## 2016-11-23 MED ORDER — CLONIDINE HCL 0.2 MG PO TABS
0.2000 mg | ORAL_TABLET | Freq: Once | ORAL | Status: AC
  Administered 2016-11-23: 0.2 mg via ORAL
  Filled 2016-11-23 (×2): qty 1

## 2016-11-23 MED ORDER — POTASSIUM CHLORIDE CRYS ER 20 MEQ PO TBCR
20.0000 meq | EXTENDED_RELEASE_TABLET | Freq: Once | ORAL | Status: AC
  Administered 2016-11-24: 20 meq via ORAL
  Filled 2016-11-23: qty 1
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
## 2016-11-23 NOTE — ED Notes (Signed)
Beverely Low with [facility]: [phone], tele-assessment to be done when patient can be in private room

## 2016-11-23 NOTE — ED Notes (Signed)
Pt taken to room C22 to have telepsych assessment done. Pt also given [place] sandwich, graham crackers and two cartons of milk upon request.
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
## 2016-11-23 NOTE — BH Assessment (Signed)
[place] Assessment Progress Note   Clinician spoke to [place], RN regarding patient being assessed.  Patient is now in the hallway.  She will call clinician back at [phone] when she can get patient in a private room for assessment.
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
## 2016-11-24 DIAGNOSIS — F1410 Cocaine abuse, uncomplicated: Secondary | ICD-10-CM | POA: Diagnosis not present

## 2016-11-24 MED ORDER — AMLODIPINE BESYLATE 5 MG PO TABS
10.0000 mg | ORAL_TABLET | Freq: Every day | ORAL | Status: DC
  Administered 2016-11-24 – 2016-11-25 (×2): 10 mg via ORAL
  Filled 2016-11-24 (×2): qty 2

## 2016-11-24 NOTE — ED Notes (Signed)
Pt in pod C for TTS assessment

## 2016-11-24 NOTE — ED Notes (Addendum)
Double Portion,Regular Diet was ordered for Lunch, and Patient was given a snack and drink.

## 2016-11-24 NOTE — ED Notes (Signed)
Spoke to [place] at [facility], reported that recommendation is inpatient placement.

## 2016-11-24 NOTE — ED Notes (Signed)
Patient calling mother to inform her not to come to hospital.

## 2016-11-24 NOTE — ED Notes (Signed)
Patient belongings placed in [place] #9, with Two bags.

## 2016-11-24 NOTE — ED Notes (Addendum)
Patient placed in psych scrubs. Patient being wanded at this time.

## 2016-11-24 NOTE — ED Notes (Signed)
Pt ate all of his lunch (double portions) and inquired about re-assessment.  RN updated him on the situation.

## 2016-11-24 NOTE — ED Notes (Signed)
Patient was given a snack and drink, and A Regular Diet was ordered for Dinner. 

## 2016-11-24 NOTE — Progress Notes (Signed)
Pt meets in-patient criteria per Zerita Boers, NP.    Pt referrals sent to the following: [facility],  [place],  [place] [facility],  [place],   [place], [place],  [place],  [place],  [place] T. Judi Cong, MSW, [place] Work Disposition [phone]

## 2016-11-24 NOTE — ED Notes (Signed)
Patient stated to nurse "I'm not going to kill myself. You just have to say certain things in order to get into an inpatient center, and that's what I want." Patient waiting for a.m. TSS.

## 2016-11-24 NOTE — ED Notes (Signed)
Charge nurse states to move patient to A3 when room is empty, and for sitter to watch patients in [place].

## 2016-11-24 NOTE — ED Notes (Signed)
Pt to shower w/ sitter

## 2016-11-24 NOTE — ED Notes (Signed)
Pt given milk and graham crackers for snack.
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
## 2016-11-24 NOTE — ED Notes (Signed)
Provider approved double portion meals

## 2016-11-24 NOTE — ED Notes (Signed)
Per Koren Bound, pt is scheduled to be reassessed today.

## 2016-11-25 DIAGNOSIS — F1410 Cocaine abuse, uncomplicated: Secondary | ICD-10-CM | POA: Diagnosis not present

## 2016-11-25 MED ORDER — CLONIDINE HCL 0.2 MG PO TABS
0.2000 mg | ORAL_TABLET | Freq: Once | ORAL | Status: AC
  Administered 2016-11-25: 0.2 mg via ORAL
  Filled 2016-11-25: qty 1

## 2016-11-25 NOTE — ED Notes (Addendum)
Report given to [place] at [facility] called for transportation.

## 2016-11-25 NOTE — ED Notes (Signed)
Transportation arrived for patient, patient remains hypertensive, asymptomatic. edp notified with no further orders. Transportation states to call back when the patient is again ready to go. Will continue to monitor.

## 2016-11-25 NOTE — ED Provider Notes (Signed)
  Physical Exam  BP (!) 184/115 Comment: edp notified, see mar  Pulse 61   Temp 97.7 F (36.5 C) (Oral)   Resp 20   SpO2 97%   Physical Exam  ED Course  Procedures  MDM Patient accepted [facility]. Had been given clonidine for at least blood pressure. Had been off his medications which is been started back up. Creatinine mildly increased but unsure of baseline. Will need follow-up but does not need admission for this. Will see if [facility] will still accept.       Davonna Belling, MD 11/25/16 1247

## 2016-11-25 NOTE — ED Notes (Signed)
Pt. Stated, Unable to sleep all night cause I was having to get up to go to bathroom.

## 2016-11-25 NOTE — ED Notes (Signed)
pts blood pressure is now normal, transportation called and is coming back to get patient.

## 2016-11-25 NOTE — ED Notes (Signed)
E-signature not available, pt signed paper copy of emtala

## 2016-11-25 NOTE — ED Notes (Signed)
Call received from [facility].  Patient accepted after 7am.  Accepting physician is Dr. Reece Levy.  Report number is [phone].

## 2017-06-27 ENCOUNTER — Emergency Department (HOSPITAL_COMMUNITY): Payer: Medicare Other

## 2017-06-27 ENCOUNTER — Encounter (HOSPITAL_COMMUNITY): Payer: Self-pay | Admitting: Emergency Medicine

## 2017-06-27 ENCOUNTER — Other Ambulatory Visit: Payer: Self-pay

## 2017-06-27 ENCOUNTER — Emergency Department (HOSPITAL_COMMUNITY)
Admission: EM | Admit: 2017-06-27 | Discharge: 2017-06-27 | Disposition: A | Discharge status: Home / Self Care | Payer: Medicare Other | Attending: Emergency Medicine | Admitting: Emergency Medicine

## 2017-06-27 DIAGNOSIS — I10 Essential (primary) hypertension: Secondary | ICD-10-CM | POA: Diagnosis not present

## 2017-06-27 DIAGNOSIS — R29810 Facial weakness: Secondary | ICD-10-CM | POA: Diagnosis present

## 2017-06-27 DIAGNOSIS — J45909 Unspecified asthma, uncomplicated: Secondary | ICD-10-CM | POA: Insufficient documentation

## 2017-06-27 DIAGNOSIS — F1410 Cocaine abuse, uncomplicated: Secondary | ICD-10-CM | POA: Diagnosis not present

## 2017-06-27 DIAGNOSIS — I252 Old myocardial infarction: Secondary | ICD-10-CM | POA: Insufficient documentation

## 2017-06-27 DIAGNOSIS — G510 Bell's palsy: Secondary | ICD-10-CM | POA: Diagnosis not present

## 2017-06-27 DIAGNOSIS — Z8673 Personal history of transient ischemic attack (TIA), and cerebral infarction without residual deficits: Secondary | ICD-10-CM | POA: Diagnosis not present

## 2017-06-27 DIAGNOSIS — F17210 Nicotine dependence, cigarettes, uncomplicated: Secondary | ICD-10-CM | POA: Insufficient documentation

## 2017-06-27 DIAGNOSIS — H9202 Otalgia, left ear: Secondary | ICD-10-CM | POA: Insufficient documentation

## 2017-06-27 LAB — COMPREHENSIVE METABOLIC PANEL
ALBUMIN: 3.8 g/dL (ref 3.5–5.0)
ALT: 10 U/L — AB (ref 17–63)
AST: 18 U/L (ref 15–41)
Alkaline Phosphatase: 71 U/L (ref 38–126)
Anion gap: 10 (ref 5–15)
BILIRUBIN TOTAL: 0.8 mg/dL (ref 0.3–1.2)
BUN: 22 mg/dL — AB (ref 6–20)
CALCIUM: 9 mg/dL (ref 8.9–10.3)
CO2: 20 mmol/L — AB (ref 22–32)
CREATININE: 1.59 mg/dL — AB (ref 0.61–1.24)
Chloride: 106 mmol/L (ref 101–111)
GFR calc Af Amer: 56 mL/min — ABNORMAL LOW (ref >60)
GFR calc non Af Amer: 49 mL/min — ABNORMAL LOW (ref >60)
GLUCOSE: 107 mg/dL — AB (ref 65–99)
Potassium: 3.7 mmol/L (ref 3.5–5.1)
SODIUM: 136 mmol/L (ref 135–145)
TOTAL PROTEIN: 6.7 g/dL (ref 6.5–8.1)

## 2017-06-27 LAB — RAPID URINE DRUG SCREEN, HOSP PERFORMED
AMPHETAMINES: NOT DETECTED
BARBITURATES: NOT DETECTED
Benzodiazepines: NOT DETECTED
Cocaine: POSITIVE — AB
Opiates: NOT DETECTED
TETRAHYDROCANNABINOL: NOT DETECTED

## 2017-06-27 LAB — URINALYSIS, ROUTINE W REFLEX MICROSCOPIC
BILIRUBIN URINE: NEGATIVE
GLUCOSE, UA: NEGATIVE mg/dL
HGB URINE DIPSTICK: NEGATIVE
Ketones, ur: NEGATIVE mg/dL
Leukocytes, UA: NEGATIVE
NITRITE: NEGATIVE
PH: 5 (ref 5.0–8.0)
Protein, ur: 30 mg/dL — AB
SPECIFIC GRAVITY, URINE: 1.025 (ref 1.005–1.030)

## 2017-06-27 LAB — DIFFERENTIAL
Basophils Absolute: 0 10*3/uL (ref 0.0–0.1)
Basophils Relative: 1 %
Eosinophils Absolute: 0.2 10*3/uL (ref 0.0–0.7)
Eosinophils Relative: 3 %
LYMPHS ABS: 2.3 10*3/uL (ref 0.7–4.0)
Lymphocytes Relative: 31 %
Monocytes Absolute: 0.6 10*3/uL (ref 0.1–1.0)
Monocytes Relative: 8 %
NEUTROS ABS: 4.1 10*3/uL (ref 1.7–7.7)
NEUTROS PCT: 57 %

## 2017-06-27 LAB — I-STAT CHEM 8, ED
BUN: 25 mg/dL — ABNORMAL HIGH (ref 6–20)
CALCIUM ION: 1.12 mmol/L — AB (ref 1.15–1.40)
CHLORIDE: 105 mmol/L (ref 101–111)
CREATININE: 1.6 mg/dL — AB (ref 0.61–1.24)
Glucose, Bld: 104 mg/dL — ABNORMAL HIGH (ref 65–99)
HCT: 39 % (ref 39.0–52.0)
Hemoglobin: 13.3 g/dL (ref 13.0–17.0)
Potassium: 3.7 mmol/L (ref 3.5–5.1)
Sodium: 139 mmol/L (ref 135–145)
TCO2: 22 mmol/L (ref 22–32)

## 2017-06-27 LAB — CBC
HCT: 39.4 % (ref 39.0–52.0)
HEMOGLOBIN: 13.7 g/dL (ref 13.0–17.0)
MCH: 31.3 pg (ref 26.0–34.0)
MCHC: 34.8 g/dL (ref 30.0–36.0)
MCV: 90 fL (ref 78.0–100.0)
PLATELETS: 191 10*3/uL (ref 150–400)
RBC: 4.38 MIL/uL (ref 4.22–5.81)
RDW: 13.4 % (ref 11.5–15.5)
WBC: 7.2 10*3/uL (ref 4.0–10.5)

## 2017-06-27 LAB — PROTIME-INR
INR: 1.02
PROTHROMBIN TIME: 13.3 s (ref 11.4–15.2)

## 2017-06-27 LAB — APTT: aPTT: 39 seconds — ABNORMAL HIGH (ref 24–36)

## 2017-06-27 LAB — I-STAT TROPONIN, ED: Troponin i, poc: 0.01 ng/mL (ref 0.00–0.08)

## 2017-06-27 LAB — ETHANOL: Alcohol, Ethyl (B): <10 mg/dL (ref <10)

## 2017-06-27 MED ORDER — VALACYCLOVIR HCL 1 G PO TABS: 1000.0000 mg | ORAL_TABLET | Freq: Three times a day (TID) | ORAL | 0 refills | Status: DC

## 2017-06-27 MED ORDER — CLONIDINE HCL 0.1 MG PO TABS
0.1000 mg | ORAL_TABLET | Freq: Once | ORAL | Status: AC
  Administered 2017-06-27: 0.1 mg via ORAL
  Filled 2017-06-27: qty 1

## 2017-06-27 MED ORDER — PREDNISONE 20 MG PO TABS: ORAL_TABLET | ORAL | 0 refills | Status: DC

## 2017-06-27 NOTE — ED Provider Notes (Signed)
[facility] EMERGENCY DEPARTMENT Provider Note   CSN: [id number] Arrival date & time: 06/27/17  9379     History   Chief Complaint No chief complaint on file. Seen on arrival level 5 caveat code stroke.  Acuity of situation Chief complaint facial drooping HPI [name] is a 52 y.o. male.  Awakened this morning to find his right face drooping.  He denies weakness in arms or legs..  Denies treatment prior to coming here.  Brought by EMS.  He also complains of left ear pain for the last 2 days.  Denies any fever.  no other associated symptoms.  HPI  Past Medical History:  Diagnosis Date  . Asthma   . GERD (gastroesophageal reflux disease)   . Hypertension   . MI (myocardial infarction) ([place])   . 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   Past Surgical History:  Procedure Laterality Date  . CORONARY STENT PLACEMENT         Home Medications    Prior to Admission medications   Medication Sig Start Date End Date Taking? Authorizing Provider  amLODipine (NORVASC) 10 MG tablet Take 10 mg by mouth daily.    [provider]  Medications clonidine, meloxicam, Ranexa, trazodone, tamsuoslin, risperidone benztropine,escitopram.  Patient no longer on amlodipine Family History No family history on file.  Social History Social History   Tobacco Use  . Smoking status: Current Every Day Smoker  . Smokeless tobacco: Current User  Substance Use Topics  . Alcohol use: No  . Drug use: Yes  Denies drug use  Medications Allergies   Penicillins   Review of Systems Review of Systems  Unable to perform ROS: Acuity of condition  HENT: Positive for ear pain.        Facial droop  Musculoskeletal: Positive for gait problem.       Walks with cane  Neurological: Positive for weakness.       Left arm and left leg weakness, chronic     Physical Exam Updated  Vital Signs There were no vitals taken for this visit.  Physical Exam  Constitutional: He appears well-developed and well-nourished.  HENT:  Head: Normocephalic and atraumatic.  Left-sided facial droop.  He is unable to close left eye completely.  Left-sided mouth droop bilateral tympanic membranes normal  Eyes: Conjunctivae are normal. Pupils are equal, round, and reactive to light.  Neck: Neck supple. No tracheal deviation present. No thyromegaly present.  No bruit  Cardiovascular: Normal rate and regular rhythm.  No murmur heard. Pulmonary/Chest: Effort normal and breath sounds normal.  Abdominal: Soft. Bowel sounds are normal. He exhibits no distension. There is no tenderness.  Musculoskeletal: Normal range of motion. He exhibits no edema or tenderness.  Neurological: He is alert. Coordination normal.  DTR symmetric bilaterally knee jerk ankle jerk and biceps was overgrown bilaterally.  Motor strength right upper extremity and right lower extremity 5/5 motor strength of left lower extremity and left upper extremity 4/5  Skin: Skin is warm and dry. No rash noted.  Psychiatric: He has a normal mood and affect.  Nursing note and vitals reviewed.    ED Treatments / Results  Labs (all labs ordered are listed, but only abnormal results are displayed) Labs Reviewed  ETHANOL  PROTIME-INR  APTT  CBC  DIFFERENTIAL  COMPREHENSIVE METABOLIC PANEL  RAPID URINE DRUG SCREEN, HOSP PERFORMED  URINALYSIS, ROUTINE  W REFLEX MICROSCOPIC  I-STAT CHEM 8, ED  I-STAT TROPONIN, ED   Results for orders placed or performed during the hospital encounter of 06/27/17  Ethanol  Result Value Ref Range   Alcohol, Ethyl (B) <10 <10 mg/dL  Protime-INR  Result Value Ref Range   Prothrombin Time 13.3 11.4 - 15.2 seconds   INR 1.02   APTT  Result Value Ref Range   aPTT 39 (H) 24 - 36 seconds  CBC  Result Value Ref Range   WBC 7.2 4.0 - 10.5 K/uL   RBC 4.38 4.22 - 5.81 MIL/uL   Hemoglobin 13.7 13.0  - 17.0 g/dL   HCT 39.4 39.0 - 52.0 %   MCV 90.0 78.0 - 100.0 fL   MCH 31.3 26.0 - 34.0 pg   MCHC 34.8 30.0 - 36.0 g/dL   RDW 13.4 11.5 - 15.5 %   Platelets 191 150 - 400 K/uL  Differential  Result Value Ref Range   Neutrophils Relative % 57 %   Neutro Abs 4.1 1.7 - 7.7 K/uL   Lymphocytes Relative 31 %   Lymphs Abs 2.3 0.7 - 4.0 K/uL   Monocytes Relative 8 %   Monocytes Absolute 0.6 0.1 - 1.0 K/uL   Eosinophils Relative 3 %   Eosinophils Absolute 0.2 0.0 - 0.7 K/uL   Basophils Relative 1 %   Basophils Absolute 0.0 0.0 - 0.1 K/uL  Comprehensive metabolic panel  Result Value Ref Range   Sodium 136 135 - 145 mmol/L   Potassium 3.7 3.5 - 5.1 mmol/L   Chloride 106 101 - 111 mmol/L   CO2 20 (L) 22 - 32 mmol/L   Glucose, Bld 107 (H) 65 - 99 mg/dL   BUN 22 (H) 6 - 20 mg/dL   Creatinine, Ser 1.59 (H) 0.61 - 1.24 mg/dL   Calcium 9.0 8.9 - 10.3 mg/dL   Total Protein 6.7 6.5 - 8.1 g/dL   Albumin 3.8 3.5 - 5.0 g/dL   AST 18 15 - 41 U/L   ALT 10 (L) 17 - 63 U/L   Alkaline Phosphatase 71 38 - 126 U/L   Total Bilirubin 0.8 0.3 - 1.2 mg/dL   GFR calc non Af Amer 49 (L) >60 mL/min   GFR calc Af Amer 56 (L) >60 mL/min   Anion gap 10 5 - 15  I-Stat Chem 8, ED  Result Value Ref Range   Sodium 139 135 - 145 mmol/L   Potassium 3.7 3.5 - 5.1 mmol/L   Chloride 105 101 - 111 mmol/L   BUN 25 (H) 6 - 20 mg/dL   Creatinine, Ser 1.60 (H) 0.61 - 1.24 mg/dL   Glucose, Bld 104 (H) 65 - 99 mg/dL   Calcium, Ion 1.12 (L) 1.15 - 1.40 mmol/L   TCO2 22 22 - 32 mmol/L   Hemoglobin 13.3 13.0 - 17.0 g/dL   HCT 39.0 39.0 - 52.0 %  I-stat troponin, ED  Result Value Ref Range   Troponin i, poc 0.01 0.00 - 0.08 ng/mL   Comment 3           Mr Brain Wo Contrast  Result Date: 06/27/2017 CLINICAL DATA:  Focal neuro deficit, greater than 6 hours, stroke suspected. New onset left-sided facial droop and slurred speech. EXAM: MRI HEAD WITHOUT CONTRAST TECHNIQUE: Multiplanar, multiecho pulse sequences of the brain  and surrounding structures were obtained without intravenous contrast. COMPARISON:  CT head without contrast from the same day. MRI brain 03/02/2010 FINDINGS: Brain: The diffusion-weighted images demonstrate  no acute or subacute infarction. A remote lacunar infarct of the left pons is new since 2011, but not acute. Progressive periventricular and subcortical white matter changes bilaterally likely reflect the sequela of microvascular ischemia. Focal area of susceptibility at the left internal capsule is compatible with a remote hemorrhagic lacunar infarct. Dilated perivascular spaces and nonhemorrhagic lacunar infarcts are present in the basal ganglia bilaterally. White matter changes extend into the brainstem. The cerebellum is unremarkable. The internal auditory canals are within normal limits. The ventricles are proportionate to the degree of atrophy. No significant extra-axial fluid collection is present. Vascular: Flow is present in the major intracranial arteries. Skull and upper cervical spine: The skull base is within normal limits. The craniocervical junction is normal. Grade 1 anterolisthesis at C2-3 is chronic. Marrow signal is normal. Sinuses/Orbits: The paranasal sinuses and mastoid air cells are clear. The globes and orbits are within normal limits. IMPRESSION: 1. No acute intracranial abnormality. 2. Progressive diffuse white matter changes since 2,011, this likely reflects the sequela of chronic microvascular ischemia. 3. Interval lacunar infarcts involving the right centrum semi ovale, left Electronically Signed   By: San Morelle M.D.   On: 06/27/2017 11:24   Ct Head Code Stroke Wo Contrast  Result Date: 06/27/2017 CLINICAL DATA:  Code stroke. New onset left-sided facial droop and slurred speech. EXAM: CT HEAD WITHOUT CONTRAST TECHNIQUE: Contiguous axial images were obtained from the base of the skull through the vertex without intravenous contrast. COMPARISON:  MRI brain 03/02/2010  FINDINGS: Brain: No acute infarct, hemorrhage, or mass lesion is present. Scattered bilateral subcortical white matter hypoattenuation demonstrates some progression since prior exam. The ventricles are of normal size. Vascular: Vascular calcifications are present within the cavernous internal carotid arteries. There is no hyperdense vessel. Skull: Calvarium is intact. No focal lytic or blastic lesions are present. Sinuses/Orbits: The paranasal sinuses and mastoid air cells are clear. Other: ASPECTS ([place] Stroke Program Early CT Score) - Ganglionic level infarction (caudate, lentiform nuclei, internal capsule, insula, M1-M3 cortex): 7/7 - Supraganglionic infarction (M4-M6 cortex): 3/3 Total score (0-10 with 10 being normal): 10/10 IMPRESSION: 1. Slight progression of white matter disease. 2. No acute intracranial abnormality. 3. ASPECTS is 10/10 The above was relayed via text pager to Dr. Leonel Ramsay on 06/27/2017 at 09:48 . Electronically Signed   By: San Morelle M.D.   On: 06/27/2017 09:49    EKG  EKG Interpretation  Date/Time:  Sunday June 27 2017 09:46:29 EST Ventricular Rate:  56 PR Interval:    QRS Duration: 105 QT Interval:  451 QTC Calculation: 436 R Axis:   49 Text Interpretation:  Sinus rhythm Left ventricular hypertrophy Abnormal T, consider ischemia, diffuse leads Anterior ST elevation, probably due to LVH Confirmed by Orlie Dakin [id number]) on 06/27/2017 10:27:07 AM     Lateral T wave changes new since March 02, 2010, likely strain pattern  Radiology No results found.  Procedures Procedures (including critical care time)  Medications Ordered in ED Medications - No data to display  Results for orders placed or performed during the hospital encounter of 06/27/17  Ethanol  Result Value Ref Range   Alcohol, Ethyl (B) <10 <10 mg/dL  Protime-INR  Result Value Ref Range   Prothrombin Time 13.3 11.4 - 15.2 seconds   INR 1.02   APTT  Result Value Ref Range    aPTT 39 (H) 24 - 36 seconds  CBC  Result Value Ref Range   WBC 7.2 4.0 - 10.5 K/uL   RBC 4.38  4.22 - 5.81 MIL/uL   Hemoglobin 13.7 13.0 - 17.0 g/dL   HCT 39.4 39.0 - 52.0 %   MCV 90.0 78.0 - 100.0 fL   MCH 31.3 26.0 - 34.0 pg   MCHC 34.8 30.0 - 36.0 g/dL   RDW 13.4 11.5 - 15.5 %   Platelets 191 150 - 400 K/uL  Differential  Result Value Ref Range   Neutrophils Relative % 57 %   Neutro Abs 4.1 1.7 - 7.7 K/uL   Lymphocytes Relative 31 %   Lymphs Abs 2.3 0.7 - 4.0 K/uL   Monocytes Relative 8 %   Monocytes Absolute 0.6 0.1 - 1.0 K/uL   Eosinophils Relative 3 %   Eosinophils Absolute 0.2 0.0 - 0.7 K/uL   Basophils Relative 1 %   Basophils Absolute 0.0 0.0 - 0.1 K/uL  Comprehensive metabolic panel  Result Value Ref Range   Sodium 136 135 - 145 mmol/L   Potassium 3.7 3.5 - 5.1 mmol/L   Chloride 106 101 - 111 mmol/L   CO2 20 (L) 22 - 32 mmol/L   Glucose, Bld 107 (H) 65 - 99 mg/dL   BUN 22 (H) 6 - 20 mg/dL   Creatinine, Ser 1.59 (H) 0.61 - 1.24 mg/dL   Calcium 9.0 8.9 - 10.3 mg/dL   Total Protein 6.7 6.5 - 8.1 g/dL   Albumin 3.8 3.5 - 5.0 g/dL   AST 18 15 - 41 U/L   ALT 10 (L) 17 - 63 U/L   Alkaline Phosphatase 71 38 - 126 U/L   Total Bilirubin 0.8 0.3 - 1.2 mg/dL   GFR calc non Af Amer 49 (L) >60 mL/min   GFR calc Af Amer 56 (L) >60 mL/min   Anion gap 10 5 - 15  Urine rapid drug screen (hosp performed)  Result Value Ref Range   Opiates NONE DETECTED NONE DETECTED   Cocaine POSITIVE (A) NONE DETECTED   Benzodiazepines NONE DETECTED NONE DETECTED   Amphetamines NONE DETECTED NONE DETECTED   Tetrahydrocannabinol NONE DETECTED NONE DETECTED   Barbiturates NONE DETECTED NONE DETECTED  Urinalysis, Routine w reflex microscopic  Result Value Ref Range   Color, Urine YELLOW YELLOW   APPearance CLEAR CLEAR   Specific Gravity, Urine 1.025 1.005 - 1.030   pH 5.0 5.0 - 8.0   Glucose, UA NEGATIVE NEGATIVE mg/dL   Hgb urine dipstick NEGATIVE NEGATIVE   Bilirubin Urine NEGATIVE  NEGATIVE   Ketones, ur NEGATIVE NEGATIVE mg/dL   Protein, ur 30 (A) NEGATIVE mg/dL   Nitrite NEGATIVE NEGATIVE   Leukocytes, UA NEGATIVE NEGATIVE   RBC / HPF 0-5 0 - 5 RBC/hpf   WBC, UA 6-30 0 - 5 WBC/hpf   Bacteria, UA RARE (A) NONE SEEN   Squamous Epithelial / LPF 0-5 (A) NONE SEEN  I-Stat Chem 8, ED  Result Value Ref Range   Sodium 139 135 - 145 mmol/L   Potassium 3.7 3.5 - 5.1 mmol/L   Chloride 105 101 - 111 mmol/L   BUN 25 (H) 6 - 20 mg/dL   Creatinine, Ser 1.60 (H) 0.61 - 1.24 mg/dL   Glucose, Bld 104 (H) 65 - 99 mg/dL   Calcium, Ion 1.12 (L) 1.15 - 1.40 mmol/L   TCO2 22 22 - 32 mmol/L   Hemoglobin 13.3 13.0 - 17.0 g/dL   HCT 39.0 39.0 - 52.0 %  I-stat troponin, ED  Result Value Ref Range   Troponin i, poc 0.01 0.00 - 0.08 ng/mL   Comment 3  Mr Brain Wo Contrast  Result Date: 06/27/2017 CLINICAL DATA:  Focal neuro deficit, greater than 6 hours, stroke suspected. New onset left-sided facial droop and slurred speech. EXAM: MRI HEAD WITHOUT CONTRAST TECHNIQUE: Multiplanar, multiecho pulse sequences of the brain and surrounding structures were obtained without intravenous contrast. COMPARISON:  CT head without contrast from the same day. MRI brain 03/02/2010 FINDINGS: Brain: The diffusion-weighted images demonstrate no acute or subacute infarction. A remote lacunar infarct of the left pons is new since 2011, but not acute. Progressive periventricular and subcortical white matter changes bilaterally likely reflect the sequela of microvascular ischemia. Focal area of susceptibility at the left internal capsule is compatible with a remote hemorrhagic lacunar infarct. Dilated perivascular spaces and nonhemorrhagic lacunar infarcts are present in the basal ganglia bilaterally. White matter changes extend into the brainstem. The cerebellum is unremarkable. The internal auditory canals are within normal limits. The ventricles are proportionate to the degree of atrophy. No significant  extra-axial fluid collection is present. Vascular: Flow is present in the major intracranial arteries. Skull and upper cervical spine: The skull base is within normal limits. The craniocervical junction is normal. Grade 1 anterolisthesis at C2-3 is chronic. Marrow signal is normal. Sinuses/Orbits: The paranasal sinuses and mastoid air cells are clear. The globes and orbits are within normal limits. IMPRESSION: 1. No acute intracranial abnormality. 2. Progressive diffuse white matter changes since 2,011, this likely reflects the sequela of chronic microvascular ischemia. 3. Interval lacunar infarcts involving the right centrum semi ovale, left Electronically Signed   By: San Morelle M.D.   On: 06/27/2017 11:24   Ct Head Code Stroke Wo Contrast  Result Date: 06/27/2017 CLINICAL DATA:  Code stroke. New onset left-sided facial droop and slurred speech. EXAM: CT HEAD WITHOUT CONTRAST TECHNIQUE: Contiguous axial images were obtained from the base of the skull through the vertex without intravenous contrast. COMPARISON:  MRI brain 03/02/2010 FINDINGS: Brain: No acute infarct, hemorrhage, or mass lesion is present. Scattered bilateral subcortical white matter hypoattenuation demonstrates some progression since prior exam. The ventricles are of normal size. Vascular: Vascular calcifications are present within the cavernous internal carotid arteries. There is no hyperdense vessel. Skull: Calvarium is intact. No focal lytic or blastic lesions are present. Sinuses/Orbits: The paranasal sinuses and mastoid air cells are clear. Other: ASPECTS ([place] Stroke Program Early CT Score) - Ganglionic level infarction (caudate, lentiform nuclei, internal capsule, insula, M1-M3 cortex): 7/7 - Supraganglionic infarction (M4-M6 cortex): 3/3 Total score (0-10 with 10 being normal): 10/10 IMPRESSION: 1. Slight progression of white matter disease. 2. No acute intracranial abnormality. 3. ASPECTS is 10/10 The above was relayed via  text pager to Dr. Leonel Ramsay on 06/27/2017 at 09:48 . Electronically Signed   By: San Morelle M.D.   On: 06/27/2017 09:49   Initial Impression / Assessment and Plan / ED Course  I have reviewed the triage vital signs and the nursing notes.  Pertinent labs & imaging results that were available during my care of the patient were reviewed by me and considered in my medical decision making (see chart for details).     Preliminary impression is that patient has a new left-sided peripheral 7th nerve palsy 12:10 PM patient is alert Glasgow Coma Score 15 exam unchanged.  Clonidine ordered, which is his usual morning blood pressure medication.  Dr. Leonel Ramsay evaluated patient in the emergency department, also feels exam consistent with peripheral 7th nerve palsy.  Suggest Valtrex 1000 mg 3 times daily for a week as well as prednisone  for a week.  We will discontinue meloxicam.  Artificial tears patient can follow-up with his primary care physician  Renal insufficiency is chronic  1:05 PM patient is alert and in no distress ambulates without difficulty. He will be given resource guide for substance abuse.  Follow-up with PMD Counseled for 5 minutes on smoking cessation.  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

## 2017-06-27 NOTE — Discharge Instructions (Signed)
Stop meloxicam.  Take the medications prescribed.  Contact your primary care physician tomorrow to get your blood pressure rechecked within the next 1 or 2 weeks.  Today's was elevated at

## 2017-06-27 NOTE — Consult Note (Signed)
Neurology Consultation Reason for Consult: Facial weakness Referring Physician: Lyn Hollingshead  CC: Facial weakness  History is obtained from: Patient  HPI: [name] is a 52 y.o. male last known well when he went to bed last night around 8 PM.  He had noticed at that time that he felt like he had an "ear infection" in his left ear.  This morning, he woke up at 730, morning went to smoke a cigarette and found that he was having difficulty holding in his mouth.  For this reason he called 911.  He has a history of a previous stroke causing left hemiparesis   LKW: 8 PM last night tpa given?: no, out of window  ROS: A 14 point ROS was performed and is negative except as noted in the HPI.   Past Medical History:  Diagnosis Date  . Asthma   . GERD (gastroesophageal reflux disease)   . Hypertension   . MI (myocardial infarction) ([place])   . Stroke [facility])      Family history: No history of similar   Social History:  reports that he has been smoking cigarettes.  He has been smoking about 0.50 packs per day. He uses smokeless tobacco. He reports that he does not drink alcohol or use drugs.   Exam: Current vital signs: BP (!) 140/108   Pulse 63   Temp 98 F (36.7 C) (Oral)   Resp 16   Ht 6\' 2"  (1.88 m)   Wt 83.9 kg (185 lb)   SpO2 100%   BMI 23.75 kg/m  Vital signs in last 24 hours: Temp:  [98 F (36.7 C)] 98 F (36.7 C) (01/13 0946) Pulse Rate:  [49-63] 63 (01/13 1409) Resp:  [10-16] 16 (01/13 1015) BP: (140-196)/(108-129) 140/108 (01/13 1408) SpO2:  [95 %-100 %] 100 % (01/13 1409) Weight:  [83.9 kg (185 lb)] 83.9 kg (185 lb) (01/13 0948)   Physical Exam  Constitutional: Appears well-developed and well-nourished.  Psych: Affect appropriate to situation Eyes: No scleral injection HENT: No erythema of the tympanic membrane on the left.  Copious earwax Head: Normocephalic.  Cardiovascular: Normal rate and regular rhythm.  Respiratory: Effort normal, non-labored  breathing GI: Soft.  No distension. There is no tenderness.  Skin: WDI  Neuro: Mental Status: Patient is awake, alert, oriented to person, place, month, year, and situation. Patient is able to give a clear and coherent history. No signs of aphasia or neglect Cranial Nerves: II: Visual Fields are full. Pupils are equal, round, and reactive to light.   III,IV, VI: EOMI without ptosis or diploplia.  V: Facial sensation is diminished on the left VII: Facial movement with left facial weakness with forehead involvement VIII: hearing is intact to voice X: Uvula elevates symmetrically XI: Shoulder shrug is symmetric. XII: tongue is midline without atrophy or fasciculations.  Motor: Tone is normal. Bulk is normal. 5/5 strength was present  On the right side, on the left side he has 4/5 weakness of his left arm and leg sensory: Sensation is diminished throughout the left side Cerebellar: He has ataxia out of proportion to his weakness in his left arm and leg.  I have reviewed labs in epic and the results pertinent to this consultation are: Mildly elevated creatinine  I have reviewed the images obtained: CT head-unremarkable  Impression: 52 year old male with left facial weakness as well as left sided weakness and numbness.  I am not sure how much of his deficits are old versus new and therefore I  am not confident that this is truly an isolated 7th nerve palsy.  Certainly the left ear pain and left peripheral appearing seventh is suggestive of a Bell's palsy, but with the other factors at play I would favor getting an MRI of his brain.  Recommendations: 1) MRI brain 2) if MRI is negative, I would do prednisone 60 mg daily for a week as well as Valtrex   Roland Rack, MD [facility] [phone]  If 7pm- 7am, please page neurology on call as listed in [place].

## 2017-06-27 NOTE — ED Triage Notes (Signed)
Patient brought in by [facility] for complaint of left sided facial droop. Patient last known well at 2000 yesterday, noticed symptoms this morning at 0730 when he tried to smoke a cigarette but had difficulty holding the cigarette in his mouth. Residual left sided weakness from previous stroke. Patient alert and oriented and in no apparent distress at this time. 20g saline lock in left AC. Hypertensive with pressure 180/120, patient states he did not have time to take his medications this morning. Patient states he ate breakfast while waiting for ambulance after calling 911.

## 2017-11-09 ENCOUNTER — Telehealth: Payer: Self-pay | Admitting: *Deleted

## 2017-11-09 ENCOUNTER — Ambulatory Visit: Payer: Medicare Other | Admitting: Neurology

## 2017-11-09 NOTE — Telephone Encounter (Signed)
No showed new patient appointment. 

## 2017-11-10 ENCOUNTER — Encounter: Payer: Self-pay | Admitting: Neurology

## 2018-10-18 ENCOUNTER — Telehealth: Payer: Self-pay

## 2018-10-18 NOTE — Telephone Encounter (Signed)
I contacted the pt to see if he would be interested in moving his appt up to a virtual video visit with Dr. Jannifer Franklin. Pt stated he did not have the capability and would like to wait for a face to face.

## 2018-11-14 ENCOUNTER — Telehealth: Payer: Self-pay | Admitting: Neurology

## 2018-11-14 NOTE — Telephone Encounter (Signed)
Due to current COVID 19 pandemic, our office is severely reducing in office visits until further notice, in order to minimize the risk to our patients and healthcare providers.   Called patient to offer a virtual visit for his 6/8 appointment. Patient declined as he would rather come in office. Patient verbalized understanding of the precautions we are taking for in office visits. He understands that he will need to wear a mask and will have his temp checked upon arrival.

## 2018-11-21 ENCOUNTER — Telehealth: Payer: Self-pay | Admitting: Neurology

## 2018-11-21 ENCOUNTER — Ambulatory Visit: Payer: Medicare Other | Admitting: Neurology

## 2018-11-21 NOTE — Telephone Encounter (Signed)
This patient did not show for new patient appointment today.  This is the second new patient no-show, however the first no-show was just over 1 year ago.

## 2019-03-29 ENCOUNTER — Encounter: Payer: Self-pay | Admitting: Gastroenterology

## 2019-04-19 ENCOUNTER — Other Ambulatory Visit: Payer: Self-pay

## 2019-04-19 ENCOUNTER — Ambulatory Visit (AMBULATORY_SURGERY_CENTER): Payer: 59 | Admitting: *Deleted

## 2019-04-19 VITALS — Temp 96.9°F | Ht 74.0 in | Wt 219.0 lb

## 2019-04-19 DIAGNOSIS — Z1159 Encounter for screening for other viral diseases: Secondary | ICD-10-CM

## 2019-04-19 DIAGNOSIS — Z1211 Encounter for screening for malignant neoplasm of colon: Secondary | ICD-10-CM

## 2019-04-19 NOTE — Progress Notes (Signed)
No egg or soy allergy known to patient  No issues with past sedation with any surgeries  or procedures, no intubation problems  No diet pills per patient No home 02 use per patient  No blood thinners per patient  Pt denies issues with constipation  No A fib or A flutter  EMMI video sent to pt's e mail   Due to the COVID-19 pandemic we are asking patients to follow these guidelines. Please only bring one care partner. Please be aware that your care partner may wait in the car in the parking lot or if they feel like they will be too hot to wait in the car, they may wait in the lobby on the 4th floor. All care partners are required to wear a mask the entire time (we do not have any that we can provide them), they need to practice social distancing, and we will do a Covid check for all patient's and care partners when you arrive. Also we will check their temperature and your temperature. If the care partner waits in their car they need to stay in the parking lot the entire time and we will call them on their cell phone when the patient is ready for discharge so they can bring the car to the front of the building. Also all patient's will need to wear a mask into building. covid test 11-12 at 1020 am

## 2019-04-27 ENCOUNTER — Ambulatory Visit (INDEPENDENT_AMBULATORY_CARE_PROVIDER_SITE_OTHER): Payer: 59

## 2019-04-27 ENCOUNTER — Other Ambulatory Visit: Payer: Self-pay | Admitting: Gastroenterology

## 2019-04-27 DIAGNOSIS — Z1159 Encounter for screening for other viral diseases: Secondary | ICD-10-CM

## 2019-04-28 LAB — SARS CORONAVIRUS 2 (TAT 6-24 HRS): SARS Coronavirus 2: NEGATIVE

## 2019-05-02 ENCOUNTER — Other Ambulatory Visit: Payer: Self-pay

## 2019-05-02 ENCOUNTER — Encounter: Payer: Self-pay | Admitting: Gastroenterology

## 2019-05-02 ENCOUNTER — Ambulatory Visit (AMBULATORY_SURGERY_CENTER): Payer: 59 | Admitting: Gastroenterology

## 2019-05-02 VITALS — BP 155/106 | HR 51 | Temp 97.6°F | Resp 11 | Ht 74.0 in | Wt 219.0 lb

## 2019-05-02 DIAGNOSIS — Z1211 Encounter for screening for malignant neoplasm of colon: Secondary | ICD-10-CM

## 2019-05-02 DIAGNOSIS — K641 Second degree hemorrhoids: Secondary | ICD-10-CM | POA: Diagnosis not present

## 2019-05-02 DIAGNOSIS — D122 Benign neoplasm of ascending colon: Secondary | ICD-10-CM | POA: Diagnosis not present

## 2019-05-02 DIAGNOSIS — R195 Other fecal abnormalities: Secondary | ICD-10-CM | POA: Diagnosis not present

## 2019-05-02 MED ORDER — SODIUM CHLORIDE 0.9 % IV SOLN: 500.0000 mL | Freq: Once | INTRAVENOUS | Status: DC

## 2019-05-02 NOTE — Patient Instructions (Signed)
Thank you for allowing [place] to care for you today!  Await pathology results, approximately 1 -2 weeks.  Recommendation will be made at that time for next colonoscopy.  Resume previous diet and medications today.  Recommend adopting a high fiber diet and taking FiberCon one table daily by mouth.  If hemorrhoids are bothersome, may use Preparation H oinment or cream 1-2 times daily as needed.  May also use a sitz bath at night as needed.  Resume normal activities tomorrow.    YOU HAD AN ENDOSCOPIC PROCEDURE TODAY AT [facility]:   Refer to the procedure report that was given to you for any specific questions about what was found during the examination.  If the procedure report does not answer your questions, please call your gastroenterologist to clarify.  If you requested that your care partner not be given the details of your procedure findings, then the procedure report has been included in a sealed envelope for you to review at your convenience later.  YOU SHOULD EXPECT: Some feelings of bloating in the abdomen. Passage of more gas than usual.  Walking can help get rid of the air that was put into your GI tract during the procedure and reduce the bloating. If you had a lower endoscopy (such as a colonoscopy or flexible sigmoidoscopy) you may notice spotting of blood in your stool or on the toilet paper. If you underwent a bowel prep for your procedure, you may not have a normal bowel movement for a few days.  Please Note:  You might notice some irritation and congestion in your nose or some drainage.  This is from the oxygen used during your procedure.  There is no need for concern and it should clear up in a day or so.  SYMPTOMS TO REPORT IMMEDIATELY:   Following lower endoscopy (colonoscopy or flexible sigmoidoscopy):  Excessive amounts of blood in the stool  Significant tenderness or worsening of abdominal pains  Swelling of the abdomen that is new, acute  Fever of 100F  or higher   For urgent or emergent issues, a gastroenterologist can be reached at any hour by calling [phone].   DIET:  We do recommend a small meal at first, but then you may proceed to your regular diet.  Drink plenty of fluids but you should avoid alcoholic beverages for 24 hours.  ACTIVITY:  You should plan to take it easy for the rest of today and you should NOT DRIVE or use heavy machinery until tomorrow (because of the sedation medicines used during the test).    FOLLOW UP: Our staff will call the number listed on your records 48-72 hours following your procedure to check on you and address any questions or concerns that you may have regarding the information given to you following your procedure. If we do not reach you, we will leave a message.  We will attempt to reach you two times.  During this call, we will ask if you have developed any symptoms of COVID 19. If you develop any symptoms (ie: fever, flu-like symptoms, shortness of breath, cough etc.) before then, please call [phone].  If you test positive for Covid 19 in the 2 weeks post procedure, please call and report this information to [place].    If any biopsies were taken you will be contacted by phone or by letter within the next 1-3 weeks.  Please call us at [phone] if you have not heard about the biopsies in 3 weeks.  SIGNATURES/CONFIDENTIALITY: You and/or your care partner have signed paperwork which will be entered into your electronic medical record.  These signatures attest to the fact that that the information above on your After Visit Summary has been reviewed and is understood.  Full responsibility of the confidentiality of this discharge information lies with you and/or your care-partner.

## 2019-05-02 NOTE — Progress Notes (Signed)
Report given to PACU, vss 

## 2019-05-02 NOTE — Progress Notes (Signed)
Temp check by:JB Vital check by:CW  The patient states no changes in medical or surgical history since pre-visit screening on 04/19/2019.

## 2019-05-02 NOTE — Progress Notes (Signed)
Called to room to assist during endoscopic procedure.  Patient ID and intended procedure confirmed with present staff. Received instructions for my participation in the procedure from the performing physician.  

## 2019-05-02 NOTE — Op Note (Signed)
[facility] Patient Name: [name] Procedure Date: 05/02/2019 8:19 AM MRN: [id number] Endoscopist: Justice Britain , MD Age: 53 Referring MD:  Date of Birth: [birth year] Gender: Male Account #: [id number] Procedure:                Colonoscopy Indications:              Screening for colorectal malignant neoplasm -                            patient reports a positive stool test but not clear                            if just occult or Cologuard (we will try to find                            out what it was) Medicines:                Monitored Anesthesia Care Procedure:                Pre-Anesthesia Assessment:                           - Prior to the procedure, a History and Physical                            was performed, and patient medications and                            allergies were reviewed. The patient's tolerance of                            previous anesthesia was also reviewed. The risks                            and benefits of the procedure and the sedation                            options and risks were discussed with the patient.                            All questions were answered, and informed consent                            was obtained. Prior Anticoagulants: The patient has                            taken no previous anticoagulant or antiplatelet                            agents except for aspirin. ASA Grade Assessment: II                            - A patient with mild systemic disease. After  reviewing the risks and benefits, the patient was                            deemed in satisfactory condition to undergo the                            procedure.                           After obtaining informed consent, the colonoscope                            was passed under direct vision. Throughout the                            procedure, the patient's blood pressure, pulse, and    oxygen saturations were monitored continuously. The                            Colonoscope was introduced through the anus and                            advanced to the 5 cm into the ileum. The                            colonoscopy was performed without difficulty. The                            patient tolerated the procedure. The quality of the                            bowel preparation was good. The terminal ileum,                            ileocecal valve, appendiceal orifice, and rectum                            were photographed. Scope In: 8:29:45 AM Scope Out: 8:48:10 AM Scope Withdrawal Time: 0 hours 15 minutes 8 seconds  Total Procedure Duration: 0 hours 18 minutes 25 seconds  Findings:                 The digital rectal exam findings include                            hemorrhoids. Pertinent negatives include no                            palpable rectal lesions.                           The terminal ileum and ileocecal valve appeared                            normal.  A 4 mm polyp was found in the ascending colon. The                            polyp was semi-sessile. The polyp was removed with                            a cold snare. Resection and retrieval were complete.                           Normal mucosa was found in the entire colon                            otherwise.                           Non-bleeding non-thrombosed external and internal                            hemorrhoids were found during retroflexion, during                            perianal exam and during digital exam. The                            hemorrhoids were Grade II (internal hemorrhoids                            that prolapse but reduce spontaneously). Complications:            No immediate complications. Estimated Blood Loss:     Estimated blood loss was minimal. Impression:               - Hemorrhoids found on digital rectal exam.                            - The examined portion of the ileum was normal.                           - One 4 mm polyp in the ascending colon, removed                            with a cold snare. Resected and retrieved.                           - Normal mucosa in the entire examined colon                            otherwise.                           - Non-bleeding non-thrombosed external and internal                            hemorrhoids. Recommendation:           - The patient will be observed  post-procedure,                            until all discharge criteria are met.                           - Discharge patient to home.                           - Patient has a contact number available for                            emergencies. The signs and symptoms of potential                            delayed complications were discussed with the                            patient. Return to normal activities tomorrow.                            Written discharge instructions were provided to the                            patient.                           - High fiber diet.                           - Use FiberCon 1 tablet PO daily.                           - Recommend Preparation H ointment vs cream 1-2x                            daily.                           - Sitz bath recommendations to be given for patient                            to do at night as needed.                           - Continue present medications.                           - Await pathology results.                           - Repeat colonoscopy in 7 years for surveillance                            based on pathology results.                           -  We are going to reach out to referring provider's                            office to find out if the patient had a positive                            Cologuard test vs a positive FOBT. If the test was                            a positive Cologuard, I would recommend that we                             obtain his most recent CBC within the last 2-3                            months and if we do not have one then we should                            obtain and ensure that he does not have Iron                            deficiency. If he has Iron deficiency, he would                            warrant and Upper Endoscopy.                           - The findings and recommendations were discussed                            with the patient. Justice Britain, MD 05/02/2019 8:58:02 AM

## 2019-05-04 ENCOUNTER — Telehealth: Payer: Self-pay

## 2019-05-04 ENCOUNTER — Encounter: Payer: Self-pay | Admitting: Gastroenterology

## 2019-05-04 NOTE — Telephone Encounter (Signed)
  Follow up Call-  Call back number 05/02/2019  Post procedure Call Back phone  # [phone]  Permission to leave phone message Yes  Some recent data might be hidden     Patient questions:  Do you have a fever, pain , or abdominal swelling? No. Pain Score  0 *  Have you tolerated food without any problems? Yes.    Have you been able to return to your normal activities? Yes.    Do you have any questions about your discharge instructions: Diet   No. Medications  No. Follow up visit  No.  Do you have questions or concerns about your Care? No  Actions: * If pain score is 4 or above: No action needed, pain <4.  1. Have you developed a fever since your procedure? No  2.   Have you had an respiratory symptoms (SOB or cough) since your procedure? No  3.   Have you tested positive for COVID 19 since your procedure No  4.   Have you had any family members/close contacts diagnosed with the COVID 19 since your procedure?  No   If yes to any of these questions please route to Joylene John, RN and Alphonsa Gin, RN.

## 2019-05-08 ENCOUNTER — Telehealth: Payer: Self-pay

## 2019-05-08 NOTE — Telephone Encounter (Signed)
Call PCP and find out if pt had a positive cologuard or positive FOBT.  If the test was positive cologuard Dr Rush Landmark recommends we obtain the most recent CBC within the last 2-3 months.  If one has not been completed need to do that now.  See 11/17 EGD note

## 2019-05-08 NOTE — Telephone Encounter (Signed)
The pt has not had a cologuard.  He had a positive FOBT. That result will be faxed to our office for review.

## 2019-05-08 NOTE — Telephone Encounter (Signed)
Thank you for update. GM 

## 2020-07-16 ENCOUNTER — Other Ambulatory Visit: Payer: Self-pay | Admitting: Internal Medicine

## 2020-07-16 ENCOUNTER — Ambulatory Visit: Payer: 59 | Attending: Internal Medicine

## 2020-07-16 DIAGNOSIS — Z23 Encounter for immunization: Secondary | ICD-10-CM

## 2020-07-16 NOTE — Progress Notes (Signed)
   Covid-19 Vaccination Clinic  Name:  [name]    MRN: [id number] DOB: 1965-09-01  07/16/2020  [name] was observed post Covid-19 immunization for 15 minutes without incident. He was provided with Vaccine Information Sheet and instruction to access the V-Safe system.   [name] was instructed to call 911 with any severe reactions post vaccine: Marland Kitchen Difficulty breathing  . Swelling of face and throat  . A fast heartbeat  . A bad rash all over body  . Dizziness and weakness   Immunizations Administered    Name Date Dose VIS Date Route   Moderna Covid-19 Booster Vaccine 07/16/2020 11:46 AM 0.25 mL 04/03/2020 Intramuscular   Manufacturer: Moderna   Lot: [id number]   [place]: [id number]

## 2021-03-11 ENCOUNTER — Other Ambulatory Visit: Payer: Self-pay

## 2021-03-11 DIAGNOSIS — F319 Bipolar disorder, unspecified: Secondary | ICD-10-CM | POA: Insufficient documentation

## 2021-03-11 DIAGNOSIS — B1920 Unspecified viral hepatitis C without hepatic coma: Secondary | ICD-10-CM | POA: Insufficient documentation

## 2021-03-11 DIAGNOSIS — I639 Cerebral infarction, unspecified: Secondary | ICD-10-CM | POA: Insufficient documentation

## 2021-03-11 DIAGNOSIS — Z955 Presence of coronary angioplasty implant and graft: Secondary | ICD-10-CM | POA: Insufficient documentation

## 2021-03-11 DIAGNOSIS — I2510 Atherosclerotic heart disease of native coronary artery without angina pectoris: Secondary | ICD-10-CM | POA: Insufficient documentation

## 2021-03-11 DIAGNOSIS — J45909 Unspecified asthma, uncomplicated: Secondary | ICD-10-CM | POA: Insufficient documentation

## 2021-03-11 DIAGNOSIS — R001 Bradycardia, unspecified: Secondary | ICD-10-CM | POA: Insufficient documentation

## 2021-03-11 DIAGNOSIS — E7800 Pure hypercholesterolemia, unspecified: Secondary | ICD-10-CM | POA: Insufficient documentation

## 2021-03-11 DIAGNOSIS — I252 Old myocardial infarction: Secondary | ICD-10-CM | POA: Insufficient documentation

## 2021-03-11 DIAGNOSIS — I219 Acute myocardial infarction, unspecified: Secondary | ICD-10-CM | POA: Insufficient documentation

## 2021-03-11 DIAGNOSIS — Z8673 Personal history of transient ischemic attack (TIA), and cerebral infarction without residual deficits: Secondary | ICD-10-CM

## 2021-03-11 DIAGNOSIS — I10 Essential (primary) hypertension: Secondary | ICD-10-CM

## 2021-03-11 DIAGNOSIS — F419 Anxiety disorder, unspecified: Secondary | ICD-10-CM | POA: Insufficient documentation

## 2021-03-11 DIAGNOSIS — R519 Headache, unspecified: Secondary | ICD-10-CM | POA: Insufficient documentation

## 2021-03-11 DIAGNOSIS — K219 Gastro-esophageal reflux disease without esophagitis: Secondary | ICD-10-CM | POA: Insufficient documentation

## 2021-03-11 DIAGNOSIS — J309 Allergic rhinitis, unspecified: Secondary | ICD-10-CM | POA: Insufficient documentation

## 2021-03-11 HISTORY — DX: Essential (primary) hypertension: I10

## 2021-03-11 HISTORY — DX: Personal history of transient ischemic attack (TIA), and cerebral infarction without residual deficits: Z86.73

## 2021-03-19 ENCOUNTER — Encounter: Payer: Self-pay | Admitting: Cardiology

## 2021-03-19 ENCOUNTER — Other Ambulatory Visit: Payer: Self-pay

## 2021-03-19 ENCOUNTER — Ambulatory Visit (INDEPENDENT_AMBULATORY_CARE_PROVIDER_SITE_OTHER): Payer: 59 | Admitting: Cardiology

## 2021-03-19 VITALS — BP 118/72 | HR 56 | Ht 74.0 in | Wt 219.0 lb

## 2021-03-19 DIAGNOSIS — R0609 Other forms of dyspnea: Secondary | ICD-10-CM

## 2021-03-19 DIAGNOSIS — I10 Essential (primary) hypertension: Secondary | ICD-10-CM

## 2021-03-19 DIAGNOSIS — Z955 Presence of coronary angioplasty implant and graft: Secondary | ICD-10-CM

## 2021-03-19 DIAGNOSIS — E7800 Pure hypercholesterolemia, unspecified: Secondary | ICD-10-CM

## 2021-03-19 DIAGNOSIS — F17210 Nicotine dependence, cigarettes, uncomplicated: Secondary | ICD-10-CM

## 2021-03-19 DIAGNOSIS — I2510 Atherosclerotic heart disease of native coronary artery without angina pectoris: Secondary | ICD-10-CM

## 2021-03-19 HISTORY — DX: Nicotine dependence, cigarettes, uncomplicated: F17.210

## 2021-03-19 HISTORY — DX: Other forms of dyspnea: R06.09

## 2021-03-19 MED ORDER — NITROGLYCERIN 0.4 MG SL SUBL: 0.4000 mg | SUBLINGUAL_TABLET | SUBLINGUAL | 3 refills | Status: DC | PRN

## 2021-03-19 NOTE — Progress Notes (Signed)
Cardiology Office Note:    Date:  03/19/2021   ID:  [name], DOB 1966-04-05, MRN [id number]  PCP:  Cyndi Bender, PA-C  Cardiologist:  Jenean Lindau, MD   Referring MD: Sherlyn Lees, FNP    ASSESSMENT:    1. Hypercholesterolemia   2. Primary hypertension   3. Coronary artery disease involving native coronary artery of native heart without angina pectoris   4. History of heart artery stent   5. Dyspnea on exertion   6. Cigarette smoker    PLAN:    In order of problems listed above:  Coronary artery disease: Secondary prevention stressed with the patient.  Importance of compliance with diet medication stressed and vocalized understanding. Dyspnea on exertion: Unclear etiology.  Sublingual nitroglycerin prescription was sent, its protocol and 911 protocol explained and the patient vocalized understanding questions were answered to the patient's satisfaction.  This may be an anginal equivalent so we will do an exercise stress Cardiolite.  He has undergone coronary stenting in 2003. Cardiac murmur: Echocardiogram will be done to assess murmur heard on auscultation. Essential hypertension: Blood pressure stable and diet was emphasized.  Lifestyle modification urged. Mixed dyslipidemia: Lipids followed by primary care physician.  Diet emphasized. Cigarette smoker: I spent 5 minutes with the patient discussing solely about smoking. Smoking cessation was counseled. I suggested to the patient also different medications and pharmacological interventions. Patient is keen to try stopping on its own at this time. He will get back to me if he needs any further assistance in this matter. Patient will be seen in follow-up appointment in 6 months or earlier if the patient has any concerns    Medication Adjustments/Labs and Tests Ordered: Current medicines are reviewed at length with the patient today.  Concerns regarding medicines are outlined above.  Orders Placed This Encounter   Procedures   MYOCARDIAL PERFUSION IMAGING   EKG 12-Lead   ECHOCARDIOGRAM COMPLETE    Meds ordered this encounter  Medications   nitroGLYCERIN (NITROSTAT) 0.4 MG SL tablet    Sig: Place 1 tablet (0.4 mg total) under the tongue every 5 (five) minutes as needed.    Dispense:  30 tablet    Refill:  3      History of Present Illness:    [name] is a 55 y.o. male who is being seen today for the evaluation of coronary artery disease and erectile dysfunction at the request of Sherlyn Lees, [place].  Patient is a pleasant 55 year old male.  He has past medical history of coronary artery disease post stenting, history of stroke, essential hypertension and dyslipidemia.  He mentions to me that he leads a sedentary lifestyle.  He is on disability.  Patient mentions to me that he has erectile dysfunction and therefore needed medications and his primary care sent him for cardiac evaluation in view of the aforementioned history.  He denies any chest pain orthopnea or PND.  He gives history of some shortness of breath on exertion.  At the time of my evaluation, the patient is alert awake oriented and in no distress.  Unfortunately he is an active smoker and smokes since young age.  Past Medical History:  Diagnosis Date   Allergic rhinitis    Anxiety    Asthma    Bipolar disorder (HCC)    Bradycardia    CAD (coronary artery disease)    CVA (cerebral vascular accident) ([place])    GERD (gastroesophageal reflux disease)    Headache    Hepatitis  C    History of heart artery stent    History of myocardial infarction    History of stroke    Hypercholesterolemia    Hypertension    MI (myocardial infarction) ([place])    Stroke [facility])     Past Surgical History:  Procedure Laterality Date   CORONARY STENT PLACEMENT     x2 stents     Current Medications: Current Meds  Medication Sig   amLODipine (NORVASC) 10 MG tablet Take 10 mg by mouth daily.   aspirin 81 MG EC tablet Chew 81 mg by mouth  daily.   busPIRone (BUSPAR) 15 MG tablet Take 7.5 mg by mouth 2 (two) times daily.   cloNIDine (CATAPRES) 0.2 MG tablet Take 0.2 mg by mouth 2 (two) times daily.   finasteride (PROSCAR) 5 MG tablet Take 5 mg by mouth daily.   gabapentin (NEURONTIN) 100 MG capsule Take 100 mg by mouth 3 (three) times daily as needed (PAIN).   hydrochlorothiazide (HYDRODIURIL) 25 MG tablet Take 25 mg by mouth every morning. for blood pressure   meloxicam (MOBIC) 7.5 MG tablet Take 7.5 mg by mouth daily.   nitroGLYCERIN (NITROSTAT) 0.4 MG SL tablet Place 1 tablet (0.4 mg total) under the tongue every 5 (five) minutes as needed.   rosuvastatin (CRESTOR) 20 MG tablet Take 20 mg by mouth daily.   tamsulosin (FLOMAX) 0.4 MG CAPS capsule Take 0.4 mg by mouth daily.   tiZANidine (ZANAFLEX) 4 MG tablet Take 4 mg by mouth 3 (three) times daily as needed for muscle spasms.   traZODone (DESYREL) 100 MG tablet Take 100 mg by mouth at bedtime.     Allergies:   Ace inhibitors and Penicillins   Social History   Socioeconomic History   Marital status: Single    Spouse name: Not on file   Number of children: Not on file   Years of education: Not on file   Highest education level: Not on file  Occupational History   Not on file  Tobacco Use   Smoking status: Every Day    Packs/day: 0.50    Types: Cigarettes   Smokeless tobacco: Never  Substance and Sexual Activity   Alcohol use: No   Drug use: No    Comment: last use cocaine "a couple months ago"   Sexual activity: Not on file  Other Topics Concern   Not on file  Social History Narrative   Not on file   Social Determinants of Health   Financial Resource Strain: Not on file  Food Insecurity: Not on file  Transportation Needs: Not on file  Physical Activity: Not on file  Stress: Not on file  Social Connections: Not on file     Family History: The patient's family history includes Arthritis in his maternal uncle; Asthma in his maternal aunt; Colon polyps  in his mother; Emphysema in his maternal aunt; Hearing loss in his maternal grandmother; Heart disease in his maternal grandmother; Skin cancer in his maternal grandmother; Stroke in his father. There is no history of Colon cancer, Esophageal cancer, Rectal cancer, or Stomach cancer.  ROS:   Please see the history of present illness.    All other systems reviewed and are negative.  EKGs/Labs/Other Studies Reviewed:    The following studies were reviewed today: EKG reveals sinus rhythm and nonspecific ST-T changes   Recent Labs: No results found for requested labs within last 8760 hours.  Recent Lipid Panel    Component Value Date/Time   CHOL  03/03/2010 0721    157        ATP III CLASSIFICATION:  <200     mg/dL   Desirable  200-239  mg/dL   Borderline High  >=240    mg/dL   High          TRIG 103 03/03/2010 0721   HDL 47 03/03/2010 0721   CHOLHDL 3.3 03/03/2010 0721   VLDL 21 03/03/2010 0721   LDLCALC  03/03/2010 0721    89        Total Cholesterol/HDL:CHD Risk Coronary Heart Disease Risk Table                     Men   Women  1/2 Average Risk   3.4   3.3  Average Risk       5.0   4.4  2 X Average Risk   9.6   7.1  3 X Average Risk  23.4   11.0        Use the calculated Patient Ratio above and the CHD Risk Table to determine the patient's CHD Risk.        ATP III CLASSIFICATION (LDL):  <100     mg/dL   Optimal  100-129  mg/dL   Near or Above                    Optimal  130-159  mg/dL   Borderline  160-189  mg/dL   High  >190     mg/dL   Very High   LDLDIRECT 96.0 11/15/2006 0838    Physical Exam:    VS:  BP 118/72 (BP Location: Left Arm, Patient Position: Sitting)   Pulse (!) 56   Ht 6\' 2"  (1.88 m)   Wt 219 lb (99.3 kg)   SpO2 98%   BMI 28.12 kg/m     Wt Readings from Last 3 Encounters:  03/19/21 219 lb (99.3 kg)  05/02/19 219 lb (99.3 kg)  04/19/19 219 lb (99.3 kg)     GEN: Patient is in no acute distress HEENT: Normal NECK: No JVD; No carotid  bruits LYMPHATICS: No lymphadenopathy CARDIAC: S1 S2 regular, 2/6 systolic murmur at the apex. RESPIRATORY:  Clear to auscultation without rales, wheezing or rhonchi  ABDOMEN: Soft, non-tender, non-distended MUSCULOSKELETAL:  No edema; No deformity  SKIN: Warm and dry NEUROLOGIC:  Alert and oriented x 3 PSYCHIATRIC:  Normal affect    Signed, Jenean Lindau, MD  03/19/2021 9:23 AM    [facility]

## 2021-03-19 NOTE — Patient Instructions (Signed)
Medication Instructions:  Your physician has recommended you make the following change in your medication:  START: Nitroglycerin 0.4 mg take one tablet by mouth every 5 minutes up to three times as needed for chest pain.  *If you need a refill on your cardiac medications before your next appointment, please call your pharmacy*   Lab Work: None If you have labs (blood work) drawn today and your tests are completely normal, you will receive your results only by: [place] (if you have MyChart) OR A paper copy in the mail If you have any lab test that is abnormal or we need to change your treatment, we will call you to review the results.   Testing/Procedures: Your physician has requested that you have an echocardiogram. Echocardiography is a painless test that uses sound waves to create images of your heart. It provides your doctor with information about the size and shape of your heart and how well your heart's chambers and valves are working. This procedure takes approximately one hour. There are no restrictions for this procedure.    [facility] [address] [place], [place] [address] Phone:  [phone]    Please arrive 15 minutes prior to your appointment time for registration and insurance purposes.  The test will take approximately 3 to 4 hours to complete; you may bring reading material.  If someone comes with you to your appointment, they will need to remain in the main lobby due to limited space in the testing area. **If you are pregnant or breastfeeding, please notify the nuclear lab prior to your appointment**  How to prepare for your Myocardial Perfusion Test: Do not eat or drink 3 hours prior to your test, except you may have water. Do not consume products containing caffeine (regular or decaffeinated) 12 hours prior to your test. (ex: coffee, chocolate, sodas, tea). Do bring a list of your current medications with you.  If not listed  below, you may take your medications as normal. Do wear comfortable clothes (no dresses or overalls) and walking shoes, tennis shoes preferred (No heels or open toe shoes are allowed). Do NOT wear cologne, perfume, aftershave, or lotions (deodorant is allowed). If these instructions are not followed, your test will have to be rescheduled.  Please report to [address] for your test.  If you have questions or concerns about your appointment, you can call the [facility] at [phone].  If you cannot keep your appointment, please provide 24 hours notification to the Nuclear Lab, to avoid a possible $50 charge to your account.    Follow-Up: At [facility], you and your health needs are our priority.  As part of our continuing mission to provide you with exceptional heart care, we have created designated Provider Care Teams.  These Care Teams include your primary Cardiologist (physician) and Advanced Practice Providers (APPs -  Physician Assistants and Nurse Practitioners) who all work together to provide you with the care you need, when you need it.  We recommend signing up for the patient portal called "MyChart".  Sign up information is provided on this After Visit Summary.  MyChart is used to connect with patients for Virtual Visits (Telemedicine).  Patients are able to view lab/test results, encounter notes, upcoming appointments, etc.  Non-urgent messages can be sent to your provider as well.   To learn more about what you can do with MyChart, go to [email or web address].    Your next appointment:  6 month(s)  The format for your next appointment:   In Person  Provider:   Jyl Heinz, MD   Other Instructions

## 2021-04-09 ENCOUNTER — Telehealth: Payer: Self-pay

## 2021-04-09 NOTE — Telephone Encounter (Signed)
Spoke with the patient, he stated that he didn't want to have the stress test. He stated that he would come for his ECHO. Stress cancelled out. S.Eulice Rutledge EMTP

## 2021-04-15 ENCOUNTER — Other Ambulatory Visit: Payer: 59

## 2022-09-17 DIAGNOSIS — I2510 Atherosclerotic heart disease of native coronary artery without angina pectoris: Secondary | ICD-10-CM | POA: Diagnosis not present

## 2022-09-17 DIAGNOSIS — I679 Cerebrovascular disease, unspecified: Secondary | ICD-10-CM | POA: Diagnosis not present

## 2022-09-17 DIAGNOSIS — R809 Proteinuria, unspecified: Secondary | ICD-10-CM | POA: Diagnosis not present

## 2022-09-17 DIAGNOSIS — I10 Essential (primary) hypertension: Secondary | ICD-10-CM | POA: Diagnosis not present

## 2022-09-17 DIAGNOSIS — Z87891 Personal history of nicotine dependence: Secondary | ICD-10-CM | POA: Diagnosis not present

## 2022-09-17 DIAGNOSIS — E1129 Type 2 diabetes mellitus with other diabetic kidney complication: Secondary | ICD-10-CM | POA: Diagnosis not present

## 2022-09-17 DIAGNOSIS — E7800 Pure hypercholesterolemia, unspecified: Secondary | ICD-10-CM | POA: Diagnosis not present

## 2022-09-17 DIAGNOSIS — M545 Low back pain, unspecified: Secondary | ICD-10-CM | POA: Diagnosis not present

## 2023-02-02 DIAGNOSIS — Z0000 Encounter for general adult medical examination without abnormal findings: Secondary | ICD-10-CM | POA: Diagnosis not present

## 2023-02-02 DIAGNOSIS — Z139 Encounter for screening, unspecified: Secondary | ICD-10-CM | POA: Diagnosis not present

## 2023-02-02 DIAGNOSIS — Z9181 History of falling: Secondary | ICD-10-CM | POA: Diagnosis not present

## 2023-04-24 DIAGNOSIS — I10 Essential (primary) hypertension: Secondary | ICD-10-CM | POA: Diagnosis not present

## 2023-04-24 DIAGNOSIS — F17210 Nicotine dependence, cigarettes, uncomplicated: Secondary | ICD-10-CM | POA: Diagnosis not present

## 2023-04-24 DIAGNOSIS — R059 Cough, unspecified: Secondary | ICD-10-CM | POA: Diagnosis not present

## 2023-04-24 DIAGNOSIS — R918 Other nonspecific abnormal finding of lung field: Secondary | ICD-10-CM | POA: Diagnosis not present

## 2023-04-24 DIAGNOSIS — B348 Other viral infections of unspecified site: Secondary | ICD-10-CM | POA: Diagnosis not present

## 2023-04-24 DIAGNOSIS — Z880 Allergy status to penicillin: Secondary | ICD-10-CM | POA: Diagnosis not present

## 2023-04-24 DIAGNOSIS — Z7982 Long term (current) use of aspirin: Secondary | ICD-10-CM | POA: Diagnosis not present

## 2023-04-24 DIAGNOSIS — J029 Acute pharyngitis, unspecified: Secondary | ICD-10-CM | POA: Diagnosis not present

## 2023-04-24 DIAGNOSIS — Z20822 Contact with and (suspected) exposure to covid-19: Secondary | ICD-10-CM | POA: Diagnosis not present

## 2023-04-24 DIAGNOSIS — Z5329 Procedure and treatment not carried out because of patient's decision for other reasons: Secondary | ICD-10-CM | POA: Diagnosis not present

## 2023-04-24 DIAGNOSIS — R52 Pain, unspecified: Secondary | ICD-10-CM | POA: Diagnosis not present

## 2023-04-24 DIAGNOSIS — Z91199 Patient's noncompliance with other medical treatment and regimen due to unspecified reason: Secondary | ICD-10-CM | POA: Diagnosis not present

## 2023-04-24 DIAGNOSIS — J984 Other disorders of lung: Secondary | ICD-10-CM | POA: Diagnosis not present

## 2023-05-10 DIAGNOSIS — R0602 Shortness of breath: Secondary | ICD-10-CM | POA: Diagnosis not present

## 2023-05-10 DIAGNOSIS — R059 Cough, unspecified: Secondary | ICD-10-CM | POA: Diagnosis not present

## 2023-05-10 DIAGNOSIS — Z20822 Contact with and (suspected) exposure to covid-19: Secondary | ICD-10-CM | POA: Diagnosis not present

## 2023-05-23 DIAGNOSIS — I252 Old myocardial infarction: Secondary | ICD-10-CM | POA: Diagnosis not present

## 2023-05-23 DIAGNOSIS — J90 Pleural effusion, not elsewhere classified: Secondary | ICD-10-CM | POA: Diagnosis not present

## 2023-05-23 DIAGNOSIS — R9431 Abnormal electrocardiogram [ECG] [EKG]: Secondary | ICD-10-CM | POA: Diagnosis not present

## 2023-05-23 DIAGNOSIS — I509 Heart failure, unspecified: Secondary | ICD-10-CM | POA: Diagnosis not present

## 2023-05-23 DIAGNOSIS — Z5329 Procedure and treatment not carried out because of patient's decision for other reasons: Secondary | ICD-10-CM | POA: Diagnosis not present

## 2023-05-23 DIAGNOSIS — Z79899 Other long term (current) drug therapy: Secondary | ICD-10-CM | POA: Diagnosis not present

## 2023-05-25 DIAGNOSIS — M7989 Other specified soft tissue disorders: Secondary | ICD-10-CM | POA: Diagnosis not present

## 2023-05-26 DIAGNOSIS — M7989 Other specified soft tissue disorders: Secondary | ICD-10-CM | POA: Insufficient documentation

## 2023-05-26 HISTORY — DX: Other specified soft tissue disorders: M79.89

## 2023-05-27 NOTE — Progress Notes (Signed)
Cardiology Office Note:  .   Date:  05/28/2023  ID:  [name], DOB July 30, 1965, MRN [id number] PCP: Lonie Peak, PA-C  [facility] Providers Cardiologist:  None    History of Present Illness: .   [name] is a 57 y.o. male with a past medical history of CAD with history of MI s/p DES in 2003, CVA, hypertension, GERD, hepatitis C, bipolar disorder, dyslipidemia, bradycardia, tobacco abuse.  2003 MI s/p DES--unknown number of stents or location, no results available for review  He established care with Dr. Tomie China on 03/19/2021, he had some DOE which could have been an anginal equivalent so the recommendation was for stress evaluation however it does not appear this was completed.  An echocardiogram was also recommended however this was not completed, he was advised to follow-up in 6 months however it appears he was lost to follow-up.  Since then, he has been evaluated twice in the emergency department within the last month for evaluation of shortness of breath--proBNP elevated greater than 26,000, it, given IV Lasix, left AMA and advised to follow-up with cardiology.  He presents today for follow-up of shortness of breath and pedal edema.  He states he has been feeling poorly over the last month.  He is very edematous, blood pressure is elevated. He denies chest pain, palpitations, dyspnea, pnd, orthopnea, n, v, dizziness, syncope, weight gain, or early satiety.    ROS: Review of Systems  Respiratory:  Positive for shortness of breath.   Cardiovascular:  Positive for leg swelling.  Neurological:  Positive for tingling.  All other systems reviewed and are negative.    Studies Reviewed: Marland Kitchen   EKG Interpretation Date/Time:  Friday May 28 2023 08:06:37 EST Ventricular Rate:  91 PR Interval:  164 QRS Duration:  94 QT Interval:  360 QTC Calculation: 442 R Axis:   61  Text Interpretation: Normal sinus rhythm Possible Left atrial enlargement Left ventricular  hypertrophy with repolarization abnormality Abnormal ECG When compared with ECG of 27-Jun-2017 09:46, PREVIOUS ECG IS PRESENT Confirmed by Wallis Bamberg [phone]) on 05/28/2023 8:14:04 AM       Risk Assessment/Calculations:     HYPERTENSION CONTROL Vitals:   05/28/23 0755 05/28/23 0901  BP: (!) 163/121 (!) 163/121    The patient's blood pressure is elevated above target today.  In order to address the patient's elevated BP: Labs and/or other diagnostics are currently pending prior to making blood pressure medication adjustments.          Physical Exam:   VS:  BP (!) 163/121   Pulse 91   Ht 6\' 2"  (1.88 m)   Wt 163 lb (73.9 kg)   SpO2 97%   BMI 20.93 kg/m    Wt Readings from Last 3 Encounters:  05/28/23 163 lb (73.9 kg)  03/19/21 219 lb (99.3 kg)  05/02/19 219 lb (99.3 kg)    GEN: Well nourished, well developed in no acute distress NECK: No JVD; No carotid bruits CARDIAC: RRR, no murmurs, rubs, gallops RESPIRATORY:  Clear to auscultation without rales, wheezing or rhonchi  ABDOMEN: Soft, non-tender, non-distended EXTREMITIES: +3 pitting edema to knees; No deformity   ASSESSMENT AND PLAN: .   CAD-s/p DES 2003--no records available for review, continue aspirin 81 mg daily, continue nitroglycerin as needed, continue Crestor 20 mg daily.  He has having some DOE this is likely related to his volume overload, we will arrange for an ischemic evaluation though as it does not appear there has been  one since his reported stent in 2003.  Beta-blocker previously been avoided secondary to cocaine abuse -- Will discuss this at next OV.  Pedal edema with recent pleural effusions-his legs are very edematous, NYHA class II-III -we have no echo on file available for review, Will arrange for an echocardiogram.  Creatinine was 1.20 and potassium 3.9 on 05/23/2023.  Start Lasix 40 mg once daily.  Repeat BMET and proBNP.  Hypertension-blood pressure is very elevated in the office today however is not  yet taken his antihypertensive medications and he was in a rush to get here today, continue Norvasc 10 mg daily, continue clonidine 0.2 mg twice daily, continue HCTZ 25 mg daily.  Repeating BMET and will make further recommendations as blood pressure at that time.  Dyslipidemia-most recent LDL on file for review is elevated at 91 2023, currently on Crestor 20 mg daily--will address this at next OV.  Tobacco abuse-will address at next OV.  Encounter for screening for DM  -mentions that he thinks he has diabetes, does not appear that has been evaluated for this, will check his A1c.    Informed Consent   Shared Decision Making/Informed Consent The risks [chest pain, shortness of breath, cardiac arrhythmias, dizziness, blood pressure fluctuations, myocardial infarction, stroke/transient ischemic attack, nausea, vomiting, allergic reaction, radiation exposure, metallic taste sensation and life-threatening complications (estimated to be 1 in 10,000)], benefits (risk stratification, diagnosing coronary artery disease, treatment guidance) and alternatives of a nuclear stress test were discussed in detail with Mr. Henault and he agrees to proceed.     Dispo: Echo, Lexiscan, BMET, proBNP, A1c, start Lasix 40 mg daily.  Return in 4 weeks  Signed, Flossie Dibble, NP

## 2023-05-28 ENCOUNTER — Encounter: Payer: Self-pay | Admitting: Cardiology

## 2023-05-28 ENCOUNTER — Ambulatory Visit: Payer: 59 | Attending: Cardiology | Admitting: Cardiology

## 2023-05-28 VITALS — BP 163/121 | HR 91 | Ht 74.0 in | Wt 163.0 lb

## 2023-05-28 DIAGNOSIS — I639 Cerebral infarction, unspecified: Secondary | ICD-10-CM

## 2023-05-28 DIAGNOSIS — Z131 Encounter for screening for diabetes mellitus: Secondary | ICD-10-CM | POA: Diagnosis not present

## 2023-05-28 DIAGNOSIS — B182 Chronic viral hepatitis C: Secondary | ICD-10-CM

## 2023-05-28 DIAGNOSIS — E785 Hyperlipidemia, unspecified: Secondary | ICD-10-CM | POA: Diagnosis not present

## 2023-05-28 DIAGNOSIS — I2510 Atherosclerotic heart disease of native coronary artery without angina pectoris: Secondary | ICD-10-CM | POA: Diagnosis not present

## 2023-05-28 DIAGNOSIS — F1410 Cocaine abuse, uncomplicated: Secondary | ICD-10-CM | POA: Insufficient documentation

## 2023-05-28 DIAGNOSIS — I10 Essential (primary) hypertension: Secondary | ICD-10-CM

## 2023-05-28 DIAGNOSIS — R0609 Other forms of dyspnea: Secondary | ICD-10-CM | POA: Diagnosis not present

## 2023-05-28 DIAGNOSIS — R600 Localized edema: Secondary | ICD-10-CM

## 2023-05-28 HISTORY — DX: Cocaine abuse, uncomplicated: F14.10

## 2023-05-28 MED ORDER — FUROSEMIDE 40 MG PO TABS: 40.0000 mg | ORAL_TABLET | Freq: Every day | ORAL | 5 refills | Status: DC

## 2023-05-28 NOTE — Patient Instructions (Signed)
Medication Instructions:  Your physician has recommended you make the following change in your medication:  Take Lasix 40 mg once daily; Take an extra tablet if weight gain of 3 pds in 1 day Weigh daily  *If you need a refill on your cardiac medications before your next appointment, please call your pharmacy*   Lab Work: Your physician recommends that you return for lab work in: Today for A1C, BMP and ProBNP  If you have labs (blood work) drawn today and your tests are completely normal, you will receive your results only by: MyChart Message (if you have MyChart) OR A paper copy in the mail If you have any lab test that is abnormal or we need to change your treatment, we will call you to review the results.   Testing/Procedures: Your physician has requested that you have an echocardiogram. Echocardiography is a painless test that uses sound waves to create images of your heart. It provides your doctor with information about the size and shape of your heart and how well your heart's chambers and valves are working. This procedure takes approximately one hour. There are no restrictions for this procedure. Please do NOT wear cologne, perfume, aftershave, or lotions (deodorant is allowed). Please arrive 15 minutes prior to your appointment time.  Please note: We ask at that you not bring children with you during ultrasound (echo/ vascular) testing. Due to room size and safety concerns, children are not allowed in the ultrasound rooms during exams. Our front office staff cannot provide observation of children in our lobby area while testing is being conducted. An adult accompanying a patient to their appointment will only be allowed in the ultrasound room at the discretion of the ultrasound technician under special circumstances. We apologize for any inconvenience.   Your physician has requested that you have a lexiscan myoview. For further information please visit [email or web address] Please  follow instruction sheet, as given.   The test will take approximately 3 to 4 hours to complete; you may bring reading material.  If someone comes with you to your appointment, they will need to remain in the main lobby due to limited space in the testing area. How to prepare for your Myocardial Perfusion Test:             Do not take Erectile Dysfunction medication 48 hours prior to test. Do not eat or drink 3 hours prior to your test, except you may have water. Do not consume products containing caffeine (regular or decaffeinated) 12 hours prior to your test. (ex: coffee, chocolate, sodas, tea). Do bring a list of your current medications with you.  If not listed below, you may take your medications as normal. Do wear comfortable clothes (no dresses or overalls) and walking shoes, tennis shoes preferred (No heels or open toe shoes are allowed). Do NOT wear cologne, perfume, aftershave, or lotions (deodorant is allowed). If these instructions are not followed, your test will have to be rescheduled.    Follow-Up: At [facility], you and your health needs are our priority.  As part of our continuing mission to provide you with exceptional heart care, we have created designated Provider Care Teams.  These Care Teams include your primary Cardiologist (physician) and Advanced Practice Providers (APPs -  Physician Assistants and Nurse Practitioners) who all work together to provide you with the care you need, when you need it.  We recommend signing up for the patient portal called "MyChart".  Sign up information is provided on this  After Visit Summary.  MyChart is used to connect with patients for Virtual Visits (Telemedicine).  Patients are able to view lab/test results, encounter notes, upcoming appointments, etc.  Non-urgent messages can be sent to your provider as well.   To learn more about what you can do with MyChart, go to [email or web address].    Your next appointment:   4  week(s)  Provider:   Wallis Bamberg, NP [facility])    Other Instructions Check and record BP 2 times daily for 2 weeks and bring log by the office

## 2023-05-29 LAB — HEMOGLOBIN A1C
Est. average glucose Bld gHb Est-mCnc: 151 mg/dL
Hgb A1c MFr Bld: 6.9 % — ABNORMAL HIGH (ref 4.8–5.6)

## 2023-05-29 LAB — BASIC METABOLIC PANEL WITH GFR
BUN/Creatinine Ratio: 25 — ABNORMAL HIGH (ref 9–20)
BUN: 32 mg/dL — ABNORMAL HIGH (ref 6–24)
CO2: 26 mmol/L (ref 20–29)
Calcium: 9 mg/dL (ref 8.7–10.2)
Chloride: 101 mmol/L (ref 96–106)
Creatinine, Ser: 1.27 mg/dL (ref 0.76–1.27)
Glucose: 98 mg/dL (ref 70–99)
Potassium: 4.3 mmol/L (ref 3.5–5.2)
Sodium: 141 mmol/L (ref 134–144)
eGFR: 66 mL/min/1.73

## 2023-05-29 LAB — PRO B NATRIURETIC PEPTIDE: NT-Pro BNP: 24067 pg/mL — ABNORMAL HIGH (ref 0–210)

## 2023-06-02 DIAGNOSIS — I10 Essential (primary) hypertension: Secondary | ICD-10-CM | POA: Diagnosis not present

## 2023-06-02 DIAGNOSIS — I509 Heart failure, unspecified: Secondary | ICD-10-CM | POA: Diagnosis not present

## 2023-06-02 DIAGNOSIS — E1129 Type 2 diabetes mellitus with other diabetic kidney complication: Secondary | ICD-10-CM | POA: Diagnosis not present

## 2023-06-02 DIAGNOSIS — R809 Proteinuria, unspecified: Secondary | ICD-10-CM | POA: Diagnosis not present

## 2023-06-02 DIAGNOSIS — G4700 Insomnia, unspecified: Secondary | ICD-10-CM | POA: Diagnosis not present

## 2023-06-02 DIAGNOSIS — I2510 Atherosclerotic heart disease of native coronary artery without angina pectoris: Secondary | ICD-10-CM | POA: Diagnosis not present

## 2023-06-22 ENCOUNTER — Telehealth: Payer: Self-pay

## 2023-06-22 NOTE — Telephone Encounter (Signed)
 Attempted to contact the patient, he wasn't available. S.Prentiss Hammett CCT

## 2023-06-29 ENCOUNTER — Ambulatory Visit: Payer: 59

## 2023-06-29 ENCOUNTER — Ambulatory Visit: Payer: 59 | Attending: Cardiology

## 2023-07-01 NOTE — Progress Notes (Deleted)
Cardiology Office Note:  .   Date:  07/01/2023  ID:  [name], DOB [birth year], MRN [id number] PCP: Lonie Peak, PA-C  [facility] Providers Cardiologist:  Garwin Brothers, MD    History of Present Illness: .   [name] is a 58 y.o. male with a past medical history of CAD with history of MI s/p DES in 2003, CVA, hypertension, GERD, hepatitis C, bipolar disorder, dyslipidemia, bradycardia, tobacco abuse.  2003 MI s/p DES--unknown number of stents or location, no results available for review  He established care with Dr. Tomie China on 03/19/2021, he had some DOE which could have been an anginal equivalent so the recommendation was for stress evaluation however it does not appear this was completed.  An echocardiogram was also recommended however this was not completed, he was advised to follow-up in 6 months however it appears he was lost to follow-up.  Since then, he has been evaluated twice in the emergency department within the last month for evaluation of shortness of breath--proBNP elevated greater than 26,000, it, given IV Lasix, left AMA and advised to follow-up with cardiology.  He presents today for follow-up of shortness of breath and pedal edema.  He states he has been feeling poorly over the last month.  He is very edematous, blood pressure is elevated. He denies chest pain, palpitations, dyspnea, pnd, orthopnea, n, v, dizziness, syncope, weight gain, or early satiety.    ROS: Review of Systems  Respiratory:  Positive for shortness of breath.   Cardiovascular:  Positive for leg swelling.  Neurological:  Positive for tingling.  All other systems reviewed and are negative.    Studies Reviewed: .           Risk Assessment/Calculations:     No BP recorded.  {Refresh Note OR Click here to enter BP  :1}***       Physical Exam:   VS:  There were no vitals taken for this visit.   Wt Readings from Last 3 Encounters:  05/28/23 163 lb (73.9 kg)  03/19/21 219  lb (99.3 kg)  05/02/19 219 lb (99.3 kg)    GEN: Well nourished, well developed in no acute distress NECK: No JVD; No carotid bruits CARDIAC: RRR, no murmurs, rubs, gallops RESPIRATORY:  Clear to auscultation without rales, wheezing or rhonchi  ABDOMEN: Soft, non-tender, non-distended EXTREMITIES: +3 pitting edema to knees; No deformity   ASSESSMENT AND PLAN: .   CAD-s/p DES 2003--no records available for review, continue aspirin 81 mg daily, continue nitroglycerin as needed, continue Crestor 20 mg daily.  He has having some DOE this is likely related to his volume overload, we will arrange for an ischemic evaluation though as it does not appear there has been one since his reported stent in 2003.  Beta-blocker previously been avoided secondary to cocaine abuse -- Will discuss this at next OV.  Pedal edema with recent pleural effusions-his legs are very edematous, NYHA class II-III -we have no echo on file available for review, Will arrange for an echocardiogram.  Creatinine was 1.20 and potassium 3.9 on 05/23/2023.  Start Lasix 40 mg once daily.  Repeat BMET and proBNP.  Hypertension-blood pressure is very elevated in the office today however is not yet taken his antihypertensive medications and he was in a rush to get here today, continue Norvasc 10 mg daily, continue clonidine 0.2 mg twice daily, continue HCTZ 25 mg daily.  Repeating BMET and will make further recommendations as blood pressure at that  time.  Dyslipidemia-most recent LDL on file for review is elevated at 91 2023, currently on Crestor 20 mg daily--will address this at next OV.  Tobacco abuse-will address at next OV.  Encounter for screening for DM  -mentions that he thinks he has diabetes, does not appear that has been evaluated for this, will check his A1c.  Dispo: Echo, Lexiscan, BMET, proBNP, A1c, start Lasix 40 mg daily.  Return in 4 weeks ?? Did he show for either test    Informed Consent   Shared Decision  Making/Informed Consent The risks [chest pain, shortness of breath, cardiac arrhythmias, dizziness, blood pressure fluctuations, myocardial infarction, stroke/transient ischemic attack, nausea, vomiting, allergic reaction, radiation exposure, metallic taste sensation and life-threatening complications (estimated to be 1 in 10,000)], benefits (risk stratification, diagnosing coronary artery disease, treatment guidance) and alternatives of a nuclear stress test were discussed in detail with [name] and he agrees to proceed.     Dispo: Echo, Lexiscan, BMET, proBNP, A1c, start Lasix 40 mg daily.  Return in 4 weeks  Signed, Flossie Dibble, NP

## 2023-07-02 ENCOUNTER — Ambulatory Visit: Payer: 59 | Attending: Cardiology | Admitting: Cardiology

## 2023-07-10 ENCOUNTER — Inpatient Hospital Stay (HOSPITAL_COMMUNITY)
Admission: EM | Admit: 2023-07-10 | Discharge: 2023-07-22 | DRG: 291 | Disposition: A | Discharge status: Home / Self Care | Payer: 59 | Attending: Internal Medicine | Admitting: Internal Medicine

## 2023-07-10 ENCOUNTER — Emergency Department (HOSPITAL_COMMUNITY): Payer: 59

## 2023-07-10 ENCOUNTER — Encounter (HOSPITAL_COMMUNITY): Payer: Self-pay | Admitting: Internal Medicine

## 2023-07-10 DIAGNOSIS — Z79899 Other long term (current) drug therapy: Secondary | ICD-10-CM | POA: Diagnosis not present

## 2023-07-10 DIAGNOSIS — I252 Old myocardial infarction: Secondary | ICD-10-CM

## 2023-07-10 DIAGNOSIS — F419 Anxiety disorder, unspecified: Secondary | ICD-10-CM | POA: Diagnosis present

## 2023-07-10 DIAGNOSIS — F1410 Cocaine abuse, uncomplicated: Secondary | ICD-10-CM | POA: Diagnosis present

## 2023-07-10 DIAGNOSIS — R0902 Hypoxemia: Secondary | ICD-10-CM | POA: Diagnosis not present

## 2023-07-10 DIAGNOSIS — N5089 Other specified disorders of the male genital organs: Secondary | ICD-10-CM | POA: Diagnosis present

## 2023-07-10 DIAGNOSIS — Z955 Presence of coronary angioplasty implant and graft: Secondary | ICD-10-CM

## 2023-07-10 DIAGNOSIS — E43 Unspecified severe protein-calorie malnutrition: Secondary | ICD-10-CM

## 2023-07-10 DIAGNOSIS — I10 Essential (primary) hypertension: Secondary | ICD-10-CM | POA: Diagnosis present

## 2023-07-10 DIAGNOSIS — I5023 Acute on chronic systolic (congestive) heart failure: Secondary | ICD-10-CM | POA: Diagnosis present

## 2023-07-10 DIAGNOSIS — T782XXA Anaphylactic shock, unspecified, initial encounter: Secondary | ICD-10-CM | POA: Diagnosis not present

## 2023-07-10 DIAGNOSIS — I2510 Atherosclerotic heart disease of native coronary artery without angina pectoris: Secondary | ICD-10-CM | POA: Diagnosis present

## 2023-07-10 DIAGNOSIS — Z1152 Encounter for screening for COVID-19: Secondary | ICD-10-CM

## 2023-07-10 DIAGNOSIS — N179 Acute kidney failure, unspecified: Secondary | ICD-10-CM | POA: Diagnosis present

## 2023-07-10 DIAGNOSIS — R918 Other nonspecific abnormal finding of lung field: Secondary | ICD-10-CM | POA: Diagnosis not present

## 2023-07-10 DIAGNOSIS — K219 Gastro-esophageal reflux disease without esophagitis: Secondary | ICD-10-CM | POA: Diagnosis present

## 2023-07-10 DIAGNOSIS — I509 Heart failure, unspecified: Principal | ICD-10-CM

## 2023-07-10 DIAGNOSIS — J309 Allergic rhinitis, unspecified: Secondary | ICD-10-CM | POA: Diagnosis present

## 2023-07-10 DIAGNOSIS — K761 Chronic passive congestion of liver: Secondary | ICD-10-CM | POA: Diagnosis present

## 2023-07-10 DIAGNOSIS — Z888 Allergy status to other drugs, medicaments and biological substances status: Secondary | ICD-10-CM

## 2023-07-10 DIAGNOSIS — R0602 Shortness of breath: Secondary | ICD-10-CM | POA: Diagnosis not present

## 2023-07-10 DIAGNOSIS — I340 Nonrheumatic mitral (valve) insufficiency: Secondary | ICD-10-CM | POA: Diagnosis present

## 2023-07-10 DIAGNOSIS — Z825 Family history of asthma and other chronic lower respiratory diseases: Secondary | ICD-10-CM

## 2023-07-10 DIAGNOSIS — F14229 Cocaine dependence with intoxication, unspecified: Secondary | ICD-10-CM | POA: Diagnosis present

## 2023-07-10 DIAGNOSIS — I5033 Acute on chronic diastolic (congestive) heart failure: Secondary | ICD-10-CM

## 2023-07-10 DIAGNOSIS — J90 Pleural effusion, not elsewhere classified: Secondary | ICD-10-CM | POA: Diagnosis not present

## 2023-07-10 DIAGNOSIS — Z8249 Family history of ischemic heart disease and other diseases of the circulatory system: Secondary | ICD-10-CM

## 2023-07-10 DIAGNOSIS — I160 Hypertensive urgency: Secondary | ICD-10-CM | POA: Diagnosis present

## 2023-07-10 DIAGNOSIS — F17210 Nicotine dependence, cigarettes, uncomplicated: Secondary | ICD-10-CM | POA: Diagnosis present

## 2023-07-10 DIAGNOSIS — N1831 Chronic kidney disease, stage 3a: Secondary | ICD-10-CM | POA: Diagnosis present

## 2023-07-10 DIAGNOSIS — I472 Ventricular tachycardia, unspecified: Secondary | ICD-10-CM | POA: Diagnosis present

## 2023-07-10 DIAGNOSIS — R0989 Other specified symptoms and signs involving the circulatory and respiratory systems: Secondary | ICD-10-CM | POA: Diagnosis not present

## 2023-07-10 DIAGNOSIS — I130 Hypertensive heart and chronic kidney disease with heart failure and stage 1 through stage 4 chronic kidney disease, or unspecified chronic kidney disease: Principal | ICD-10-CM | POA: Diagnosis present

## 2023-07-10 DIAGNOSIS — I5043 Acute on chronic combined systolic (congestive) and diastolic (congestive) heart failure: Secondary | ICD-10-CM | POA: Diagnosis present

## 2023-07-10 DIAGNOSIS — N183 Chronic kidney disease, stage 3 unspecified: Secondary | ICD-10-CM | POA: Diagnosis not present

## 2023-07-10 DIAGNOSIS — R600 Localized edema: Secondary | ICD-10-CM

## 2023-07-10 DIAGNOSIS — Z8673 Personal history of transient ischemic attack (TIA), and cerebral infarction without residual deficits: Secondary | ICD-10-CM

## 2023-07-10 DIAGNOSIS — E7800 Pure hypercholesterolemia, unspecified: Secondary | ICD-10-CM | POA: Diagnosis present

## 2023-07-10 DIAGNOSIS — E8809 Other disorders of plasma-protein metabolism, not elsewhere classified: Secondary | ICD-10-CM | POA: Diagnosis present

## 2023-07-10 DIAGNOSIS — E119 Type 2 diabetes mellitus without complications: Secondary | ICD-10-CM

## 2023-07-10 DIAGNOSIS — R609 Edema, unspecified: Secondary | ICD-10-CM | POA: Diagnosis not present

## 2023-07-10 DIAGNOSIS — I4729 Other ventricular tachycardia: Secondary | ICD-10-CM | POA: Diagnosis not present

## 2023-07-10 DIAGNOSIS — I110 Hypertensive heart disease with heart failure: Secondary | ICD-10-CM | POA: Diagnosis not present

## 2023-07-10 DIAGNOSIS — T465X6A Underdosing of other antihypertensive drugs, initial encounter: Secondary | ICD-10-CM | POA: Diagnosis present

## 2023-07-10 DIAGNOSIS — R7989 Other specified abnormal findings of blood chemistry: Secondary | ICD-10-CM | POA: Diagnosis present

## 2023-07-10 DIAGNOSIS — F319 Bipolar disorder, unspecified: Secondary | ICD-10-CM | POA: Diagnosis present

## 2023-07-10 DIAGNOSIS — Z6822 Body mass index (BMI) 22.0-22.9, adult: Secondary | ICD-10-CM

## 2023-07-10 DIAGNOSIS — E1122 Type 2 diabetes mellitus with diabetic chronic kidney disease: Secondary | ICD-10-CM | POA: Diagnosis present

## 2023-07-10 DIAGNOSIS — J4489 Other specified chronic obstructive pulmonary disease: Secondary | ICD-10-CM | POA: Diagnosis present

## 2023-07-10 DIAGNOSIS — Z880 Allergy status to penicillin: Secondary | ICD-10-CM

## 2023-07-10 DIAGNOSIS — R6889 Other general symptoms and signs: Secondary | ICD-10-CM | POA: Diagnosis not present

## 2023-07-10 DIAGNOSIS — Z7982 Long term (current) use of aspirin: Secondary | ICD-10-CM

## 2023-07-10 DIAGNOSIS — Z743 Need for continuous supervision: Secondary | ICD-10-CM | POA: Diagnosis not present

## 2023-07-10 DIAGNOSIS — Z91148 Patient's other noncompliance with medication regimen for other reason: Secondary | ICD-10-CM

## 2023-07-10 DIAGNOSIS — Z823 Family history of stroke: Secondary | ICD-10-CM

## 2023-07-10 DIAGNOSIS — Z91199 Patient's noncompliance with other medical treatment and regimen due to unspecified reason: Secondary | ICD-10-CM

## 2023-07-10 HISTORY — DX: Acute kidney failure, unspecified: N17.9

## 2023-07-10 HISTORY — DX: Localized edema: R60.0

## 2023-07-10 HISTORY — DX: Type 2 diabetes mellitus without complications: E11.9

## 2023-07-10 HISTORY — DX: Other specified abnormal findings of blood chemistry: R79.89

## 2023-07-10 LAB — COMPREHENSIVE METABOLIC PANEL
ALT: 54 U/L — ABNORMAL HIGH (ref 0–44)
AST: 50 U/L — ABNORMAL HIGH (ref 15–41)
Albumin: 2.7 g/dL — ABNORMAL LOW (ref 3.5–5.0)
Alkaline Phosphatase: 116 U/L (ref 38–126)
Anion gap: 10 (ref 5–15)
BUN: 52 mg/dL — ABNORMAL HIGH (ref 6–20)
CO2: 21 mmol/L — ABNORMAL LOW (ref 22–32)
Calcium: 8.7 mg/dL — ABNORMAL LOW (ref 8.9–10.3)
Chloride: 106 mmol/L (ref 98–111)
Creatinine, Ser: 1.84 mg/dL — ABNORMAL HIGH (ref 0.61–1.24)
GFR, Estimated: 42 mL/min — ABNORMAL LOW (ref >60)
Glucose, Bld: 119 mg/dL — ABNORMAL HIGH (ref 70–99)
Potassium: 5 mmol/L (ref 3.5–5.1)
Sodium: 137 mmol/L (ref 135–145)
Total Bilirubin: 1.5 mg/dL — ABNORMAL HIGH (ref 0.0–1.2)
Total Protein: 5.2 g/dL — ABNORMAL LOW (ref 6.5–8.1)

## 2023-07-10 LAB — RESP PANEL BY RT-PCR (RSV, FLU A&B, COVID)  RVPGX2
Influenza A by PCR: NEGATIVE
Influenza B by PCR: NEGATIVE
Resp Syncytial Virus by PCR: NEGATIVE
SARS Coronavirus 2 by RT PCR: NEGATIVE

## 2023-07-10 LAB — LIPID PANEL
Cholesterol: 218 mg/dL — ABNORMAL HIGH (ref 0–200)
HDL: 44 mg/dL (ref >40)
LDL Cholesterol: 152 mg/dL — ABNORMAL HIGH (ref 0–99)
Total CHOL/HDL Ratio: 5 {ratio}
Triglycerides: 108 mg/dL (ref <150)
VLDL: 22 mg/dL (ref 0–40)

## 2023-07-10 LAB — RAPID URINE DRUG SCREEN, HOSP PERFORMED
Amphetamines: NOT DETECTED
Barbiturates: NOT DETECTED
Benzodiazepines: NOT DETECTED
Cocaine: POSITIVE — AB
Opiates: NOT DETECTED
Tetrahydrocannabinol: NOT DETECTED

## 2023-07-10 LAB — CBC
HCT: 44.9 % (ref 39.0–52.0)
HCT: 47.9 % (ref 39.0–52.0)
Hemoglobin: 14.8 g/dL (ref 13.0–17.0)
Hemoglobin: 16.1 g/dL (ref 13.0–17.0)
MCH: 30.9 pg (ref 26.0–34.0)
MCH: 31.2 pg (ref 26.0–34.0)
MCHC: 33 g/dL (ref 30.0–36.0)
MCHC: 33.6 g/dL (ref 30.0–36.0)
MCV: 93.7 fL (ref 80.0–100.0)
MCV: 92.8 fL (ref 80.0–100.0)
Platelets: 204 10*3/uL (ref 150–400)
Platelets: 232 10*3/uL (ref 150–400)
RBC: 4.79 MIL/uL (ref 4.22–5.81)
RBC: 5.16 MIL/uL (ref 4.22–5.81)
RDW: 16.6 % — ABNORMAL HIGH (ref 11.5–15.5)
RDW: 16.8 % — ABNORMAL HIGH (ref 11.5–15.5)
WBC: 9.5 10*3/uL (ref 4.0–10.5)
WBC: 9.9 10*3/uL (ref 4.0–10.5)
nRBC: 0 % (ref 0.0–0.2)
nRBC: 0 % (ref 0.0–0.2)

## 2023-07-10 LAB — CREATININE, SERUM
Creatinine, Ser: 1.8 mg/dL — ABNORMAL HIGH (ref 0.61–1.24)
GFR, Estimated: 43 mL/min — ABNORMAL LOW (ref >60)

## 2023-07-10 LAB — URINALYSIS, ROUTINE W REFLEX MICROSCOPIC
Bacteria, UA: NONE SEEN
Bilirubin Urine: NEGATIVE
Glucose, UA: NEGATIVE mg/dL
Ketones, ur: NEGATIVE mg/dL
Leukocytes,Ua: NEGATIVE
Nitrite: NEGATIVE
Protein, ur: 100 mg/dL — AB
Specific Gravity, Urine: 1.014 (ref 1.005–1.030)
pH: 5 (ref 5.0–8.0)

## 2023-07-10 LAB — TSH: TSH: 4.268 u[IU]/mL (ref 0.350–4.500)

## 2023-07-10 LAB — ETHANOL: Alcohol, Ethyl (B): <10 mg/dL (ref <10)

## 2023-07-10 LAB — TROPONIN I (HIGH SENSITIVITY)
Troponin I (High Sensitivity): 91 ng/L — ABNORMAL HIGH (ref <18)
Troponin I (High Sensitivity): 78 ng/L — ABNORMAL HIGH

## 2023-07-10 LAB — BRAIN NATRIURETIC PEPTIDE: B Natriuretic Peptide: >4500 pg/mL — ABNORMAL HIGH (ref 0.0–100.0)

## 2023-07-10 LAB — HIV ANTIBODY (ROUTINE TESTING W REFLEX): HIV Screen 4th Generation wRfx: NONREACTIVE

## 2023-07-10 MED ORDER — HYDRALAZINE HCL 20 MG/ML IJ SOLN
5.0000 mg | INTRAMUSCULAR | Status: DC | PRN
  Filled 2023-07-10: qty 1

## 2023-07-10 MED ORDER — HEPARIN SODIUM (PORCINE) 5000 UNIT/ML IJ SOLN
5000.0000 [IU] | Freq: Three times a day (TID) | INTRAMUSCULAR | Status: DC
  Administered 2023-07-10 – 2023-07-22 (×34): 5000 [IU] via SUBCUTANEOUS
  Filled 2023-07-10 (×35): qty 1

## 2023-07-10 MED ORDER — AMLODIPINE BESYLATE 5 MG PO TABS
10.0000 mg | ORAL_TABLET | Freq: Once | ORAL | Status: AC
Start: 2023-07-10 — End: 2023-07-10
  Administered 2023-07-10: 10 mg via ORAL
  Filled 2023-07-10: qty 2

## 2023-07-10 MED ORDER — FUROSEMIDE 10 MG/ML IJ SOLN
40.0000 mg | Freq: Two times a day (BID) | INTRAMUSCULAR | Status: AC
  Administered 2023-07-11 (×2): 40 mg via INTRAVENOUS
  Filled 2023-07-10 (×2): qty 4

## 2023-07-10 MED ORDER — HYDRALAZINE HCL 20 MG/ML IJ SOLN
10.0000 mg | Freq: Once | INTRAMUSCULAR | Status: AC
  Administered 2023-07-10: 10 mg via INTRAVENOUS
  Filled 2023-07-10: qty 1

## 2023-07-10 MED ORDER — NITROGLYCERIN 0.4 MG SL SUBL: 0.4000 mg | SUBLINGUAL_TABLET | SUBLINGUAL | Status: DC | PRN

## 2023-07-10 MED ORDER — FUROSEMIDE 10 MG/ML IJ SOLN
40.0000 mg | Freq: Once | INTRAMUSCULAR | Status: AC
  Administered 2023-07-10: 40 mg via INTRAVENOUS
  Filled 2023-07-10: qty 4

## 2023-07-10 MED ORDER — SODIUM CHLORIDE 0.9% FLUSH
3.0000 mL | Freq: Two times a day (BID) | INTRAVENOUS | Status: DC
  Administered 2023-07-10 – 2023-07-21 (×21): 3 mL via INTRAVENOUS

## 2023-07-10 MED ORDER — CLONIDINE HCL 0.2 MG PO TABS
0.2000 mg | ORAL_TABLET | Freq: Once | ORAL | Status: AC
Start: 2023-07-10 — End: 2023-07-10
  Administered 2023-07-10: 0.2 mg via ORAL
  Filled 2023-07-10: qty 1

## 2023-07-10 MED ORDER — ONDANSETRON HCL 4 MG/2ML IJ SOLN: 4.0000 mg | Freq: Four times a day (QID) | INTRAMUSCULAR | Status: DC | PRN

## 2023-07-10 MED ORDER — ONDANSETRON HCL 4 MG PO TABS: 4.0000 mg | ORAL_TABLET | Freq: Four times a day (QID) | ORAL | Status: DC | PRN

## 2023-07-10 MED ORDER — PANTOPRAZOLE SODIUM 40 MG IV SOLR
40.0000 mg | INTRAVENOUS | Status: DC
  Administered 2023-07-10 – 2023-07-11 (×2): 40 mg via INTRAVENOUS
  Filled 2023-07-10 (×2): qty 10

## 2023-07-10 MED ORDER — ACETAMINOPHEN 325 MG PO TABS
650.0000 mg | ORAL_TABLET | Freq: Four times a day (QID) | ORAL | Status: DC | PRN
  Administered 2023-07-13 – 2023-07-18 (×6): 650 mg via ORAL
  Filled 2023-07-10 (×6): qty 2

## 2023-07-10 MED ORDER — HYDRALAZINE HCL 20 MG/ML IJ SOLN
20.0000 mg | Freq: Once | INTRAMUSCULAR | Status: AC
  Administered 2023-07-10: 20 mg via INTRAVENOUS
  Filled 2023-07-10: qty 1

## 2023-07-10 MED ORDER — INSULIN ASPART 100 UNIT/ML IJ SOLN
0.0000 [IU] | Freq: Three times a day (TID) | INTRAMUSCULAR | Status: DC
  Administered 2023-07-11 (×2): 3 [IU] via SUBCUTANEOUS
  Administered 2023-07-11: 5 [IU] via SUBCUTANEOUS
  Administered 2023-07-12 (×3): 3 [IU] via SUBCUTANEOUS
  Administered 2023-07-13: 11 [IU] via SUBCUTANEOUS
  Administered 2023-07-13 (×2): 5 [IU] via SUBCUTANEOUS
  Administered 2023-07-14: 3 [IU] via SUBCUTANEOUS
  Administered 2023-07-14: 2 [IU] via SUBCUTANEOUS
  Administered 2023-07-14: 3 [IU] via SUBCUTANEOUS
  Administered 2023-07-15: 2 [IU] via SUBCUTANEOUS
  Administered 2023-07-16 (×2): 3 [IU] via SUBCUTANEOUS
  Administered 2023-07-16: 5 [IU] via SUBCUTANEOUS
  Administered 2023-07-17: 3 [IU] via SUBCUTANEOUS
  Administered 2023-07-18: 2 [IU] via SUBCUTANEOUS
  Administered 2023-07-18: 3 [IU] via SUBCUTANEOUS
  Administered 2023-07-18 – 2023-07-19 (×2): 2 [IU] via SUBCUTANEOUS
  Administered 2023-07-19: 8 [IU] via SUBCUTANEOUS
  Administered 2023-07-19: 3 [IU] via SUBCUTANEOUS
  Administered 2023-07-20: 2 [IU] via SUBCUTANEOUS
  Administered 2023-07-20 – 2023-07-21 (×3): 3 [IU] via SUBCUTANEOUS
  Administered 2023-07-21 (×2): 2 [IU] via SUBCUTANEOUS
  Administered 2023-07-22: 3 [IU] via SUBCUTANEOUS

## 2023-07-10 MED ORDER — LABETALOL HCL 5 MG/ML IV SOLN
10.0000 mg | Freq: Once | INTRAVENOUS | Status: DC
Start: 2023-07-10 — End: 2023-07-10

## 2023-07-10 MED ORDER — NICOTINE 14 MG/24HR TD PT24
14.0000 mg | MEDICATED_PATCH | Freq: Every day | TRANSDERMAL | Status: DC
Start: 2023-07-10 — End: 2023-07-22
  Administered 2023-07-10 – 2023-07-22 (×13): 14 mg via TRANSDERMAL
  Filled 2023-07-10 (×13): qty 1

## 2023-07-10 MED ORDER — ACETAMINOPHEN 650 MG RE SUPP: 650.0000 mg | Freq: Four times a day (QID) | RECTAL | Status: DC | PRN

## 2023-07-10 NOTE — Assessment & Plan Note (Addendum)
Nicotine patch counseling once patient is stable.

## 2023-07-10 NOTE — Assessment & Plan Note (Addendum)
Patient presenting with bilateral lower extremity edema extending from his legs to his groin and lower abdomen anasarca picture, elevated BNP of 4500, judging from his clinical features suspect a combination of systolic and diastolic congestive heart failure no recent echoes, last echocardiogram in our chart is in 2011 where his function was noted to be within normal limits. Lasix 40 mg bid x 2 doses.

## 2023-07-10 NOTE — ED Notes (Signed)
Dinner tray ordered.

## 2023-07-10 NOTE — Assessment & Plan Note (Addendum)
Home regimen includes clonidine, amlodipine, HCTZ. Will continue patient on clonidine to avoid rebound hypertension, amlodipine, hold HCTZ to allow room for diuretic therapy with Lasix . Strict I/O. Daily weights.  2 d echo with bubble study.  Cardiology consult  per am team.

## 2023-07-10 NOTE — Consult Note (Signed)
Cardiology Consultation   Patient ID: [name] MRN: [id number]; DOB: December 11, 1965  Admit date: 07/10/2023 Date of Consult: 07/10/2023  PCP:  Aviva Kluver   [facility] Providers Cardiologist:  Garwin Brothers, MD        Patient Profile:   [name] is a 58 y.o. male with multiple medical comorbidities who is being seen 07/10/2023 for the evaluation of SOB and LE edema at the request of ED.  History of Present Illness:   [name] reports noncompliance with his meds for at least 3 weeks, and presents today with "hyperventilation" and swelling, especially scrotal swelling.  His daughter (who doesn't live with him) is present in the room.  "I'm sick because they won't get me oxygen at home!"  No chest discomfort, no dizziness Not cooperative with several questions. Daughter notes that he actively smokes. Labs here also positive for cocaine.  Chart review shows that an echo was ordered in cardiology clinic in 05/2023, when he presented with SOB/LE edema and overall fatigue.  Note describes an MI in 2003 with possible DES.  Prior schedules for echo and stress testing were never done due to loss to follow-up. The only echo in the system was more than 10 years ago w/ a preserved EF at that time.  In clinic, he was noted to be on amlodipine, clonidine, and hydrochlorothiazide but often nonadherent and many visits presented with HTN.  In the ED, he received lasix 40 IV and put out 2L of urine already Also got hydralazine twice, amlodipine 10, clonidine 0.2 with last BP improved to 148/97 Satting 96% on RA, although pt still feels dyspneic   Past Medical History:  Diagnosis Date   Allergic rhinitis    Anxiety    Asthma    Bipolar disorder (HCC)    Bradycardia    CAD (coronary artery disease)    CVA (cerebral vascular accident) (HCC)    GERD (gastroesophageal reflux disease)    Headache    Hepatitis C    History of heart artery stent    History of myocardial  infarction    History of stroke    Hypercholesterolemia    Hypertension    MI (myocardial infarction) (HCC)    Stroke [facility])     Past Surgical History:  Procedure Laterality Date   CORONARY STENT PLACEMENT     x2 stents        Inpatient Medications: Scheduled Meds:  Continuous Infusions:  PRN Meds:   Allergies:    Allergies  Allergen Reactions   Ace Inhibitors Swelling   Penicillins Swelling    Throat and eye swelling Has patient had a PCN reaction causing immediate rash, facial/tongue/throat swelling, SOB or lightheadedness with hypotension: Yes Has patient had a PCN reaction causing severe rash involving mucus membranes or skin necrosis: No Has patient had a PCN reaction that required hospitalization: No Has patient had a PCN reaction occurring within the last 10 years: No If all of the above answers are "NO", then may proceed with Cephalosporin use.    Social History:   Social History   Socioeconomic History   Marital status: Single    Spouse name: Not on file   Number of children: Not on file   Years of education: Not on file   Highest education level: Not on file  Occupational History   Not on file  Tobacco Use   Smoking status: Every Day    Current packs/day: 0.50    Types:  Cigarettes   Smokeless tobacco: Never  Substance and Sexual Activity   Alcohol use: No   Drug use: No    Comment: last use cocaine "a couple months ago"   Sexual activity: Not on file  Other Topics Concern   Not on file  Social History Narrative   Not on file   Social Drivers of Health   Financial Resource Strain: Not on file  Food Insecurity: Not on file  Transportation Needs: Not on file  Physical Activity: Not on file  Stress: Not on file  Social Connections: Not on file  Intimate Partner Violence: Not on file    Family History:   Unable to elicit today Family History  Problem Relation Age of Onset   Colon polyps Mother    Stroke Father    Heart disease  Maternal Grandmother    Hearing loss Maternal Grandmother    Skin cancer Maternal Grandmother    Asthma Maternal Aunt    Emphysema Maternal Aunt    Arthritis Maternal Uncle    Colon cancer Neg Hx    Esophageal cancer Neg Hx    Rectal cancer Neg Hx    Stomach cancer Neg Hx      ROS:  Please see the history of present illness.  As above All other ROS reviewed and negative.     Physical Exam/Data:   Vitals:   07/10/23 1547 07/10/23 1600 07/10/23 1645 07/10/23 1730  BP:  (!) 178/130 (!) 172/117 (!) 148/97  Pulse:  92 88 87  Resp:  20 19 16   Temp: 98.4 F (36.9 C)     TempSrc: Oral     SpO2:  100% 100% 100%    Intake/Output Summary (Last 24 hours) at 07/10/2023 1855 Last data filed at 07/10/2023 1851 Gross per 24 hour  Intake --  Output 2410 ml  Net -2410 ml      05/28/2023    7:55 AM 03/19/2021    8:32 AM 05/02/2019    8:03 AM  Last 3 Weights  Weight (lbs) 163 lb 219 lb 219 lb  Weight (kg) 73.936 kg 99.338 kg 99.338 kg     There is no height or weight on file to calculate BMI.  General:  Well nourished, well developed, tachypneic in the 20s HEENT: normal Neck: elevated JVP Vascular: Distal pulses 2+ bilaterally Cardiac:  normal S1, S2; RRR; no murmur appreciable Lungs:  clear to auscultation bilaterally, no wheezing, rhonchi or rales  Abd: soft, nontender, no hepatomegaly  Ext: 3+ edema with chronic stasis skin changes Musculoskeletal:  No deformities, BUE and BLE strength normal and equal Skin: warm and dry  Neuro:  CNs 2-12 intact, no focal abnormalities noted Psych:  Normal affect   EKG:  The EKG was personally reviewed and demonstrates:  sinus rhythm with profound LVH (high voltages with repol changes) Telemetry:  Telemetry was personally reviewed and demonstrates:  sinus rhythm  Laboratory Data:  High Sensitivity Troponin:   Recent Labs  Lab 07/10/23 1549 07/10/23 1752  TROPONINIHS 91* 78*     Chemistry Recent Labs  Lab 07/10/23 1549  NA 137   K 5.0  CL 106  CO2 21*  GLUCOSE 119*  BUN 52*  CREATININE 1.84*  CALCIUM 8.7*  GFRNONAA 42*  ANIONGAP 10    Recent Labs  Lab 07/10/23 1549  PROT 5.2*  ALBUMIN 2.7*  AST 50*  ALT 54*  ALKPHOS 116  BILITOT 1.5*   Lipids No results for input(s): "CHOL", "TRIG", "HDL", "LABVLDL", "LDLCALC", "  CHOLHDL" in the last 168 hours.  Hematology Recent Labs  Lab 07/10/23 1549  WBC 9.5  RBC 4.79  HGB 14.8  HCT 44.9  MCV 93.7  MCH 30.9  MCHC 33.0  RDW 16.6*  PLT 204   Thyroid No results for input(s): "TSH", "FREET4" in the last 168 hours.  BNP Recent Labs  Lab 07/10/23 1549  BNP >4,500.0*    DDimer No results for input(s): "DDIMER" in the last 168 hours.   Radiology/Studies:  DG Chest Port 1 View Result Date: 07/10/2023 CLINICAL DATA:  Shortness of breath EXAM: PORTABLE CHEST 1 VIEW COMPARISON:  Chest radiograph dated 01/15/2010 FINDINGS: Patient is rotated to the right. Low lung volumes with bronchovascular crowding. Diffuse interstitial opacities. Small right pleural effusion. No pneumothorax. Enlarged cardiomediastinal silhouette. No acute osseous abnormality. IMPRESSION: 1. Low lung volumes with bronchovascular crowding. Diffuse interstitial opacities, likely pulmonary edema. 2. Small right pleural effusion. 3. Marked cardiomegaly. Electronically Signed   By: Agustin Cree M.D.   On: 07/10/2023 17:01     Assessment and Plan:  Acute on chronic volume overload likely secondary to diastolic HF.  Mild troponin elevation, which I suspect is secondary to heart failure, no ischemic symptoms or ECG changes suggestive of ACS. Also acute on chronic kidney disease, with Cr more elevated c/w last check in December.  Recommend echo in the morning. Although stress testing was ordered as an outpatient, I'm not sure there's much utility unless his EF is profoundly depressed on echo because he is not a good PCI or surgical candidate given comorbidities, active substance use, and history of  medication non-adherence.  Suggest: Lasix 40 IV bid with close Cr monitoring. Continue amlodipine and clonidine (some of this HTN may be due to rebound hypertension) He is allergic to ACE/I, so would hold off If his SBP >150, can add a long acting nitrate In the short term, can use hydralazine prn, but not a good antihypertensive for him as an outpatient due to its TID/QID dosing schedule and his known non-adherence. Would empirically start rosuvastatin 20 mg daily as well as an ASA 81 (prior CAD/CVA) Hold BB due to cocaine use.    Risk Assessment/Risk Scores:        New York Heart Association (NYHA) Functional Class NYHA Class III        For questions or updates, please contact [facility] Please consult [email or web address] for contact info under    Signed, Eyvonne Left, MD  07/10/2023 6:55 PM

## 2023-07-10 NOTE — Assessment & Plan Note (Addendum)
Lab Results  Component Value Date   CREATININE 1.84 (H) 07/10/2023   CREATININE 1.27 05/28/2023   CREATININE 1.60 (H) 06/27/2017  Suspect secondary to hypertensive urgency and cocaine abuse.Renally dose medications avoid nephrotoxic agents and contrast studies.

## 2023-07-10 NOTE — Assessment & Plan Note (Addendum)
PPI therapy and aspiration precaution.

## 2023-07-10 NOTE — Assessment & Plan Note (Addendum)
Counseling and TOC consult for substance abuse resource.

## 2023-07-10 NOTE — Assessment & Plan Note (Addendum)
Transaminitis secondary to passive hepatic congestion from CHF exacerbation. Will monitor low threshold for liver ultrasound.

## 2023-07-10 NOTE — Assessment & Plan Note (Addendum)
Suspect this may be a new finding based on chart review his last A1c was 6.9.  In light of his heart disease and vascular disease we will start patient on treatment with glycemic protocol and insulin as deemed appropriate based on kidney function.  New diabetes mellitus counseling.  Diabetes education.  And additional resources.

## 2023-07-10 NOTE — Assessment & Plan Note (Addendum)
Patient has history of heart disease, continue patient on aspirin 81 and Crestor..  Nitroglycerin. Currently patient is chest pain-free suspect elevated troponin of 91 secondary to demand ischemia. Will continue to follow. Beta-blocker avoided secondary to cocaine abuse.

## 2023-07-10 NOTE — H&P (Signed)
History and Physical    Patient: [name] MWN:[id number] DOB: 23-Aug-1965 DOA: 07/10/2023 DOS: the patient was seen and examined on 07/10/2023 PCP: Pcp, No  Patient coming from: Home Chief complaint: Chief Complaint  Patient presents with   Leg Swelling   HPI:  [name] is a 58 y.o. male with past medical history  of allergies to penicillin and ACE inhibitor, cocaine abuse, tobacco abuse, essential hypertension, anxiety, bipolar disorder, history of heart disease with history of MI status post drug-eluting stent in 2003, history of stroke of medical noncompliance chart review shows that patient has been scheduled for cardiology appointments that he has missed in the past,, diabetes mellitus type 2 with most recent A1c of 6.9 in December 2024 no recent admission noted.  No echocardiogram in the past noted on Care Everywhere presenting today with edema extending from his legs to his groin. Chart review does show that patient had cardiology visit on 13 December where he has had echocardiogram ordered.  >>ED Course: In emergency room patient is alert awake oriented.  No distress. Vitals:   07/10/23 1547 07/10/23 1600 07/10/23 1645 07/10/23 1730  BP:  (!) 178/130 (!) 172/117 (!) 148/97  Pulse:  92 88 87  Temp: 98.4 F (36.9 C)     Resp:  20 19 16   SpO2:  100% 100% 100%  TempSrc: Oral     ED evaluation  so far shows: EKG shows far shows sinus rhythm at 78 with LVH, anterior Q waves suspect from previous MI, QTc 429.  Overall tracing quality is poor we will repeat an EKG, T wave inversion in lateral leads .  Troponin of 91.  BNP of more than 4500. Metabolic panel shows BUN of 52 AKI with a creatinine of 1.84 EGFR of 42, abnormal LFTs with AST of 50 ALT of 54 total bili of 1.5, albumin of 2.4. CBC shows normal white count normal hemoglobin and normal platelets of 204 RDW of 16.6.   In the emergency room  pt has received the following treatment thus far: Medications  furosemide  (LASIX) injection 40 mg (40 mg Intravenous Given 07/10/23 1604)  hydrALAZINE (APRESOLINE) injection 10 mg (10 mg Intravenous Given 07/10/23 1603)  amLODipine (NORVASC) tablet 10 mg (10 mg Oral Given 07/10/23 1743)  cloNIDine (CATAPRES) tablet 0.2 mg (0.2 mg Oral Given 07/10/23 1743)  hydrALAZINE (APRESOLINE) injection 20 mg (20 mg Intravenous Given 07/10/23 1747)     Review of Systems  Cardiovascular:  Positive for leg swelling.   Past Medical History:  Diagnosis Date   Allergic rhinitis    Anxiety    Asthma    Bipolar disorder (HCC)    Bradycardia    CAD (coronary artery disease)    CVA (cerebral vascular accident) (HCC)    GERD (gastroesophageal reflux disease)    Headache    Hepatitis C    History of heart artery stent    History of myocardial infarction    History of stroke    Hypercholesterolemia    Hypertension    MI (myocardial infarction) (HCC)    Stroke [facility])    Past Surgical History:  Procedure Laterality Date   CORONARY STENT PLACEMENT     x2 stents     reports that he has been smoking cigarettes. He has never used smokeless tobacco. He reports that he does not drink alcohol and does not use drugs.  Allergies  Allergen Reactions   Ace Inhibitors Swelling   Penicillins Swelling    Throat  and eye swelling Has patient had a PCN reaction causing immediate rash, facial/tongue/throat swelling, SOB or lightheadedness with hypotension: Yes Has patient had a PCN reaction causing severe rash involving mucus membranes or skin necrosis: No Has patient had a PCN reaction that required hospitalization: No Has patient had a PCN reaction occurring within the last 10 years: No If all of the above answers are "NO", then may proceed with Cephalosporin use.    Family History  Problem Relation Age of Onset   Colon polyps Mother    Stroke Father    Heart disease Maternal Grandmother    Hearing loss Maternal Grandmother    Skin cancer Maternal Grandmother    Asthma Maternal  Aunt    Emphysema Maternal Aunt    Arthritis Maternal Uncle    Colon cancer Neg Hx    Esophageal cancer Neg Hx    Rectal cancer Neg Hx    Stomach cancer Neg Hx     Prior to Admission medications   Medication Sig Start Date End Date Taking? Authorizing Provider  albuterol (VENTOLIN HFA) 108 (90 Base) MCG/ACT inhaler Inhale 2 puffs into the lungs every 4 (four) hours as needed for wheezing. 04/24/23   [provider]  amLODipine (NORVASC) 10 MG tablet Take 10 mg by mouth daily. 04/10/19   [provider]  aspirin 81 MG EC tablet Chew 81 mg by mouth daily.    [provider]  busPIRone (BUSPAR) 15 MG tablet Take 7.5 mg by mouth 2 (two) times daily. 01/10/19   [provider]  cloNIDine (CATAPRES) 0.2 MG tablet Take 0.2 mg by mouth 2 (two) times daily.    [provider]  finasteride (PROSCAR) 5 MG tablet Take 5 mg by mouth daily. 01/25/21   [provider]  furosemide (LASIX) 40 MG tablet Take 1 tablet (40 mg total) by mouth daily. 05/28/23   Flossie Dibble, NP  gabapentin (NEURONTIN) 100 MG capsule Take 100 mg by mouth 3 (three) times daily as needed (PAIN).    [provider]  hydrochlorothiazide (HYDRODIURIL) 25 MG tablet Take 25 mg by mouth every morning. for blood pressure 04/09/17   [provider]  meloxicam (MOBIC) 7.5 MG tablet Take 7.5 mg by mouth daily. 06/18/17   [provider]  nitroGLYCERIN (NITROSTAT) 0.4 MG SL tablet Place 1 tablet (0.4 mg total) under the tongue every 5 (five) minutes as needed. 03/19/21 06/17/21  Revankar, Aundra Dubin, MD  rosuvastatin (CRESTOR) 20 MG tablet Take 20 mg by mouth daily. 01/16/21   [provider]  tamsulosin (FLOMAX) 0.4 MG CAPS capsule Take 0.4 mg by mouth daily. 04/09/17   [provider]  tiZANidine (ZANAFLEX) 4 MG tablet Take 4 mg by mouth 3 (three) times daily as needed for muscle spasms. 01/17/21   [provider]  traZODone (DESYREL) 100 MG  tablet Take 100 mg by mouth at bedtime. 06/18/17   [provider]     Vitals:   07/10/23 1547 07/10/23 1600 07/10/23 1645 07/10/23 1730  BP:  (!) 178/130 (!) 172/117 (!) 148/97  Pulse:  92 88 87  Resp:  20 19 16   Temp: 98.4 F (36.9 C)     TempSrc: Oral     SpO2:  100% 100% 100%   Physical Exam Vitals and nursing note reviewed.  Constitutional:      General: He is not in acute distress. HENT:     Head: Normocephalic and atraumatic.     Right Ear: Hearing  normal.     Left Ear: Hearing normal.     Nose: Nose normal. No nasal deformity.     Mouth/Throat:     Lips: Pink.     Tongue: No lesions.     Pharynx: Oropharynx is clear.  Eyes:     General: Lids are normal.     Extraocular Movements: Extraocular movements intact.  Cardiovascular:     Rate and Rhythm: Normal rate and regular rhythm.     Heart sounds: Normal heart sounds.  Pulmonary:     Effort: Pulmonary effort is normal.     Breath sounds: Normal breath sounds.  Abdominal:     General: Bowel sounds are normal. There is no distension.     Palpations: Abdomen is soft. There is no mass.     Tenderness: There is no abdominal tenderness.  Musculoskeletal:        General: Swelling present.     Right lower leg: Edema present.     Left lower leg: Edema present.  Skin:    General: Skin is warm.  Neurological:     General: No focal deficit present.     Mental Status: He is alert and oriented to person, place, and time.     Cranial Nerves: Cranial nerves 2-12 are intact.  Psychiatric:        Attention and Perception: Attention normal.        Speech: Speech normal.        Behavior: Behavior is cooperative.      Labs on Admission: I have personally reviewed following labs and imaging studies Results for orders placed or performed during the hospital encounter of 07/10/23 (from the past 24 hours)  Resp panel by RT-PCR (RSV, Flu A&B, Covid) Anterior Nasal Swab     Status: None   Collection Time: 07/10/23  3:49  PM   Specimen: Anterior Nasal Swab  Result Value Ref Range   SARS Coronavirus 2 by RT PCR NEGATIVE NEGATIVE   Influenza A by PCR NEGATIVE NEGATIVE   Influenza B by PCR NEGATIVE NEGATIVE   Resp Syncytial Virus by PCR NEGATIVE NEGATIVE  Comprehensive metabolic panel     Status: Abnormal   Collection Time: 07/10/23  3:49 PM  Result Value Ref Range   Sodium 137 135 - 145 mmol/L   Potassium 5.0 3.5 - 5.1 mmol/L   Chloride 106 98 - 111 mmol/L   CO2 21 (L) 22 - 32 mmol/L   Glucose, Bld 119 (H) 70 - 99 mg/dL   BUN 52 (H) 6 - 20 mg/dL   Creatinine, Ser 1.61 (H) 0.61 - 1.24 mg/dL   Calcium 8.7 (L) 8.9 - 10.3 mg/dL   Total Protein 5.2 (L) 6.5 - 8.1 g/dL   Albumin 2.7 (L) 3.5 - 5.0 g/dL   AST 50 (H) 15 - 41 U/L   ALT 54 (H) 0 - 44 U/L   Alkaline Phosphatase 116 38 - 126 U/L   Total Bilirubin 1.5 (H) 0.0 - 1.2 mg/dL   GFR, Estimated 42 (L) >60 mL/min   Anion gap 10 5 - 15  CBC     Status: Abnormal   Collection Time: 07/10/23  3:49 PM  Result Value Ref Range   WBC 9.5 4.0 - 10.5 K/uL   RBC 4.79 4.22 - 5.81 MIL/uL   Hemoglobin 14.8 13.0 - 17.0 g/dL   HCT 09.6 04.5 - 40.9 %   MCV 93.7 80.0 - 100.0 fL   MCH 30.9 26.0 - 34.0 pg  MCHC 33.0 30.0 - 36.0 g/dL   RDW 16.1 (H) 09.6 - 04.5 %   Platelets 204 150 - 400 K/uL   nRBC 0.0 0.0 - 0.2 %  Brain natriuretic peptide     Status: Abnormal   Collection Time: 07/10/23  3:49 PM  Result Value Ref Range   B Natriuretic Peptide >4,500.0 (H) 0.0 - 100.0 pg/mL  Troponin I (High Sensitivity)     Status: Abnormal   Collection Time: 07/10/23  3:49 PM  Result Value Ref Range   Troponin I (High Sensitivity) 91 (H) <18 ng/L  Ethanol     Status: None   Collection Time: 07/10/23  3:49 PM  Result Value Ref Range   Alcohol, Ethyl (B) <10 <10 mg/dL  Urinalysis, Routine w reflex microscopic -Urine, Clean Catch     Status: Abnormal   Collection Time: 07/10/23  3:55 PM  Result Value Ref Range   Color, Urine YELLOW YELLOW   APPearance CLEAR CLEAR    Specific Gravity, Urine 1.014 1.005 - 1.030   pH 5.0 5.0 - 8.0   Glucose, UA NEGATIVE NEGATIVE mg/dL   Hgb urine dipstick SMALL (A) NEGATIVE   Bilirubin Urine NEGATIVE NEGATIVE   Ketones, ur NEGATIVE NEGATIVE mg/dL   Protein, ur 409 (A) NEGATIVE mg/dL   Nitrite NEGATIVE NEGATIVE   Leukocytes,Ua NEGATIVE NEGATIVE   RBC / HPF 0-5 0 - 5 RBC/hpf   WBC, UA 0-5 0 - 5 WBC/hpf   Bacteria, UA NONE SEEN NONE SEEN   Squamous Epithelial / HPF 0-5 0 - 5 /HPF   Mucus PRESENT   Urine rapid drug screen (hosp performed)     Status: Abnormal   Collection Time: 07/10/23  3:55 PM  Result Value Ref Range   Opiates NONE DETECTED NONE DETECTED   Cocaine POSITIVE (A) NONE DETECTED   Benzodiazepines NONE DETECTED NONE DETECTED   Amphetamines NONE DETECTED NONE DETECTED   Tetrahydrocannabinol NONE DETECTED NONE DETECTED   Barbiturates NONE DETECTED NONE DETECTED  Troponin I (High Sensitivity)     Status: Abnormal   Collection Time: 07/10/23  5:52 PM  Result Value Ref Range   Troponin I (High Sensitivity) 78 (H) <18 ng/L   Recent Results (from the past 720 hours)  Resp panel by RT-PCR (RSV, Flu A&B, Covid) Anterior Nasal Swab     Status: None   Collection Time: 07/10/23  3:49 PM   Specimen: Anterior Nasal Swab  Result Value Ref Range Status   SARS Coronavirus 2 by RT PCR NEGATIVE NEGATIVE Final   Influenza A by PCR NEGATIVE NEGATIVE Final   Influenza B by PCR NEGATIVE NEGATIVE Final    Comment: (NOTE) The Xpert Xpress SARS-CoV-2/FLU/RSV plus assay is intended as an aid in the diagnosis of influenza from Nasopharyngeal swab specimens and should not be used as a sole basis for treatment. Nasal washings and aspirates are unacceptable for Xpert Xpress SARS-CoV-2/FLU/RSV testing.  Fact Sheet for Patients: BloggerCourse.com  Fact Sheet for Healthcare Providers: SeriousBroker.it  This test is not yet approved or cleared by the [place] FDA and has  been authorized for detection and/or diagnosis of SARS-CoV-2 by FDA under an Emergency Use Authorization (EUA). This EUA will remain in effect (meaning this test can be used) for the duration of the COVID-19 declaration under Section 564(b)(1) of the Act, 21 U.S.C. section 360bbb-3(b)(1), unless the authorization is terminated or revoked.     Resp Syncytial Virus by PCR NEGATIVE NEGATIVE Final    Comment: (NOTE) Fact Sheet for  Patients: BloggerCourse.com  Fact Sheet for Healthcare Providers: SeriousBroker.it  This test is not yet approved or cleared by the [place] FDA and has been authorized for detection and/or diagnosis of SARS-CoV-2 by FDA under an Emergency Use Authorization (EUA). This EUA will remain in effect (meaning this test can be used) for the duration of the COVID-19 declaration under Section 564(b)(1) of the Act, 21 U.S.C. section 360bbb-3(b)(1), unless the authorization is terminated or revoked.  Performed at [facility], [address], [place], [place] [address]    CBC:    Latest Ref Rng & Units 07/10/2023    3:49 PM 06/27/2017    9:36 AM 06/27/2017    9:31 AM  CBC  WBC 4.0 - 10.5 K/uL 9.5   7.2   Hemoglobin 13.0 - 17.0 g/dL 60.4  54.0  98.1   Hematocrit 39.0 - 52.0 % 44.9  39.0  39.4   Platelets 150 - 400 K/uL 204   191    Basic Metabolic Panel: Recent Labs  Lab 07/10/23 1549  NA 137  K 5.0  CL 106  CO2 21*  GLUCOSE 119*  BUN 52*  CREATININE 1.84*  CALCIUM 8.7*   Creatinine: Lab Results  Component Value Date   CREATININE 1.84 (H) 07/10/2023   CREATININE 1.27 05/28/2023   CREATININE 1.60 (H) 06/27/2017   Liver Function Tests:    Latest Ref Rng & Units 07/10/2023    3:49 PM 06/27/2017    9:31 AM 11/23/2016    8:59 PM  Hepatic Function  Total Protein 6.5 - 8.1 g/dL 5.2  6.7  6.8   Albumin 3.5 - 5.0 g/dL 2.7  3.8  3.9   AST 15 - 41 U/L 50  18  18   ALT 0 - 44 U/L 54  10  11    Alk Phosphatase 38 - 126 U/L 116  71  75   Total Bilirubin 0.0 - 1.2 mg/dL 1.5  0.8  1.0    Coagulation Profile: No results for input(s): "INR", "PROTIME" in the last 168 hours. Cardiac Enzymes: No results for input(s): "CKTOTAL", "CKMB", "CKMBINDEX", "TROPONINI" in the last 168 hours. BNP (last 3 results) Recent Labs    05/28/23 0850  PROBNP 24,067*   HbA1C: No results for input(s): "HGBA1C" in the last 72 hours. Lipid Profile: No results for input(s): "CHOL", "HDL", "LDLCALC", "TRIG", "CHOLHDL", "LDLDIRECT" in the last 72 hours.  Radiological Exams on Admission: DG Chest Port 1 View Result Date: 07/10/2023 CLINICAL DATA:  Shortness of breath EXAM: PORTABLE CHEST 1 VIEW COMPARISON:  Chest radiograph dated 01/15/2010 FINDINGS: Patient is rotated to the right. Low lung volumes with bronchovascular crowding. Diffuse interstitial opacities. Small right pleural effusion. No pneumothorax. Enlarged cardiomediastinal silhouette. No acute osseous abnormality. IMPRESSION: 1. Low lung volumes with bronchovascular crowding. Diffuse interstitial opacities, likely pulmonary edema. 2. Small right pleural effusion. 3. Marked cardiomegaly. Electronically Signed   By: Agustin Cree M.D.   On: 07/10/2023 17:01    Data Reviewed: Relevant notes from primary care and specialist visits, past discharge summaries as available in EHR, including Care Everywhere. Prior diagnostic testing as pertinent to current admission diagnoses, Updated medications and problem lists for reconciliation ED course, including vitals, labs, imaging, treatment and response to treatment,Triage notes, nursing and pharmacy notes and ED provider's notes Notable results as noted in HPI.Discussed case with EDMD/ ED APP/ or Specialty MD on call and as needed.  Assessment & Plan Bilateral lower extremity edema Patient presenting with bilateral lower  extremity edema extending from his legs to his groin and lower abdomen anasarca picture,  elevated BNP of 4500, judging from his clinical features suspect a combination of systolic and diastolic congestive heart failure no recent echoes, last echocardiogram in our chart is in 2011 where his function was noted to be within normal limits. Lasix 40 mg bid x 2 doses.  Anxiety Will continue patient on BuSpar once med rec is available until then we will start patient on as needed Ativan.  Will obtain an ethanol level. CAD (coronary artery disease) Patient has history of heart disease, continue patient on aspirin 81 and Crestor..  Nitroglycerin. Currently patient is chest pain-free suspect elevated troponin of 91 secondary to demand ischemia. Will continue to follow. Beta-blocker avoided secondary to cocaine abuse. GERD (gastroesophageal reflux disease) PPI therapy and aspiration precaution. History of stroke History of CVA secondary to essential hypertension, polysubstance abuse and medical noncompliance. Currently patient at baseline and is nonfocal. Will DC patient on aspirin and statin therapy. Hypertension Home regimen includes clonidine, amlodipine, HCTZ. Will continue patient on clonidine to avoid rebound hypertension, amlodipine, hold HCTZ to allow room for diuretic therapy with Lasix . Strict I/O. Daily weights.  2 d echo with bubble study.  Cardiology consult  per am team.    Cigarette smoker Nicotine patch counseling once patient is stable. Cocaine abuse (HCC) Counseling and TOC consult for substance abuse resource. AKI (acute kidney injury) [facility]) Lab Results  Component Value Date   CREATININE 1.84 (H) 07/10/2023   CREATININE 1.27 05/28/2023   CREATININE 1.60 (H) 06/27/2017  Suspect secondary to hypertensive urgency and cocaine abuse.Renally dose medications avoid nephrotoxic agents and contrast studies.  Elevated LFTs Transaminitis secondary to passive hepatic congestion from CHF exacerbation. Will monitor low threshold for liver ultrasound.  DM II (diabetes  mellitus, type II), controlled (HCC) Suspect this may be a new finding based on chart review his last A1c was 6.9.  In light of his heart disease and vascular disease we will start patient on treatment with glycemic protocol and insulin as deemed appropriate based on kidney function.  New diabetes mellitus counseling.  Diabetes education.  And additional resources.  DVT prophylaxis:  Heparin  Consults:  Cardiology  Advance Care Planning:    Code Status: Full Code   Family Communication:  Daughter  Disposition Plan:  Home  Severity of Illness: The appropriate patient status for this patient is INPATIENT. Inpatient status is judged to be reasonable and necessary in order to provide the required intensity of service to ensure the patient's safety. The patient's presenting symptoms, physical exam findings, and initial radiographic and laboratory data in the context of their chronic comorbidities is felt to place them at high risk for further clinical deterioration. Furthermore, it is not anticipated that the patient will be medically stable for discharge from the hospital within 2 midnights of admission.   * I certify that at the point of admission it is my clinical judgment that the patient will require inpatient hospital care spanning beyond 2 midnights from the point of admission due to high intensity of service, high risk for further deterioration and high frequency of surveillance required.*  Author: Gertha Calkin, MD 07/10/2023 8:12 PM  For on call review [email or web address].   Unresulted Labs (From admission, onward)     Start     Ordered   07/11/23 0500  Comprehensive metabolic panel  Tomorrow morning,   R        07/10/23 2004   07/11/23  0500  CBC  Tomorrow morning,   R        07/10/23 2004   07/10/23 1911  TSH  Add-on,   AD        07/10/23 2004   07/10/23 1911  Lipid panel  Add-on,   AD        07/10/23 2004   07/10/23 1908  CBC  (heparin)  Once,   R       Comments: Baseline for  heparin therapy IF NOT ALREADY DRAWN.  Notify MD if PLT < 100 K.    07/10/23 2004   07/10/23 1908  Creatinine, serum  (heparin)  Once,   R       Comments: Baseline for heparin therapy IF NOT ALREADY DRAWN.    07/10/23 2004   07/10/23 1907  HIV Antibody (routine testing w rflx)  (HIV Antibody (Routine testing w reflex) panel)  Once,   R        07/10/23 2004            Orders Placed This Encounter  Procedures   Resp panel by RT-PCR (RSV, Flu A&B, Covid) Anterior Nasal Swab   DG Chest Port 1 View   Comprehensive metabolic panel   CBC   Brain natriuretic peptide   Urinalysis, Routine w reflex microscopic -Urine, Clean Catch   Urine rapid drug screen (hosp performed)   Ethanol   HIV Antibody (routine testing w rflx)   CBC   Creatinine, serum   TSH   Lipid panel   Comprehensive metabolic panel   CBC   Diet heart healthy/carb modified Room service appropriate? Yes; Fluid consistency: Thin; Fluid restriction: 1500 mL Fluid   ED Cardiac monitoring   Check Peak Flow   Initiate Carrier Fluid Protocol   Apply Diabetes Mellitus Care Plan   STAT CBG when hypoglycemia is suspected. If treated, recheck every 15 minutes after each treatment until CBG >/= 70 mg/dl   Refer to Hypoglycemia Protocol Sidebar Report for treatment of CBG < 70 mg/dl   No HS correction Insulin   Notify physician (specify)   Initiate Heart Failure Care Plan   Daily weights   Strict intake and output   In and Out Cath   Patient Education:   Apply Heart Failure Care Plan   Apply Diabetes Mellitus Care Plan   STAT CBG when hypoglycemia is suspected. If treated, recheck every 15 minutes after each treatment until CBG >/= 70 mg/dl   Refer to Hypoglycemia Protocol Sidebar Report for treatment of CBG < 70 mg/dl   Cardiac Monitoring Continuous x 24 hours Indications for use: Sub-acute heart failure   Maintain IV access   Vital signs   Notify physician (specify)   Mobility Protocol: No Restrictions RN to initiate  protocols based on patient's level of care   Refer to Sidebar Report Refer to ICU, Med-Surg, Progressive, and Step-Down Mobility Protocol Sidebars   Initiate Adult Central Line Maintenance and Catheter Protocol for patients with central line (CVC, PICC, Port, Hemodialysis, Trialysis)   If patient diabetic or glucose greater than 140 notify physician for Sliding Scale Insulin Orders   Do not place and if present remove PureWick   Initiate Oral Care Protocol   Initiate Carrier Fluid Protocol   RN may order General Admission PRN Orders utilizing "General Admission PRN medications" (through manage orders) for the following patient needs: allergy symptoms (Claritin), cold sores (Carmex), cough (Robitussin DM), eye irritation (Liquifilm Tears), hemorrhoids (Tucks), indigestion (Maalox), minor skin irritation (Hydrocortisone Cream),  muscle pain Crista Elliot), nose irritation (saline nasal spray) and sore throat (Chloraseptic spray).   Full code   Consult for [facility] Admission   Inpatient consult to Cardiology Consult Timeframe: ROUTINE - requires response within 24 hours; Reason for Consult? NEW CHF .   Inpatient consult to Cardiology Consult Timeframe: ROUTINE - requires response within 24 hours; Reason for Consult? CHF Already called   Consult to Transition of Care Team   Consult to Heart Failure Navigation Team [facility] and [facility])   Nutritional services consult   OT eval and treat   PT eval and treat   Pulse oximetry check with vital signs   Oxygen therapy Mode or (Route): Nasal cannula; Liters Per Minute: 2; Keep O2 saturation between: greater than 92 %   Incentive spirometry   EKG 12-Lead   ED EKG   EKG 12-Lead   ECHOCARDIOGRAM COMPLETE BUBBLE STUDY   Saline lock IV   Insert peripheral IV   Admit to Inpatient (patient's expected length of stay will be greater than 2 midnights or inpatient only procedure)   Aspiration precautions   Fall precautions

## 2023-07-10 NOTE — Assessment & Plan Note (Signed)
History of CVA secondary to essential hypertension, polysubstance abuse and medical noncompliance. Currently patient at baseline and is nonfocal. Will DC patient on aspirin and statin therapy.

## 2023-07-10 NOTE — Assessment & Plan Note (Addendum)
Will continue patient on BuSpar once med rec is available until then we will start patient on as needed Ativan.  Will obtain an ethanol level.

## 2023-07-10 NOTE — ED Provider Notes (Signed)
[place] EMERGENCY DEPARTMENT AT [facility] Provider Note   CSN: [id number] Arrival date & time: 07/10/23  1539     History  Chief Complaint  Patient presents with   Leg Swelling    [name] is a 58 y.o. male.  Pt is a 58 yo male with pmhx significant for CHF, HTN, CVA, COPD, Hep C, CAD, Bipolar d/o, hx cocaine abuse and medical noncompliance.  Pt presents to the ED today with increased swelling to his lower legs.  He also has been having increased sob.  He has not been taking his medications for about 3 or 4 weeks.         Home Medications Prior to Admission medications   Medication Sig Start Date End Date Taking? Authorizing Provider  albuterol (VENTOLIN HFA) 108 (90 Base) MCG/ACT inhaler Inhale 2 puffs into the lungs every 4 (four) hours as needed for wheezing. 04/24/23   [provider]  amLODipine (NORVASC) 10 MG tablet Take 10 mg by mouth daily. 04/10/19   [provider]  aspirin 81 MG EC tablet Chew 81 mg by mouth daily.    [provider]  busPIRone (BUSPAR) 15 MG tablet Take 7.5 mg by mouth 2 (two) times daily. 01/10/19   [provider]  cloNIDine (CATAPRES) 0.2 MG tablet Take 0.2 mg by mouth 2 (two) times daily.    [provider]  finasteride (PROSCAR) 5 MG tablet Take 5 mg by mouth daily. 01/25/21   [provider]  furosemide (LASIX) 40 MG tablet Take 1 tablet (40 mg total) by mouth daily. 05/28/23   Flossie Dibble, NP  gabapentin (NEURONTIN) 100 MG capsule Take 100 mg by mouth 3 (three) times daily as needed (PAIN).    [provider]  hydrochlorothiazide (HYDRODIURIL) 25 MG tablet Take 25 mg by mouth every morning. for blood pressure 04/09/17   [provider]  meloxicam (MOBIC) 7.5 MG tablet Take 7.5 mg by mouth daily. 06/18/17   [provider]  nitroGLYCERIN (NITROSTAT) 0.4 MG SL tablet Place 1 tablet (0.4 mg total) under the tongue every 5 (five) minutes as  needed. 03/19/21 06/17/21  Revankar, Aundra Dubin, MD  rosuvastatin (CRESTOR) 20 MG tablet Take 20 mg by mouth daily. 01/16/21   [provider]  tamsulosin (FLOMAX) 0.4 MG CAPS capsule Take 0.4 mg by mouth daily. 04/09/17   [provider]  tiZANidine (ZANAFLEX) 4 MG tablet Take 4 mg by mouth 3 (three) times daily as needed for muscle spasms. 01/17/21   [provider]  traZODone (DESYREL) 100 MG tablet Take 100 mg by mouth at bedtime. 06/18/17   [provider]      Allergies    Ace inhibitors and Penicillins    Review of Systems   Review of Systems  Respiratory:  Positive for shortness of breath.   All other systems reviewed and are negative.   Physical Exam Updated Vital Signs BP (!) 148/97   Pulse 87   Temp 98.4 F (36.9 C) (Oral)   Resp 16   SpO2 100%  Physical Exam Vitals and nursing note reviewed.  Constitutional:      Appearance: Normal appearance.  HENT:     Head: Normocephalic and atraumatic.     Right Ear: External ear normal.     Left Ear: External ear normal.     Nose: Nose normal.     Mouth/Throat:     Mouth: Mucous membranes are moist.  Pharynx: Oropharynx is clear.  Eyes:     Extraocular Movements: Extraocular movements intact.     Conjunctiva/sclera: Conjunctivae normal.     Pupils: Pupils are equal, round, and reactive to light.  Cardiovascular:     Rate and Rhythm: Normal rate and regular rhythm.     Pulses: Normal pulses.     Heart sounds: Normal heart sounds.  Pulmonary:     Effort: Tachypnea present.     Breath sounds: Rhonchi present.  Abdominal:     General: Abdomen is flat. Bowel sounds are normal.     Palpations: Abdomen is soft.  Genitourinary:    Comments: Scrotal edema Musculoskeletal:     Cervical back: Normal range of motion and neck supple.     Right lower leg: Edema present.     Left lower leg: Edema present.  Skin:    General: Skin is warm.     Capillary Refill: Capillary refill takes less than 2  seconds.  Neurological:     General: No focal deficit present.     Mental Status: He is alert and oriented to person, place, and time.  Psychiatric:        Speech: Speech is tangential.     ED Results / Procedures / Treatments   Labs (all labs ordered are listed, but only abnormal results are displayed) Labs Reviewed  COMPREHENSIVE METABOLIC PANEL - Abnormal; Notable for the following components:      Result Value   CO2 21 (*)    Glucose, Bld 119 (*)    BUN 52 (*)    Creatinine, Ser 1.84 (*)    Calcium 8.7 (*)    Total Protein 5.2 (*)    Albumin 2.7 (*)    AST 50 (*)    ALT 54 (*)    Total Bilirubin 1.5 (*)    GFR, Estimated 42 (*)    All other components within normal limits  CBC - Abnormal; Notable for the following components:   RDW 16.6 (*)    All other components within normal limits  BRAIN NATRIURETIC PEPTIDE - Abnormal; Notable for the following components:   B Natriuretic Peptide >4,500.0 (*)    All other components within normal limits  URINALYSIS, ROUTINE W REFLEX MICROSCOPIC - Abnormal; Notable for the following components:   Hgb urine dipstick SMALL (*)    Protein, ur 100 (*)    All other components within normal limits  RAPID URINE DRUG SCREEN, HOSP PERFORMED - Abnormal; Notable for the following components:   Cocaine POSITIVE (*)    All other components within normal limits  TROPONIN I (HIGH SENSITIVITY) - Abnormal; Notable for the following components:   Troponin I (High Sensitivity) 91 (*)    All other components within normal limits  RESP PANEL BY RT-PCR (RSV, FLU A&B, COVID)  [id number]  TROPONIN I (HIGH SENSITIVITY)    EKG EKG Interpretation Date/Time:  Saturday July 10 2023 15:46:09 EST Ventricular Rate:  78 PR Interval:  131 QRS Duration:  99 QT Interval:  376 QTC Calculation: 429 R Axis:   42  Text Interpretation: Sinus rhythm LVH with secondary repolarization abnormality Anterior Q waves, possibly due to LVH No significant change since  last tracing Confirmed by Jacalyn Lefevre [phone]) on 07/10/2023 5:30:15 PM  Radiology DG Chest Port 1 View Result Date: 07/10/2023 CLINICAL DATA:  Shortness of breath EXAM: PORTABLE CHEST 1 VIEW COMPARISON:  Chest radiograph dated 01/15/2010 FINDINGS: Patient is rotated to the right. Low lung volumes with bronchovascular crowding.  Diffuse interstitial opacities. Small right pleural effusion. No pneumothorax. Enlarged cardiomediastinal silhouette. No acute osseous abnormality. IMPRESSION: 1. Low lung volumes with bronchovascular crowding. Diffuse interstitial opacities, likely pulmonary edema. 2. Small right pleural effusion. 3. Marked cardiomegaly. Electronically Signed   By: Agustin Cree M.D.   On: 07/10/2023 17:01    Procedures Procedures    Medications Ordered in ED Medications  furosemide (LASIX) injection 40 mg (40 mg Intravenous Given 07/10/23 1604)  hydrALAZINE (APRESOLINE) injection 10 mg (10 mg Intravenous Given 07/10/23 1603)  amLODipine (NORVASC) tablet 10 mg (10 mg Oral Given 07/10/23 1743)  cloNIDine (CATAPRES) tablet 0.2 mg (0.2 mg Oral Given 07/10/23 1743)  hydrALAZINE (APRESOLINE) injection 20 mg (20 mg Intravenous Given 07/10/23 1747)    ED Course/ Medical Decision Making/ A&P                                 Medical Decision Making Amount and/or Complexity of Data Reviewed Labs: ordered. Radiology: ordered.  Risk Prescription drug management. Decision regarding hospitalization.   This patient presents to the ED for concern of sob, leg swelling, this involves an extensive number of treatment options, and is a complaint that carries with it a high risk of complications and morbidity.  The differential diagnosis includes chf exacerbation, electrolyte abn, anemia   Co morbidities that complicate the patient evaluation  CHF, HTN, CVA, COPD, Hep C, CAD, Bipolar d/o, hx cocaine abuse and medical noncompliance   Additional history obtained:  Additional history obtained  from epic chart review External records from outside source obtained and reviewed including EMS report/daughter   Lab Tests:  I Ordered, and personally interpreted labs.  The pertinent results include:  cbc nl, cmp with bun 52 and cr 1.84 (bun 32 and cr 1.27 12/13); lfts sl elevated (ast 50 and alt 54), tb 1.5; bnp >4500; trop elevated at 91, uds + cocaine   Imaging Studies ordered:  I ordered imaging studies including cxr  I independently visualized and interpreted imaging which showed  Low lung volumes with bronchovascular crowding. Diffuse  interstitial opacities, likely pulmonary edema.  2. Small right pleural effusion.  3. Marked cardiomegaly.   I agree with the radiologist interpretation   Cardiac Monitoring:  The patient was maintained on a cardiac monitor.  I personally viewed and interpreted the cardiac monitored which showed an underlying rhythm of: nsr   Medicines ordered and prescription drug management:  I ordered medication including lasix/hydralazine  for sx  Reevaluation of the patient after these medicines showed that the patient improved I have reviewed the patients home medicines and have made adjustments as needed   Critical Interventions:  lasix   Consultations Obtained:  I requested consultation with the hospitalist (Dr. Renaldo Reel),  and discussed lab and imaging findings as well as pertinent plan - she will admit   Problem List / ED Course:  CHF exacerbation:  iv lasix given.  Pt is diuresing. HTN urgency:  pt has not been taking his bp meds.  IV hydralazine given (avoiding bb due to cocaine).  Home bp meds given and bp has improved. Elevated trop:  no cp.  Likely due to wob, chf, and htn and cocaine Cocaine abuse:  chronic AKI:  likely multifactorial from poor oral intake, uncontrolled htn, cocaine   Reevaluation:  After the interventions noted above, I reevaluated the patient and found that they have :improved   Social Determinants of  Health:  Lives at home, noncompliant   Dispostion:  After consideration of the diagnostic results and the patients response to treatment, I feel that the patent would benefit from admission.    CRITICAL CARE Performed by: Jacalyn Lefevre   Total critical care time: 30 minutes  Critical care time was exclusive of separately billable procedures and treating other patients.  Critical care was necessary to treat or prevent imminent or life-threatening deterioration.  Critical care was time spent personally by me on the following activities: development of treatment plan with patient and/or surrogate as well as nursing, discussions with consultants, evaluation of patient's response to treatment, examination of patient, obtaining history from patient or surrogate, ordering and performing treatments and interventions, ordering and review of laboratory studies, ordering and review of radiographic studies, pulse oximetry and re-evaluation of patient's condition.         Final Clinical Impression(s) / ED Diagnoses Final diagnoses:  Acute on chronic congestive heart failure, unspecified heart failure type (HCC)  Peripheral edema  Hypertensive urgency  Cocaine abuse (HCC)  Medically noncompliant  AKI (acute kidney injury) [employer])    Rx / DC Orders ED Discharge Orders     None         Jacalyn Lefevre, MD 07/10/23 [phone]

## 2023-07-10 NOTE — Assessment & Plan Note (Addendum)
History of CVA secondary to essential hypertension, polysubstance abuse and medical noncompliance. Currently patient at baseline and is nonfocal. Will DC patient on aspirin and statin therapy.

## 2023-07-10 NOTE — ED Triage Notes (Signed)
Bilateral leg edema extending to scrotum x 2 months. Hx of CHF and on Lasix but pt is noncompliant with CHF and BP meds.   EMS VS: 170/110 78 HR 98% on RA 120 CBG

## 2023-07-11 ENCOUNTER — Other Ambulatory Visit: Payer: Self-pay

## 2023-07-11 DIAGNOSIS — I10 Essential (primary) hypertension: Secondary | ICD-10-CM | POA: Diagnosis not present

## 2023-07-11 DIAGNOSIS — I5043 Acute on chronic combined systolic (congestive) and diastolic (congestive) heart failure: Secondary | ICD-10-CM

## 2023-07-11 DIAGNOSIS — I5033 Acute on chronic diastolic (congestive) heart failure: Secondary | ICD-10-CM | POA: Diagnosis not present

## 2023-07-11 DIAGNOSIS — F419 Anxiety disorder, unspecified: Secondary | ICD-10-CM

## 2023-07-11 DIAGNOSIS — I5023 Acute on chronic systolic (congestive) heart failure: Secondary | ICD-10-CM | POA: Diagnosis not present

## 2023-07-11 DIAGNOSIS — F17210 Nicotine dependence, cigarettes, uncomplicated: Secondary | ICD-10-CM

## 2023-07-11 DIAGNOSIS — I2510 Atherosclerotic heart disease of native coronary artery without angina pectoris: Secondary | ICD-10-CM

## 2023-07-11 DIAGNOSIS — N179 Acute kidney failure, unspecified: Secondary | ICD-10-CM

## 2023-07-11 HISTORY — DX: Acute on chronic combined systolic (congestive) and diastolic (congestive) heart failure: I50.43

## 2023-07-11 LAB — CBC
HCT: 45.8 % (ref 39.0–52.0)
Hemoglobin: 15.2 g/dL (ref 13.0–17.0)
MCH: 31 pg (ref 26.0–34.0)
MCHC: 33.2 g/dL (ref 30.0–36.0)
MCV: 93.3 fL (ref 80.0–100.0)
Platelets: 193 10*3/uL (ref 150–400)
RBC: 4.91 MIL/uL (ref 4.22–5.81)
RDW: 16.8 % — ABNORMAL HIGH (ref 11.5–15.5)
WBC: 9.9 10*3/uL (ref 4.0–10.5)
nRBC: 0 % (ref 0.0–0.2)

## 2023-07-11 LAB — CBG MONITORING, ED
Glucose-Capillary: 171 mg/dL — ABNORMAL HIGH (ref 70–99)
Glucose-Capillary: 157 mg/dL — ABNORMAL HIGH (ref 70–99)
Glucose-Capillary: 201 mg/dL — ABNORMAL HIGH (ref 70–99)

## 2023-07-11 LAB — COMPREHENSIVE METABOLIC PANEL
ALT: 48 U/L — ABNORMAL HIGH (ref 0–44)
AST: 39 U/L (ref 15–41)
Albumin: 2.7 g/dL — ABNORMAL LOW (ref 3.5–5.0)
Alkaline Phosphatase: 115 U/L (ref 38–126)
Anion gap: 9 (ref 5–15)
BUN: 43 mg/dL — ABNORMAL HIGH (ref 6–20)
CO2: 21 mmol/L — ABNORMAL LOW (ref 22–32)
Calcium: 8.4 mg/dL — ABNORMAL LOW (ref 8.9–10.3)
Chloride: 108 mmol/L (ref 98–111)
Creatinine, Ser: 1.85 mg/dL — ABNORMAL HIGH (ref 0.61–1.24)
GFR, Estimated: 42 mL/min — ABNORMAL LOW (ref >60)
Glucose, Bld: 160 mg/dL — ABNORMAL HIGH (ref 70–99)
Potassium: 3.6 mmol/L (ref 3.5–5.1)
Sodium: 138 mmol/L (ref 135–145)
Total Bilirubin: 1.3 mg/dL — ABNORMAL HIGH (ref 0.0–1.2)
Total Protein: 5.5 g/dL — ABNORMAL LOW (ref 6.5–8.1)

## 2023-07-11 LAB — GLUCOSE, CAPILLARY
Glucose-Capillary: 187 mg/dL — ABNORMAL HIGH (ref 70–99)
Glucose-Capillary: 158 mg/dL — ABNORMAL HIGH (ref 70–99)

## 2023-07-11 LAB — MRSA NEXT GEN BY PCR, NASAL: MRSA by PCR Next Gen: NOT DETECTED

## 2023-07-11 MED ORDER — POTASSIUM CHLORIDE CRYS ER 20 MEQ PO TBCR
20.0000 meq | EXTENDED_RELEASE_TABLET | Freq: Once | ORAL | Status: AC
Start: 2023-07-11 — End: 2023-07-11
  Administered 2023-07-11: 20 meq via ORAL
  Filled 2023-07-11: qty 1

## 2023-07-11 MED ORDER — ASPIRIN 81 MG PO CHEW
81.0000 mg | CHEWABLE_TABLET | Freq: Every day | ORAL | Status: DC
  Administered 2023-07-11 – 2023-07-22 (×12): 81 mg via ORAL
  Filled 2023-07-11 (×12): qty 1

## 2023-07-11 MED ORDER — HYDRALAZINE HCL 50 MG PO TABS
50.0000 mg | ORAL_TABLET | Freq: Three times a day (TID) | ORAL | Status: DC
  Administered 2023-07-11 – 2023-07-12 (×3): 50 mg via ORAL
  Filled 2023-07-11 (×3): qty 1

## 2023-07-11 MED ORDER — ROSUVASTATIN CALCIUM 20 MG PO TABS
40.0000 mg | ORAL_TABLET | Freq: Every day | ORAL | Status: DC
  Administered 2023-07-11 – 2023-07-22 (×12): 40 mg via ORAL
  Filled 2023-07-11 (×13): qty 2

## 2023-07-11 NOTE — Assessment & Plan Note (Signed)
Continue aggressive diuresis, and hydralazine Holding clonidine due to rebound effects when non adherence.  Continue blood pressure monitoring.

## 2023-07-11 NOTE — Assessment & Plan Note (Signed)
Nicotine patch counseling once patient is stable.

## 2023-07-11 NOTE — Assessment & Plan Note (Signed)
Continue buspirone.

## 2023-07-11 NOTE — Assessment & Plan Note (Signed)
History of CVA secondary to essential hypertension, polysubstance abuse and medical noncompliance. Currently patient at baseline and is nonfocal.

## 2023-07-11 NOTE — Assessment & Plan Note (Signed)
Counseling and TOC consult for substance abuse resource.

## 2023-07-11 NOTE — Progress Notes (Signed)
Rounding Note    Patient Name: [name] Date of Encounter: 07/11/2023  [facility] Cardiologist: Dr Tomie China  Subjective   Denies CP or dypsnea  Inpatient Medications    Scheduled Meds:  heparin  5,000 Units Subcutaneous Q8H   insulin aspart  0-15 Units Subcutaneous TID WC   nicotine  14 mg Transdermal Daily   pantoprazole (PROTONIX) IV  40 mg Intravenous Q24H   sodium chloride flush  3 mL Intravenous Q12H   Continuous Infusions:  PRN Meds: acetaminophen **OR** acetaminophen, hydrALAZINE, nitroGLYCERIN, ondansetron **OR** ondansetron (ZOFRAN) IV   Vital Signs    Vitals:   07/11/23 1000 07/11/23 1100 07/11/23 1135 07/11/23 1200  BP: (!) 150/101 137/66  (!) 155/103  Pulse: 69   68  Resp: 15 18  20   Temp:   97.9 F (36.6 C)   TempSrc:   Oral   SpO2: 96%   98%    Intake/Output Summary (Last 24 hours) at 07/11/2023 1339 Last data filed at 07/11/2023 1331 Gross per 24 hour  Intake --  Output 6385 ml  Net -6385 ml      05/28/2023    7:55 AM 03/19/2021    8:32 AM 05/02/2019    8:03 AM  Last 3 Weights  Weight (lbs) 163 lb 219 lb 219 lb  Weight (kg) 73.936 kg 99.338 kg 99.338 kg      Telemetry    Sinus with runs of NSVT and PVCs - Personally Reviewed   Physical Exam   GEN: No acute distress.   Neck: supple Cardiac: RRR, no murmurs, rubs, or gallops.  Respiratory: Clear to auscultation bilaterally. GI: Soft, nontender, non-distended  MS: 4+ edema Neuro:  Nonfocal  Psych: Normal affect   Labs    High Sensitivity Troponin:   Recent Labs  Lab 07/10/23 1549 07/10/23 1752  TROPONINIHS 91* 78*     Chemistry Recent Labs  Lab 07/10/23 1549 07/10/23 2036 07/11/23 0449  NA 137  --  138  K 5.0  --  3.6  CL 106  --  108  CO2 21*  --  21*  GLUCOSE 119*  --  160*  BUN 52*  --  43*  CREATININE 1.84* 1.80* 1.85*  CALCIUM 8.7*  --  8.4*  PROT 5.2*  --  5.5*  ALBUMIN 2.7*  --  2.7*  AST 50*  --  39  ALT 54*  --  48*  ALKPHOS 116   --  115  BILITOT 1.5*  --  1.3*  GFRNONAA 42* 43* 42*  ANIONGAP 10  --  9    Lipids  Recent Labs  Lab 07/10/23 2036  CHOL 218*  TRIG 108  HDL 44  LDLCALC 152*  CHOLHDL 5.0    Hematology Recent Labs  Lab 07/10/23 1549 07/10/23 2036 07/11/23 0449  WBC 9.5 9.9 9.9  RBC 4.79 5.16 4.91  HGB 14.8 16.1 15.2  HCT 44.9 47.9 45.8  MCV 93.7 92.8 93.3  MCH 30.9 31.2 31.0  MCHC 33.0 33.6 33.2  RDW 16.6* 16.8* 16.8*  PLT 204 232 193   Thyroid  Recent Labs  Lab 07/10/23 2036  TSH 4.268    BNP Recent Labs  Lab 07/10/23 1549  BNP >4,500.0*      Radiology    DG Chest Port 1 View Result Date: 07/10/2023 CLINICAL DATA:  Shortness of breath EXAM: PORTABLE CHEST 1 VIEW COMPARISON:  Chest radiograph dated 01/15/2010 FINDINGS: Patient is rotated to the right. Low lung volumes with  bronchovascular crowding. Diffuse interstitial opacities. Small right pleural effusion. No pneumothorax. Enlarged cardiomediastinal silhouette. No acute osseous abnormality. IMPRESSION: 1. Low lung volumes with bronchovascular crowding. Diffuse interstitial opacities, likely pulmonary edema. 2. Small right pleural effusion. 3. Marked cardiomegaly. Electronically Signed   By: Agustin Cree M.D.   On: 07/10/2023 17:01    Patient Profile     58 y.o. male with PMH of CAD, bipolar, CVA, substance abuse, hypertension, noncompliance for evaluation of acute on chronic diastolic CHF. Seen recently in the office and echo ordered but not performed.  Assessment & Plan    1 Acute on chronic systolic CHF-pt has been noncompliant with meds. Significantly volume overloaded. Continue lasix 40 mg BID and follow renal function. Will consider addition of SGLT2I and spironolactone as outpt if pt compliant with FU and meds. Check echo.  2 hypertension-BP elevated; would avoid clonidine as outpt due to noncompliance and risk of rebound hypertension if he misses doses; will continue amlodipine (may need to DC pending echo results);  add hydralazine 50 mg tid. Add nitrates if LV function reduced. Will avoid ARB or entresto for now due to renal insuff. If LV function reduced, add coreg.  3 acute on chronic stage 3a kidney disease-follow renal function with diuresis.  4 cocaine abuse-needs to avoid.   5 elevated troponin-minimal; no plans for ischemia eval at this point. He would need to demonstrate compliance with meds and FU prior to procedures.  6 H/O CAD-add ASA 81 mg daily and statin.  For questions or updates, please contact [facility] Please consult [email or web address] for contact info under        Signed, Olga Millers, MD  07/11/2023, 1:39 PM

## 2023-07-11 NOTE — Assessment & Plan Note (Signed)
Continue aspirin and statin  No chest pain.  High sensitive troponin elevation due to heart failure, ruled out acute coronary syndrome.

## 2023-07-11 NOTE — Progress Notes (Addendum)
I went into patient's room to check his vital signs. As soon as I got to his room he requested to for extra drink and food to eat. I was informed by previous nurse Maralyn Sago that this patient has been eating extra food and drink and has been told by the kitchen that he has exceeded his daily intake. Patient pleaded with me beginning of the shift that he was still hungry and needed to eat. I made a deal with him that if he eats extra he will not be fed and more food and drink for overnight .I also educated him that he is here for fluid overload so he has to limit his salt and fluid intake. He agreed only for him to tell me he wants extra food at 23:10. 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 He also informed me his medicare pays me as a nurse so I have to do as he says. Charge Nurse informed patient is requesting for a different nurse.

## 2023-07-11 NOTE — Assessment & Plan Note (Signed)
Continue glucose cover and monitoring with insulin sliding scale. Patient is tolerating po well.

## 2023-07-11 NOTE — Evaluation (Signed)
Physical Therapy Evaluation Patient Details Name: [name] MRN: [id number] DOB: 05/13/1966 Today's Date: 07/11/2023  History of Present Illness  58 y.o. male presents to [facility] 07/10/23 with B LE edema and SOB. Suspect edema 2/2 systolic and diastolic CHF. Pt also with acute on chronic kidney disease. Labs positive for cocaine. PMHx: HTN, MI s/p stent, CVA, cocaine abuse, CAD   Clinical Impression  Pt in stretcher upon arrival and agreeable to PT eval. Prior to admit, pt was ModI with SP cane and needed assistance for LB dressing and bathing from family members and girlfriend. In today's session, pt was able to stand and ambulate in the room with supervision and no AD. Pt prefers UE support for longer distances and was able to ambulate ~250 ft with RW and supervision for safety. Pt was occasionally impulsive and needed cues to decrease gait speed for safety. Pt would benefit from continued acute PT to ensure safety with mobility and with stair negotiation. Anticipating no PT follow-up needs as pt reports being at baseline for mobility. Acute PT to follow.          If plan is discharge home, recommend the following: A little help with walking and/or transfers;A little help with bathing/dressing/bathroom;Help with stairs or ramp for entrance;Assist for transportation   Can travel by private vehicle    Yes    Equipment Recommendations None recommended by PT     Functional Status Assessment Patient has had a recent decline in their functional status and demonstrates the ability to make significant improvements in function in a reasonable and predictable amount of time.     Precautions / Restrictions Precautions Precautions: Fall Restrictions Weight Bearing Restrictions Per Provider Order: No      Mobility  Bed Mobility Overal bed mobility: Modified Independent     Transfers Overall transfer level: Needs assistance Equipment used: None Transfers: Sit to/from Stand Sit to Stand:  Supervision     General transfer comment: supervision for safety    Ambulation/Gait Ambulation/Gait assistance: Supervision Gait Distance (Feet): 250 Feet Assistive device: Rolling walker (2 wheels), None Gait Pattern/deviations: Step-through pattern   Gait velocity interpretation: >2.62 ft/sec, indicative of community ambulatory   General Gait Details: able to ambulate with no AD, however, increased stability with RW. Cues to decrease gait speed for safety      Balance Overall balance assessment: Needs assistance, Mild deficits observed, not formally tested Sitting-balance support: Feet supported, No upper extremity supported Sitting balance-Leahy Scale: Good     Standing balance support: No upper extremity supported Standing balance-Leahy Scale: Good Standing balance comment: able to stand and ambulate short distances with no AD       Pertinent Vitals/Pain Pain Assessment Pain Assessment: No/denies pain    Home Living Family/patient expects to be discharged to:: Private residence Living Arrangements: Spouse/significant other;Children Available Help at Discharge: Family;Available 24 hours/day Type of Home: House Home Access: Stairs to enter Entrance Stairs-Rails: Right;Left;Can reach both Entrance Stairs-Number of Steps: 3   Home Layout: One level Home Equipment: Agricultural consultant (2 wheels);Cane - single point;BSC/3in1;Wheelchair - Manufacturing systems engineer - built in      Prior Function Prior Level of Function : Driving;Needs assist        Mobility Comments: ModI w/ SP cane for short distances ADLs Comments: help from family with LB dressing, bathing, sponge bath at baseline     Extremity/Trunk Assessment   Upper Extremity Assessment Upper Extremity Assessment: Defer to OT evaluation    Lower Extremity Assessment Lower Extremity  Assessment: Overall WFL for tasks assessed    Cervical / Trunk Assessment Cervical / Trunk Assessment: Normal  Communication    Communication Communication: No apparent difficulties Cueing Techniques: Verbal cues  Cognition Arousal: Alert Behavior During Therapy: Impulsive Overall Cognitive Status: No family/caregiver present to determine baseline cognitive functioning    General Comments: A&Ox4, impulsive with moving quickly and decreased safety awareness        General Comments General comments (skin integrity, edema, etc.): VSS on RA, B LE edema     PT Assessment Patient needs continued PT services  PT Problem List Decreased activity tolerance;Decreased balance;Decreased mobility;Decreased safety awareness       PT Treatment Interventions DME instruction;Gait training;Stair training;Functional mobility training;Therapeutic activities;Therapeutic exercise;Balance training;Neuromuscular re-education;Patient/family education    PT Goals (Current goals can be found in the Care Plan section)  Acute Rehab PT Goals Patient Stated Goal: to go home PT Goal Formulation: With patient Time For Goal Achievement: 07/25/23 Potential to Achieve Goals: Good    Frequency Min 1X/week        AM-PAC PT "6 Clicks" Mobility  Outcome Measure Help needed turning from your back to your side while in a flat bed without using bedrails?: None Help needed moving from lying on your back to sitting on the side of a flat bed without using bedrails?: None Help needed moving to and from a bed to a chair (including a wheelchair)?: A Little Help needed standing up from a chair using your arms (e.g., wheelchair or bedside chair)?: A Little Help needed to walk in hospital room?: A Little Help needed climbing 3-5 steps with a railing? : A Little 6 Click Score: 20    End of Session   Activity Tolerance: Patient tolerated treatment well Patient left: in bed;with call bell/phone within reach Nurse Communication: Mobility status PT Visit Diagnosis: Unsteadiness on feet (R26.81)    Time: 1610-9604 PT Time Calculation (min) (ACUTE  ONLY): 12 min   Charges:   PT Evaluation $PT Eval Low Complexity: 1 Low   PT General Charges $$ ACUTE PT VISIT: 1 Visit        Hilton Cork, PT, DPT Secure Chat Preferred  Rehab Office [phone]  Arturo Morton Brion [facility] 07/11/2023, 1:11 PM

## 2023-07-11 NOTE — Progress Notes (Signed)
Assumed care of patient.  Patient upset that he isnt allowed to eat.  No caloric restrictions noted.  Patient given low sodium snack.  Discussed monitoring fluid intake and output.  Patient states that his swelling is down some in his legs and groin.  Will continue to educate patient as needed.

## 2023-07-11 NOTE — Hospital Course (Signed)
Mr. Brendle was admitted to the hospital with the working diagnosis of heart failure exacerbation in the setting of cocaine intoxication.   58 yo male with the past medical history of cocaine abuse, hypertension, coronary artery disease, CVA, T2DM and bipolar disorder who presented with lower extremity edema. Apparently not adherent to medical therapy or outpatient follow up. On his initial physical examination his blood pressure was 178/130, HR 92, RR 20 and 02 saturation 100%, lungs with no wheezing or rhonchi, heart with S1 and S2 present and regular with no gallops, rubs or murmurs, abdomen with no distention and positive lower extremity edema.   Na 137, K 5,0 Cl 106 bicarbonate 21, glucose 119, bun 52 cr 1,85  AST 50 ALT 54 T Bil 1,5  BNP >4,500  High sensitive troponin 91 and 78  Wbc 9,5 hgb 14,8 plt 204  Sars covid 19 negative  Influenza negative   Urine analysis SG 1,014, protein 100, negative leukocytes and negative hgb.   Toxicology screen positive for cocaine   Chest radiograph with cardiomegaly with bilateral hilar vascular congestion, bilateral central interstitial infiltrates with cephalization in the vasculature and fluid in the right fissure.   EKG 78 bpm, normal axis, normal intervals qtc 429, sinus rhythm with poor R R wave progression, ST depression and negative T wave V5 and V6 with positive LVH.   01/27 responding to diuresis but continue very volume overloaded.  01/28 continue diuresis, starting on guideline directed medical therapy.

## 2023-07-11 NOTE — Plan of Care (Signed)
Problem: Education: Goal: Ability to demonstrate management of disease process will improve Outcome: Progressing Goal: Ability to verbalize understanding of medication therapies will improve Outcome: Progressing   Problem: Activity: Goal: Capacity to carry out activities will improve Outcome: Progressing

## 2023-07-11 NOTE — Assessment & Plan Note (Signed)
Serum cr at 1,85 Plan to continue diuresis with furosemide.  Follow up renal function and electrolytes in am.

## 2023-07-11 NOTE — Progress Notes (Signed)
Progress Note   Patient: [name] ZOX:[id number] DOB: 08/06/1965 DOA: 07/10/2023     1 DOS: the patient was seen and examined on 07/11/2023   Brief hospital course: Mr. [name] was admitted to the hospital with the working diagnosis of heart failure exacerbation in the setting of cocaine intoxication.   58 yo male with the past medical history of cocaine abuse, hypertension, coronary artery disease, CVA, T2DM and bipolar disorder who presented with lower extremity edema. Apparently not adherent to medical therapy or outpatient follow up. On his initial physical examination his blood pressure was 178/130, HR 92, RR 20 and 02 saturation 100%, lungs with no wheezing or rhonchi, heart with S1 and S2 present and regular with no gallops, rubs or murmurs, abdomen with no distention and positive lower extremity edema.   Na 137, K 5,0 Cl 106 bicarbonate 21, glucose 119, bun 52 cr 1,85  AST 50 ALT 54 T Bil 1,5  BNP >4,500  High sensitive troponin 91 and 78  Wbc 9,5 hgb 14,8 plt 204  Sars covid 19 negative  Influenza negative   Urine analysis SG 1,014, protein 100, negative leukocytes and negative hgb.   Toxicology screen positive for cocaine   Chest radiograph with cardiomegaly with bilateral hilar vascular congestion, bilateral central interstitial infiltrates with cephalization in the vasculature and fluid in the right fissure.   EKG 78 bpm, normal axis, normal intervals qtc 429, sinus rhythm with poor R R wave progression, ST depression and negative T wave V5 and V6 with positive LVH.      Assessment and Plan: * Acute on chronic systolic CHF (congestive heart failure) (HCC) Continue volume overloaded.   Plan to continue diuresis with furosemide IV 40 mg bid After load reduction with hydralazine, possible addition of nitrates.  No RAAS inhibition due to acutely reduced GFR.  Follow up on echocardiogram.     CAD (coronary artery disease) Continue aspirin and statin  No chest  pain.  High sensitive troponin elevation due to heart failure, ruled out acute coronary syndrome.   Hypertension Continue aggressive diuresis, and hydralazine Holding clonidine due to rebound effects when non adherence.  Continue blood pressure monitoring.     AKI (acute kidney injury) (HCC) Serum cr at 1,85 Plan to continue diuresis with furosemide.  Follow up renal function and electrolytes in am.   DM II (diabetes mellitus, type II), controlled (HCC) Continue glucose cover and monitoring with insulin sliding scale.  Patient is tolerating po well.   Bilateral lower extremity edema Patient presenting with bilateral lower extremity edema extending from his legs to his groin and lower abdomen anasarca picture, elevated BNP of 4500, judging from his clinical features suspect a combination of systolic and diastolic congestive heart failure no recent echoes, last echocardiogram in our chart is in 2011 where his function was noted to be within normal limits. Lasix 40 mg bid x 2 doses.   Elevated LFTs Transaminitis secondary to passive hepatic congestion from CHF exacerbation. Will monitor low threshold for liver ultrasound.   Cocaine abuse (HCC) Counseling and TOC consult for substance abuse resource.  Cigarette smoker Nicotine patch counseling once patient is stable.  History of stroke History of CVA secondary to essential hypertension, polysubstance abuse and medical noncompliance. Currently patient at baseline and is nonfocal.   GERD (gastroesophageal reflux disease) Continue pantoprazole.   CVA (cerebral vascular accident) [facility]) History of CVA secondary to essential hypertension, polysubstance abuse and medical noncompliance. Currently patient at baseline and is nonfocal. Will DC  patient on aspirin and statin therapy.  Anxiety Continue buspirone.         Subjective: patient with improvement in dyspnea, but continue to have lower extremity edema, no chest pain    Physical Exam: Vitals:   07/11/23 1418 07/11/23 1430 07/11/23 1445 07/11/23 1500  BP: (!) 155/105   (!) 165/101  Pulse:  (!) 145 77 (!) 55  Resp:  (!) 21 20 (!) 24  Temp:      TempSrc:      SpO2:  100% 100% 96%   Neurology awake and alert ENT with mild pallor Cardiovascular with S1 and S2 present and regular with no gallops, rubs or murmurs Positive JVD Positive lower extremity edema +++ pitting Respiratory with no rhonchi or wheezing, positive rales at bases  Abdomen with no distention  Data Reviewed:    Family Communication: no family at the bedside   Disposition: Status is: Inpatient Remains inpatient appropriate because: heart failure   Planned Discharge Destination: Home     Author: Coralie Keens, MD 07/11/2023 3:21 PM  For on call review [email or web address].

## 2023-07-11 NOTE — Assessment & Plan Note (Signed)
Continue pantoprazole.

## 2023-07-11 NOTE — Plan of Care (Signed)
Problem: Education: Goal: Ability to describe self-care measures that may prevent or decrease complications (Diabetes Survival Skills Education) will improve Outcome: Progressing   Problem: Coping: Goal: Ability to adjust to condition or change in health will improve Outcome: Progressing   Problem: Fluid Volume: Goal: Ability to maintain a balanced intake and output will improve Outcome: Progressing

## 2023-07-11 NOTE — Assessment & Plan Note (Signed)
Echocardiogram with reduced LV systolic function 30%, LV with moderate to severe reduction in systolic function, diffuse hypokinesis, akinesis of the basal to mid infero lateral and antero lateral wall. Moderate LV cavity dilatation and mild LVH. RV systolic function low normal, RV with moderate enlargement, RVSP 59,9 mmHg.  LA and RA with severe dilatation. Moderate to severe MR, mild to moderate TR, trivial pericardial effusion,   Urine output is 4,500 ml Systolic blood pressure  140 mmHg range   Plan to continue diuresis with furosemide IV 40 mg bid After load reduction with hydralazine, and isosorbide.   No RAAS inhibition due to acutely reduced GFR.

## 2023-07-12 ENCOUNTER — Encounter (HOSPITAL_COMMUNITY): Payer: Self-pay | Admitting: Internal Medicine

## 2023-07-12 ENCOUNTER — Inpatient Hospital Stay (HOSPITAL_COMMUNITY): Payer: 59

## 2023-07-12 DIAGNOSIS — E43 Unspecified severe protein-calorie malnutrition: Secondary | ICD-10-CM

## 2023-07-12 DIAGNOSIS — I5033 Acute on chronic diastolic (congestive) heart failure: Secondary | ICD-10-CM | POA: Diagnosis not present

## 2023-07-12 DIAGNOSIS — N183 Chronic kidney disease, stage 3 unspecified: Secondary | ICD-10-CM

## 2023-07-12 DIAGNOSIS — I10 Essential (primary) hypertension: Secondary | ICD-10-CM | POA: Diagnosis not present

## 2023-07-12 DIAGNOSIS — F1410 Cocaine abuse, uncomplicated: Secondary | ICD-10-CM

## 2023-07-12 DIAGNOSIS — I5023 Acute on chronic systolic (congestive) heart failure: Secondary | ICD-10-CM | POA: Diagnosis not present

## 2023-07-12 DIAGNOSIS — K219 Gastro-esophageal reflux disease without esophagitis: Secondary | ICD-10-CM

## 2023-07-12 DIAGNOSIS — Z8673 Personal history of transient ischemic attack (TIA), and cerebral infarction without residual deficits: Secondary | ICD-10-CM

## 2023-07-12 DIAGNOSIS — E1122 Type 2 diabetes mellitus with diabetic chronic kidney disease: Secondary | ICD-10-CM

## 2023-07-12 DIAGNOSIS — I2510 Atherosclerotic heart disease of native coronary artery without angina pectoris: Secondary | ICD-10-CM | POA: Diagnosis not present

## 2023-07-12 DIAGNOSIS — N179 Acute kidney failure, unspecified: Secondary | ICD-10-CM | POA: Diagnosis not present

## 2023-07-12 HISTORY — DX: Unspecified severe protein-calorie malnutrition: E43

## 2023-07-12 LAB — ECHOCARDIOGRAM COMPLETE BUBBLE STUDY
AR max vel: 2.9 cm2
AV Area VTI: 2.88 cm2
AV Area mean vel: 2.68 cm2
AV Mean grad: 2.5 mm[Hg]
AV Peak grad: 5.5 mm[Hg]
Ao pk vel: 1.18 m/s
Est EF: 30
S' Lateral: 6 cm
Single Plane A2C EF: 37.1 %

## 2023-07-12 LAB — BASIC METABOLIC PANEL
Anion gap: 9 (ref 5–15)
BUN: 38 mg/dL — ABNORMAL HIGH (ref 6–20)
CO2: 22 mmol/L (ref 22–32)
Calcium: 8.2 mg/dL — ABNORMAL LOW (ref 8.9–10.3)
Chloride: 104 mmol/L (ref 98–111)
Creatinine, Ser: 1.82 mg/dL — ABNORMAL HIGH (ref 0.61–1.24)
GFR, Estimated: 43 mL/min — ABNORMAL LOW (ref >60)
Glucose, Bld: 161 mg/dL — ABNORMAL HIGH (ref 70–99)
Potassium: 3.7 mmol/L (ref 3.5–5.1)
Sodium: 135 mmol/L (ref 135–145)

## 2023-07-12 LAB — GLUCOSE, CAPILLARY
Glucose-Capillary: 198 mg/dL — ABNORMAL HIGH (ref 70–99)
Glucose-Capillary: 194 mg/dL — ABNORMAL HIGH (ref 70–99)
Glucose-Capillary: 179 mg/dL — ABNORMAL HIGH (ref 70–99)
Glucose-Capillary: 157 mg/dL — ABNORMAL HIGH (ref 70–99)
Glucose-Capillary: 205 mg/dL — ABNORMAL HIGH (ref 70–99)

## 2023-07-12 LAB — MAGNESIUM: Magnesium: 2.1 mg/dL (ref 1.7–2.4)

## 2023-07-12 MED ORDER — ALBUTEROL SULFATE HFA 108 (90 BASE) MCG/ACT IN AERS: 2.0000 | INHALATION_SPRAY | RESPIRATORY_TRACT | Status: DC | PRN

## 2023-07-12 MED ORDER — FINASTERIDE 5 MG PO TABS
5.0000 mg | ORAL_TABLET | Freq: Every day | ORAL | Status: DC
  Administered 2023-07-13 – 2023-07-22 (×10): 5 mg via ORAL
  Filled 2023-07-12 (×10): qty 1

## 2023-07-12 MED ORDER — FUROSEMIDE 10 MG/ML IJ SOLN
40.0000 mg | Freq: Two times a day (BID) | INTRAMUSCULAR | Status: AC
Start: 2023-07-12 — End: 2023-07-12
  Administered 2023-07-12 (×2): 40 mg via INTRAVENOUS
  Filled 2023-07-12 (×2): qty 4

## 2023-07-12 MED ORDER — PANTOPRAZOLE SODIUM 40 MG PO TBEC
40.0000 mg | DELAYED_RELEASE_TABLET | Freq: Every day | ORAL | Status: DC
  Administered 2023-07-12 – 2023-07-21 (×10): 40 mg via ORAL
  Filled 2023-07-12 (×10): qty 1

## 2023-07-12 MED ORDER — ALBUTEROL SULFATE (2.5 MG/3ML) 0.083% IN NEBU: 2.5000 mg | INHALATION_SOLUTION | RESPIRATORY_TRACT | Status: DC | PRN

## 2023-07-12 MED ORDER — HYDRALAZINE HCL 50 MG PO TABS
100.0000 mg | ORAL_TABLET | Freq: Three times a day (TID) | ORAL | Status: DC
Start: 2023-07-12 — End: 2023-07-14
  Administered 2023-07-12 – 2023-07-14 (×6): 100 mg via ORAL
  Filled 2023-07-12 (×6): qty 2

## 2023-07-12 MED ORDER — ISOSORBIDE MONONITRATE ER 30 MG PO TB24
30.0000 mg | ORAL_TABLET | Freq: Every day | ORAL | Status: DC
Start: 2023-07-12 — End: 2023-07-14
  Administered 2023-07-12 – 2023-07-14 (×3): 30 mg via ORAL
  Filled 2023-07-12 (×3): qty 1

## 2023-07-12 MED ORDER — TAMSULOSIN HCL 0.4 MG PO CAPS
0.4000 mg | ORAL_CAPSULE | Freq: Every day | ORAL | Status: DC
  Administered 2023-07-12: 0.4 mg via ORAL
  Filled 2023-07-12: qty 1

## 2023-07-12 MED ORDER — ROSUVASTATIN CALCIUM 20 MG PO TABS: 20.0000 mg | ORAL_TABLET | Freq: Every day | ORAL | Status: DC

## 2023-07-12 MED ORDER — ASPIRIN 81 MG PO TBEC: 81.0000 mg | DELAYED_RELEASE_TABLET | Freq: Every day | ORAL | Status: DC

## 2023-07-12 NOTE — Progress Notes (Signed)
Mobility Specialist Progress Note;   07/12/23 1425  Mobility  Activity Ambulated with assistance in hallway  Level of Assistance Standby assist, set-up cues, supervision of patient - no hands on  Assistive Device Front wheel walker  Distance Ambulated (ft) 100 ft  Activity Response Tolerated well  Mobility Referral Yes  Mobility visit 1 Mobility  Mobility Specialist Start Time (ACUTE ONLY) 1425  Mobility Specialist Stop Time (ACUTE ONLY) 1440  Mobility Specialist Time Calculation (min) (ACUTE ONLY) 15 min   Pt eager for mobility this afternoon. Required no physical assistance during ambulation, SV. Ambulated on RA, VSS throughout. Pt states he gets SOB after ambulating further distances, wanting supplemental O2, however SPO2 WFL throughout. No other c/o durign session. Pt returned back to EOB w/ all needs met.   Caesar Bookman Mobility Specialist Please contact via SecureChat or [employer] [phone]

## 2023-07-12 NOTE — TOC Initial Note (Signed)
Transition of Care [facility]) - Initial/Assessment Note    Patient Details  Name: [name] MRN: [id number] Date of Birth: May 17, 1966  [facility]) CM/SW Contact:    Harriet Masson, RN Phone Number: 07/12/2023, 3:15 PM  Clinical Narrative:                 Spoke to patient regarding transition needs. Patient lives with girlfriend.  Patient states he wants a PCP in [place] and is agreeable for TOC to make apt.  This RNCM sent request to CMA to make PCP apt. Patient has apt 07/23/23 @ heart failure clinic. Patient drives himself but has family to assist as needed. TOC will continue to follow for needs. Expected Discharge Plan: Home/Self Care Barriers to Discharge: Continued Medical Work up   Patient Goals and CMS Choice Patient states their goals for this hospitalization and ongoing recovery are:: return home          Expected Discharge Plan and Services In-house Referral: PCP / [facility] Discharge Planning Services: Follow-up appt scheduled   Living arrangements for the past 2 months: Single Family Home                                      Prior Living Arrangements/Services Living arrangements for the past 2 months: Single Family Home Lives with:: Significant Other Patient language and need for interpreter reviewed:: Yes Do you feel safe going back to the place where you live?: Yes      Need for Family Participation in Patient Care: Yes (Comment) Care giver support system in place?: Yes (comment)   Criminal Activity/Legal Involvement Pertinent to Current Situation/Hospitalization: No - Comment as needed  Activities of Daily Living   ADL Screening (condition at time of admission) Independently performs ADLs?: Yes (appropriate for developmental age) Is the patient deaf or have difficulty hearing?: No Does the patient have difficulty seeing, even when wearing glasses/contacts?: No Does the patient have difficulty concentrating,  remembering, or making decisions?: No  Permission Sought/Granted                  Emotional Assessment Appearance:: Appears stated age Attitude/Demeanor/Rapport: Engaged Affect (typically observed): Accepting Orientation: : Oriented to Self, Oriented to Place, Oriented to  Time, Oriented to Situation   Psych Involvement: No (comment)  Admission diagnosis:  Peripheral edema [R60.0] Cocaine abuse (HCC) [F14.10] Hypertensive urgency [I16.0] AKI (acute kidney injury) (HCC) [N17.9] Medically noncompliant [Z91.199] Acute on chronic congestive heart failure, unspecified heart failure type (HCC) [I50.9] Edema of both lower legs [R60.0] Patient Active Problem List   Diagnosis Date Noted   Protein-calorie malnutrition, severe 07/12/2023   Acute on chronic systolic CHF (congestive heart failure) (HCC) 07/11/2023   AKI (acute kidney injury) (HCC) 07/10/2023   Elevated LFTs 07/10/2023   Bilateral lower extremity edema 07/10/2023   DM II (diabetes mellitus, type II), controlled (HCC) 07/10/2023   Cocaine abuse (HCC) 05/28/2023   Swelling of both lower extremities 05/26/2023   Dyspnea on exertion 03/19/2021   Cigarette smoker 03/19/2021   Allergic rhinitis 03/11/2021   Anxiety 03/11/2021   Asthma 03/11/2021   Bipolar disorder (HCC) 03/11/2021   Bradycardia 03/11/2021   CAD (coronary artery disease) 03/11/2021   CVA (cerebral vascular accident) (HCC) 03/11/2021   GERD (gastroesophageal reflux disease) 03/11/2021   Headache 03/11/2021   Hepatitis C 03/11/2021   History of heart artery stent 03/11/2021  History of myocardial infarction 03/11/2021   History of stroke 03/11/2021   Hypercholesterolemia 03/11/2021   Hypertension 03/11/2021   MI (myocardial infarction) (HCC) 03/11/2021   Stroke (HCC) 03/11/2021   PCP:  Pcp, No Pharmacy:   [id number] [place],  - [address] AT [facility] [address] [place] [address] Phone: [phone]  Fax: [phone]     Social Drivers of Health (SDOH) Social History: SDOH Screenings   Food Insecurity: No Food Insecurity (07/11/2023)  Housing: Unknown (07/12/2023)  Recent Concern: Housing - High Risk (07/11/2023)  Transportation Needs: No Transportation Needs (07/12/2023)  Utilities: Not At Risk (07/11/2023)  Alcohol Screen: Low Risk  (07/12/2023)  Financial Resource Strain: Low Risk  (07/12/2023)  Tobacco Use: High Risk (07/10/2023)   SDOH Interventions: Housing Interventions: Intervention Not Indicated Transportation Interventions: Intervention Not Indicated Alcohol Usage Interventions: Intervention Not Indicated (Score <7) Financial Strain Interventions: Intervention Not Indicated   Readmission Risk Interventions     No data to display

## 2023-07-12 NOTE — Assessment & Plan Note (Signed)
Continue with nutritional supplements.

## 2023-07-12 NOTE — Progress Notes (Signed)
*  PRELIMINARY RESULTS* Echocardiogram 2D Echocardiogram has been performed. Patient refused to complete ECHO; no bubble study was done  Laddie Aquas 07/12/2023, 9:06 AM

## 2023-07-12 NOTE — Progress Notes (Signed)
Rounding Note    Patient Name: [name] Date of Encounter: 07/12/2023  [facility] Cardiologist: Dr Tomie China  Subjective  He has significant leg swelling He was requesting to have an oxygen need assessment  Inpatient Medications    Scheduled Meds:  aspirin  81 mg Oral Daily   heparin  5,000 Units Subcutaneous Q8H   hydrALAZINE  50 mg Oral Q8H   insulin aspart  0-15 Units Subcutaneous TID WC   nicotine  14 mg Transdermal Daily   pantoprazole (PROTONIX) IV  40 mg Intravenous Q24H   rosuvastatin  40 mg Oral Daily   sodium chloride flush  3 mL Intravenous Q12H   Continuous Infusions:  PRN Meds: acetaminophen **OR** acetaminophen, hydrALAZINE, nitroGLYCERIN, ondansetron **OR** ondansetron (ZOFRAN) IV   Vital Signs    Vitals:   07/11/23 2149 07/11/23 2318 07/12/23 0341 07/12/23 0635  BP: (!) 144/94 (!) 155/89 (!) 154/104 (!) 159/102  Pulse:  88 80   Resp: 12 15 19    Temp: 98 F (36.7 C) 98.2 F (36.8 C) 98.2 F (36.8 C)   TempSrc: Oral Oral Oral   SpO2:  100% 100%   Weight:   79.6 kg   Height:        Intake/Output Summary (Last 24 hours) at 07/12/2023 0737 Last data filed at 07/12/2023 0600 Gross per 24 hour  Intake 960 ml  Output 4550 ml  Net -3590 ml      07/12/2023    3:41 AM 07/11/2023    3:32 PM 05/28/2023    7:55 AM  Last 3 Weights  Weight (lbs) 175 lb 7.8 oz 172 lb 9.6 oz 163 lb  Weight (kg) 79.6 kg 78.291 kg 73.936 kg      Telemetry    Sinus - Personally Reviewed   Physical Exam   GEN: No acute distress.   Neck: supple Cardiac: RRR, no murmurs, rubs, or gallops.  Respiratory: Clear to auscultation bilaterally. GI: Soft, nontender, non-distended  MS: 4+ edema Neuro:  Nonfocal  Psych: Normal affect   Labs    High Sensitivity Troponin:   Recent Labs  Lab 07/10/23 1549 07/10/23 1752  TROPONINIHS 91* 78*     Chemistry Recent Labs  Lab 07/10/23 1549 07/10/23 2036 07/11/23 0449 07/12/23 0209  NA 137  --  138  135  K 5.0  --  3.6 3.7  CL 106  --  108 104  CO2 21*  --  21* 22  GLUCOSE 119*  --  160* 161*  BUN 52*  --  43* 38*  CREATININE 1.84* 1.80* 1.85* 1.82*  CALCIUM 8.7*  --  8.4* 8.2*  MG  --   --   --  2.1  PROT 5.2*  --  5.5*  --   ALBUMIN 2.7*  --  2.7*  --   AST 50*  --  39  --   ALT 54*  --  48*  --   ALKPHOS 116  --  115  --   BILITOT 1.5*  --  1.3*  --   GFRNONAA 42* 43* 42* 43*  ANIONGAP 10  --  9 9    Lipids  Recent Labs  Lab 07/10/23 2036  CHOL 218*  TRIG 108  HDL 44  LDLCALC 152*  CHOLHDL 5.0    Hematology Recent Labs  Lab 07/10/23 1549 07/10/23 2036 07/11/23 0449  WBC 9.5 9.9 9.9  RBC 4.79 5.16 4.91  HGB 14.8 16.1 15.2  HCT 44.9 47.9 45.8  MCV 93.7 92.8 93.3  MCH 30.9 31.2 31.0  MCHC 33.0 33.6 33.2  RDW 16.6* 16.8* 16.8*  PLT 204 232 193   Thyroid  Recent Labs  Lab 07/10/23 2036  TSH 4.268    BNP Recent Labs  Lab 07/10/23 1549  BNP >4,500.0*      Radiology    DG Chest Port 1 View Result Date: 07/10/2023 CLINICAL DATA:  Shortness of breath EXAM: PORTABLE CHEST 1 VIEW COMPARISON:  Chest radiograph dated 01/15/2010 FINDINGS: Patient is rotated to the right. Low lung volumes with bronchovascular crowding. Diffuse interstitial opacities. Small right pleural effusion. No pneumothorax. Enlarged cardiomediastinal silhouette. No acute osseous abnormality. IMPRESSION: 1. Low lung volumes with bronchovascular crowding. Diffuse interstitial opacities, likely pulmonary edema. 2. Small right pleural effusion. 3. Marked cardiomegaly. Electronically Signed   By: Agustin Cree M.D.   On: 07/10/2023 17:01    Patient Profile     58 y.o. male with PMH of CAD, bipolar, CVA, substance abuse, hypertension, noncompliance for evaluation of acute on chronic diastolic CHF. Seen recently in the office and echo ordered but not performed.  Assessment & Plan    1 Acute on chronic systolic CHF-pt has been noncompliant with meds. Significantly volume overloaded. Continue  lasix 40 mg BID and follow renal function. Will consider addition of SGLT2I and spironolactone as outpt if pt compliant with FU and meds; and renal function improves.   -Pending echo -continue lasix  2 hypertension-BP elevated; would avoid clonidine as outpt due to noncompliance and risk of rebound hypertension if he misses doses; continue hydralazine 50 mg tid. Add nitrates if LV function reduced. Will avoid ARB or entresto for now due to renal insuff. If LV function reduced, add coreg. -Increased his hydralazine 50 to100 TID -If his EF is less than 40% would add Imdur 30  3 acute on chronic stage 3a kidney disease-follow renal function with diuresis.  Complicated with recent cocaine use  4 cocaine abuse-needs to avoid.   5 elevated troponin-minimal; no plans for ischemia eval at this point. He would need to demonstrate compliance with meds and FU prior to procedures.  6 H/O CAD-add ASA 81 mg daily and statin.  He is going to require at least a few days of diuresis.   Time Spent Directly with Patient:  I have spent a total of 35 minutes with the patient reviewing hospital notes, telemetry, EKGs, labs and examining the patient as well as establishing an assessment and plan that was discussed personally with the patient.     For questions or updates, please contact [facility] Please consult [email or web address] for contact info under     Signed, Maisie Fus, MD  07/12/2023, 7:37 AM

## 2023-07-12 NOTE — Progress Notes (Addendum)
Initial Nutrition Assessment  DOCUMENTATION CODES:   Severe malnutrition in context of chronic illness  INTERVENTION:  Double portion proteins Magic Cup TID QHS snack  Discussed medication compliance and sodium restriction 2/2 hospitalization prevention  NUTRITION DIAGNOSIS:  Severe Malnutrition related to chronic illness as evidenced by energy intake < 75% for > or equal to 1 month, severe fat depletion, severe muscle depletion, percent weight loss.  GOAL:  Patient will meet greater than or equal to 90% of their needs  MONITOR:  Supplement acceptance, PO intake, Weight trends  REASON FOR ASSESSMENT:  Consult Assessment of nutrition requirement/status  ASSESSMENT:  Pt presented to ED with c/o SOB and BLE swelling. Endorses noncompliance with medications x 3-4 weeks. Found to have heart failure exacerbation in setting of cocaine intoxication. PMH: T2DM, CHF, CVA, HTN, COPD, Hep C, CAD, hx of cocaine abuse.  Patient requesting additional meals/snacks, per notes on 1/26. Stated that appetite was stable/adequate prior to admission. Does note he was trying to lose weight d/t being told he is "borderline diabetic" and was eating only one meal per day. Degree of muscle and fat wasting consistent with this report. No documented intakes at this time.  24 Hour Recall: B: eggs w/ cheese, [place] sausage, toast L: sandwich D: hamburger  Snacks: ice cream  More recently, he has only been consuming lunch meals. He endorses doing the cooking and grocery shopping and resides with his girlfriend. He is amicable to double portions proteins and Magic Cup to supplement protein losses and preserve lean body mass.  Discussed medication adherence to manage fluid status as well as limiting sodium intake, especially hidden sources of sodium. He verbalized understanding. Follow through questionable.  He states his UBW is around 210lbs w/ current body weight around 175lbs. Endorses 30lbs wt loss over  last six months. This is 14%. Continued wt loss likely 2/2 diuresing and degree of edema to BLEs.   Admit Weight: 78.3kg Current Weight: 79.6kg  Renal function being monitored in setting of aggressive diuresis. Edema to BLEs likely masking significant degree of muscle and fat wasting. Unable to assess BLEs at this time.  Intake/Output Summary (Last 24 hours) at 07/12/2023 1133 Last data filed at 07/12/2023 0900 Gross per 24 hour  Intake 960 ml  Output 3650 ml  Net -2690 ml     Net IO Since Admission: -7,600 mL [07/12/23 1133]   Labs: CBGs 119-161 over 48 hours A1c 6.9 (05/2023) Crt 1.80>1.85>1.82  Meds: furosemide, SSI, pantoprazole     NUTRITION - FOCUSED PHYSICAL EXAM:  Flowsheet Row Most Recent Value  Orbital Region Mild depletion  Upper Arm Region Moderate depletion  Thoracic and Lumbar Region Severe depletion  Buccal Region Severe depletion  Temple Region Moderate depletion  Clavicle Bone Region Severe depletion  Clavicle and Acromion Bone Region Severe depletion  Scapular Bone Region Severe depletion  Dorsal Hand Mild depletion  Patellar Region Unable to assess  [significant edema BLEs]  Anterior Thigh Region Unable to assess  [significant edema BLEs]  Posterior Calf Region Unable to assess  [significant edema BLEs]  Edema (RD Assessment) Severe  Hair Reviewed  Eyes Reviewed  Mouth Reviewed  Skin Reviewed  Nails Reviewed    Diet Order:   Diet Order             Diet Heart Room service appropriate? Yes; Fluid consistency: Thin  Diet effective now             EDUCATION NEEDS:   Education needs have been addressed  Skin:  Skin Assessment: Reviewed RN Assessment  Last BM:  PTA - 1/25  Height:  Ht Readings from Last 1 Encounters:  07/11/23 6\' 2"  (1.88 m)    Weight:  Wt Readings from Last 1 Encounters:  07/12/23 79.6 kg    Ideal Body Weight:  86.4 kg  BMI:  Body mass index is 22.53 kg/m.  Estimated Nutritional Needs:   Kcal:   2200-2400kcal  Protein:  120-130g  Fluid:  >2L/day  Myrtie Cruise MS, RD, LDN Registered Dietitian Clinical Nutrition RD Inpatient Contact Info in Amion

## 2023-07-12 NOTE — Evaluation (Signed)
Occupational Therapy Evaluation Patient Details Name: [name] MRN: [id number] DOB: [birth year] Today's Date: 07/12/2023   History of Present Illness 58 y.o. male presents to [facility] 07/10/23 with B LE edema and SOB. Suspect edema 2/2 systolic and diastolic CHF. Pt also with acute on chronic kidney disease. Labs positive for cocaine. PMHx: HTN, MI s/p stent, CVA, cocaine abuse, CAD   Clinical Impression   PTA, pt lived with significant other and reports his family is always around who assists the pt with LB ADL. Upon eval, pt with generalized weakness, decreased posture, balance, and safety awareness. Pt with multiple reports of just being here to receive home O2. Pt needing redirection to understand need to manage fluid levels rather than just using home O2. Pt needing up to mod A for LB ADL at this time. Will continue to follow acutely but family assists with LB ADL at home, so no follow up OT recommended at this time.       If plan is discharge home, recommend the following: A little help with walking and/or transfers;A little help with bathing/dressing/bathroom;Assistance with cooking/housework;Assist for transportation;Help with stairs or ramp for entrance    Functional Status Assessment  Patient has had a recent decline in their functional status and demonstrates the ability to make significant improvements in function in a reasonable and predictable amount of time.  Equipment Recommendations  None recommended by OT    Recommendations for Other Services       Precautions / Restrictions Precautions Precautions: Fall Restrictions Weight Bearing Restrictions Per Provider Order: No      Mobility Bed Mobility Overal bed mobility: Modified Independent                  Transfers Overall transfer level: Needs assistance Equipment used: None Transfers: Sit to/from Stand Sit to Stand: Supervision           General transfer comment: supervision for safety       Balance Overall balance assessment: Needs assistance, Mild deficits observed, not formally tested Sitting-balance support: Feet supported, No upper extremity supported Sitting balance-Leahy Scale: Good     Standing balance support: No upper extremity supported Standing balance-Leahy Scale: Fair Standing balance comment: benefits from RW                           ADL either performed or assessed with clinical judgement   ADL Overall ADL's : Needs assistance/impaired Eating/Feeding: Modified independent;Sitting   Grooming: Set up;Sitting   Upper Body Bathing: Set up;Sitting   Lower Body Bathing: Minimal assistance;Sit to/from stand   Upper Body Dressing : Set up;Sitting   Lower Body Dressing: Moderate assistance;Sit to/from stand   Toilet Transfer: Supervision/safety;Rolling walker (2 wheels);Ambulation   Toileting- Clothing Manipulation and Hygiene: Set up;Sit to/from stand       Functional mobility during ADLs: Supervision/safety;Rolling walker (2 wheels)       Vision Patient Visual Report: No change from baseline Vision Assessment?: No apparent visual deficits     Perception Perception: Not tested       Praxis Praxis: Not tested       Pertinent Vitals/Pain Pain Assessment Pain Assessment: No/denies pain     Extremity/Trunk Assessment Upper Extremity Assessment Upper Extremity Assessment: Generalized weakness   Lower Extremity Assessment Lower Extremity Assessment: Defer to PT evaluation   Cervical / Trunk Assessment Cervical / Trunk Assessment: Normal   Communication Communication Communication: No apparent difficulties Cueing Techniques: Verbal cues  Cognition Arousal: Alert Behavior During Therapy: Impulsive Overall Cognitive Status: No family/caregiver present to determine baseline cognitive functioning                                 General Comments: A&Ox4, impulsive with moving quickly and decreased safety  awareness. Questionable health literacy with pt reporting he is not taking his medication because he needs home O2. Pt reporting thats why I cant breathe is because I dont have home O2 and I need it when I get all this fluid on my lungs. OT attempted to reorient pt to fact that he needs to be checked out if fluid is on lungs rather than using O2 as a bandaid     General Comments  VSS on RA. pt did desat 1x during hallway mobility needing 1 standing rest break ~15 seconds; poor pleth unsure of accuracy of reading but reading 88% SpO2    Exercises     Shoulder Instructions      Home Living Family/patient expects to be discharged to:: Private residence Living Arrangements: Spouse/significant other Available Help at Discharge: Family;Available 24 hours/day Type of Home: House Home Access: Stairs to enter [employer] of Steps: 3 Entrance Stairs-Rails: Right;Left;Can reach both Home Layout: One level     Bathroom Shower/Tub: Sponge bathes at baseline   [employer]: Standard     Home Equipment: Agricultural consultant (2 wheels);Cane - single point;BSC/3in1;Wheelchair - Manufacturing systems engineer - built in          Prior Functioning/Environment Prior Level of Function : Driving;Needs assist             Mobility Comments: ModI w/ SP cane for short distances ADLs Comments: help from family with LB dressing, bathing, sponge bath at baseline        OT Problem List: Decreased strength;Decreased activity tolerance;Impaired balance (sitting and/or standing);Decreased safety awareness      OT Treatment/Interventions: Self-care/ADL training;Therapeutic exercise;DME and/or AE instruction;Patient/family education;Balance training;Therapeutic activities    OT Goals(Current goals can be found in the care plan section) Acute Rehab OT Goals Patient Stated Goal: get home O2 OT Goal Formulation: With patient Time For Goal Achievement: 07/26/23 Potential to Achieve Goals: Good  OT  Frequency: Min 1X/week    Co-evaluation              AM-PAC OT "6 Clicks" Daily Activity     Outcome Measure Help from another person eating meals?: None Help from another person taking care of personal grooming?: A Little Help from another person toileting, which includes using toliet, bedpan, or urinal?: A Little Help from another person bathing (including washing, rinsing, drying)?: A Lot Help from another person to put on and taking off regular upper body clothing?: A Little Help from another person to put on and taking off regular lower body clothing?: A Lot 6 Click Score: 17   End of Session Equipment Utilized During Treatment: Gait belt;Rolling walker (2 wheels) Nurse Communication: Mobility status  Activity Tolerance: Patient tolerated treatment well Patient left: in bed;with call bell/phone within reach  OT Visit Diagnosis: Unsteadiness on feet (R26.81);Muscle weakness (generalized) (M62.81)                Time: 4696-2952 OT Time Calculation (min): 18 min Charges:  OT General Charges $OT Visit: 1 Visit OT Evaluation $OT Eval Low Complexity: 1 Low  Tyler Deis, OTR/L [facility] Acute Rehabilitation Office: [phone]   Lorain Childes  Gordy Levan 07/12/2023, 9:40 AM

## 2023-07-12 NOTE — Progress Notes (Signed)
Heart Failure Nurse Navigator Progress Note  PCP: Pcp, No PCP-Cardiologist: Revankar Admission Diagnosis: Acute on chronic heart failure, Peripheral edema, HTN urgency, AKI, cocaine abuse.  Admitted from: Home  Presentation:   [name] presented with shortness of breath,  bilateral leg edema up to his scrotum for 2 months, reports non compliance of his BP and HF medications. BP 148/97, HR 87, BNP >4,500, UDS positive for cocaine, reported non compliance of both medication and appointment follow ups.   Patient was educated on the sign and symptoms of heart failure, daily weights, when ot call his doctor or go to the ED, Diet/ fluid restrictions, taking all medications as prescribed and attending all medical appointments, patient verbalized his understanding of education. A HF TOC appointment was scheduled for 07/23/2023 @ 10 am.   ECHO/ LVEF: 30%  Clinical Course:  Past Medical History:  Diagnosis Date   Allergic rhinitis    Anxiety    Asthma    Bipolar disorder (HCC)    Bradycardia    CAD (coronary artery disease)    CVA (cerebral vascular accident) (HCC)    GERD (gastroesophageal reflux disease)    Headache    Hepatitis C    History of heart artery stent    History of myocardial infarction    History of stroke    Hypercholesterolemia    Hypertension    MI (myocardial infarction) (HCC)    Stroke (HCC)      Social History   Socioeconomic History   Marital status: Single    Spouse name: Not on file   Number of children: 3   Years of education: Not on file   Highest education level: 11th grade  Occupational History   Occupation: disablity  Tobacco Use   Smoking status: Every Day    Types: Cigarettes   Smokeless tobacco: Never  Vaping Use   Vaping status: Never Used  Substance and Sexual Activity   Alcohol use: No   Drug use: No    Comment: last use cocaine "a couple months ago"   Sexual activity: Not on file  Other Topics Concern   Not on file  Social  History Narrative   Not on file   Social Drivers of Health   Financial Resource Strain: Low Risk  (07/12/2023)   Overall Financial Resource Strain (CARDIA)    Difficulty of Paying Living Expenses: Not very hard  Food Insecurity: No Food Insecurity (07/11/2023)   Hunger Vital Sign    Worried About Running Out of Food in the Last Year: Never true    Ran Out of Food in the Last Year: Never true  Transportation Needs: No Transportation Needs (07/12/2023)   PRAPARE - Administrator, Civil Service (Medical): No    Lack of Transportation (Non-Medical): No  Physical Activity: Not on file  Stress: Not on file  Social Connections: Not on file   Education Assessment and Provision:  Detailed education and instructions provided on heart failure disease management including the following:  Signs and symptoms of Heart Failure When to call the physician Importance of daily weights Low sodium diet Fluid restriction Medication management Anticipated future follow-up appointments  Patient education given on each of the above topics.  Patient acknowledges understanding via teach back method and acceptance of all instructions.  Education Materials:  "Living Better With Heart Failure" Booklet, HF zone tool, & Daily Weight Tracker Tool.  Patient has scale at home: Yes Patient has pill box at home: NA  High Risk Criteria for Readmission and/or Poor Patient Outcomes: Heart failure hospital admissions (last 6 months): 1  No Show rate: 40 %  Difficult social situation: No, lives with his girlfriend.  Demonstrates medication adherence: No Primary Language: English Literacy level: Reading, writing, and comprehension  Barriers of Care:   Medication/ appointment compliance Substance abuse ( cocaine) Diet/ fluid restrictions/ daily weights  Considerations/Referrals:   Referral made to Heart Failure Pharmacist Stewardship: yes Referral made to Heart Failure CSW/NCM TOC: yes, substance  cessation Referral made to Heart & Vascular TOC clinic: yes, 07/23/2023 @ 10 am, patient reported his daughter will drive him to appointment.   Items for Follow-up on DC/TOC: Medication/ appointment compliance Diet/ fluid restrictions/ daily weights Substance cessation ( cocaine)  Continued HF education   Rhae Hammock, BSN, RN Heart Failure Print production planner Chat Only

## 2023-07-12 NOTE — Progress Notes (Signed)
Progress Note   Patient: [name]:[id number] DOB: 05/11/1966 DOA: 07/10/2023     2 DOS: the patient was seen and examined on 07/12/2023   Brief House course: [name] was admitted to the House with the working diagnosis of heart failure exacerbation in the setting of cocaine intoxication.   57 yo male with the past medical history of cocaine abuse, hypertension, coronary artery disease, CVA, T2DM and bipolar disorder who presented with lower extremity edema. Apparently not adherent to medical therapy or outpatient follow up. On his initial physical examination his blood pressure was 178/130, HR 92, RR 20 and 02 saturation 100%, lungs with no wheezing or rhonchi, heart with S1 and S2 present and regular with no gallops, rubs or murmurs, abdomen with no distention and positive lower extremity edema.   Na 137, K 5,0 Cl 106 bicarbonate 21, glucose 119, bun 52 cr 1,85  AST 50 ALT 54 T Bil 1,5  BNP >4,500  High sensitive troponin 91 and 78  Wbc 9,5 hgb 14,8 plt 204  Sars covid 19 negative  Influenza negative   Urine analysis SG 1,014, protein 100, negative leukocytes and negative hgb.   Toxicology screen positive for cocaine   Chest radiograph with cardiomegaly with bilateral hilar vascular congestion, bilateral central interstitial infiltrates with cephalization in the vasculature and fluid in the right fissure.   EKG 78 bpm, normal axis, normal intervals qtc 429, sinus rhythm with poor R R wave progression, ST depression and negative T wave V5 and V6 with positive LVH.   01/27 responding to diuresis but continue very volume overloaded.    Assessment and Plan: * Acute on chronic systolic CHF (congestive heart failure) (HCC) Echocardiogram with reduced LV systolic function 30%, LV with moderate to severe reduction in systolic function, diffuse hypokinesis, akinesis of the basal to mid infero lateral and antero lateral wall. Moderate LV cavity dilatation and mild LVH. RV  systolic function low normal, RV with moderate enlargement, RVSP 59,9 mmHg.  LA and RA with severe dilatation. Moderate to severe MR, mild to moderate TR, trivial pericardial effusion,   Urine output is 4,500 ml Systolic blood pressure  140 mmHg range   Plan to continue diuresis with furosemide IV 40 mg bid After load reduction with hydralazine, and isosorbide.   No RAAS inhibition due to acutely reduced GFR.   CAD (coronary artery disease) Continue aspirin and statin  No chest pain.  High sensitive troponin elevation due to heart failure, ruled out acute coronary syndrome.   Hypertension Continue aggressive diuresis, and hydralazine and isosorbide. Holding clonidine due to rebound effects when non adherence.  Continue blood pressure monitoring.     AKI (acute kidney injury) (HCC) Renal function with serum cr at 1,82 with K at 3,7 and serum bicarbonate at 22  Na 135 and Mg 2,1   Plan to continue diuresis with furosemide.  Add 40 meq Kcl  Follow up renal function and electrolytes in am.   DM II (diabetes mellitus, type II), controlled (HCC) Continue glucose cover and monitoring with insulin sliding scale.  Patient is tolerating po well.   Anxiety Continue buspirone.   Cigarette smoker Nicotine patch counseling once patient is stable.  Cocaine abuse (HCC) Counseling and TOC consult for substance abuse resource.  GERD (gastroesophageal reflux disease) Continue pantoprazole.   History of stroke History of CVA secondary to essential hypertension, polysubstance abuse and medical noncompliance. Currently patient at baseline and is nonfocal.   Protein-calorie malnutrition, severe Continue with nutritional supplements.  Bilateral lower extremity edema Patient presenting with bilateral lower extremity edema extending from his legs to his groin and lower abdomen anasarca picture, elevated BNP of 4500, judging from his clinical features suspect a combination of systolic  and diastolic congestive heart failure no recent echoes, last echocardiogram in our chart is in 2011 where his function was noted to be within normal limits. Lasix 40 mg bid x 2 doses.   Elevated LFTs Transaminitis secondary to passive hepatic congestion from CHF exacerbation. Will monitor low threshold for liver ultrasound.   CVA (cerebral vascular accident) [name]) History of CVA secondary to essential hypertension, polysubstance abuse and medical noncompliance. Currently patient at baseline and is nonfocal. Will DC patient on aspirin and statin therapy.        Subjective: Patient is feeling better, improved edema but not back to baseline, no chest pain, continue to have dyspnea on exertion   Physical Exam: Vitals:   07/12/23 0341 07/12/23 0635 07/12/23 0744 07/12/23 1043  BP: (!) 154/104 (!) 159/102 (!) 157/132 (!) 146/99  Pulse: 80  73 82  Resp: 19  17 17   Temp: 98.2 F (36.8 C)  98 F (36.7 C) 98 F (36.7 C)  TempSrc: Oral  Oral Oral  SpO2: 100%  100% 98%  Weight: 79.6 kg     Height:       Neurology awake and alert  ENT with mild pallor Cardiovascular with S1 and S2 present and regular with no gallops or rubs, positive systolic murmur at the apex No JVD Positive lower extremity edema +++ pitting, warm lower extremities  Respiratory with mild rales at bases with no wheezing or rhonchi Abdomen with no distention  Data Reviewed:    Family Communication: no family at the bedside   Disposition: Status is: Inpatient Remains inpatient appropriate because: IV diuresis   Planned Discharge Destination: Home    Author: Coralie Keens, MD 07/12/2023 11:35 AM  For on call review [email or web address].

## 2023-07-13 ENCOUNTER — Other Ambulatory Visit (HOSPITAL_COMMUNITY): Payer: Self-pay

## 2023-07-13 ENCOUNTER — Telehealth (HOSPITAL_COMMUNITY): Payer: Self-pay | Admitting: Pharmacy Technician

## 2023-07-13 DIAGNOSIS — I5023 Acute on chronic systolic (congestive) heart failure: Secondary | ICD-10-CM | POA: Diagnosis not present

## 2023-07-13 DIAGNOSIS — I2510 Atherosclerotic heart disease of native coronary artery without angina pectoris: Secondary | ICD-10-CM | POA: Diagnosis not present

## 2023-07-13 DIAGNOSIS — I5033 Acute on chronic diastolic (congestive) heart failure: Secondary | ICD-10-CM | POA: Diagnosis not present

## 2023-07-13 DIAGNOSIS — N179 Acute kidney failure, unspecified: Secondary | ICD-10-CM | POA: Diagnosis not present

## 2023-07-13 DIAGNOSIS — I10 Essential (primary) hypertension: Secondary | ICD-10-CM | POA: Diagnosis not present

## 2023-07-13 LAB — GLUCOSE, CAPILLARY
Glucose-Capillary: 185 mg/dL — ABNORMAL HIGH (ref 70–99)
Glucose-Capillary: 305 mg/dL — ABNORMAL HIGH (ref 70–99)
Glucose-Capillary: 243 mg/dL — ABNORMAL HIGH (ref 70–99)
Glucose-Capillary: 238 mg/dL — ABNORMAL HIGH (ref 70–99)
Glucose-Capillary: 142 mg/dL — ABNORMAL HIGH (ref 70–99)

## 2023-07-13 LAB — BASIC METABOLIC PANEL
Anion gap: 9 (ref 5–15)
BUN: 38 mg/dL — ABNORMAL HIGH (ref 6–20)
CO2: 25 mmol/L (ref 22–32)
Calcium: 8.4 mg/dL — ABNORMAL LOW (ref 8.9–10.3)
Chloride: 104 mmol/L (ref 98–111)
Creatinine, Ser: 1.39 mg/dL — ABNORMAL HIGH (ref 0.61–1.24)
GFR, Estimated: 59 mL/min — ABNORMAL LOW (ref >60)
Glucose, Bld: 134 mg/dL — ABNORMAL HIGH (ref 70–99)
Potassium: 3.5 mmol/L (ref 3.5–5.1)
Sodium: 138 mmol/L (ref 135–145)

## 2023-07-13 LAB — MAGNESIUM: Magnesium: 2 mg/dL (ref 1.7–2.4)

## 2023-07-13 MED ORDER — POTASSIUM CHLORIDE CRYS ER 20 MEQ PO TBCR
40.0000 meq | EXTENDED_RELEASE_TABLET | Freq: Once | ORAL | Status: AC
  Administered 2023-07-13: 40 meq via ORAL
  Filled 2023-07-13: qty 2

## 2023-07-13 MED ORDER — TAMSULOSIN HCL 0.4 MG PO CAPS
0.8000 mg | ORAL_CAPSULE | Freq: Every day | ORAL | Status: DC
  Administered 2023-07-13 – 2023-07-19 (×7): 0.8 mg via ORAL
  Filled 2023-07-13 (×7): qty 2

## 2023-07-13 MED ORDER — CARVEDILOL 12.5 MG PO TABS
12.5000 mg | ORAL_TABLET | Freq: Two times a day (BID) | ORAL | Status: DC
  Administered 2023-07-13 – 2023-07-18 (×11): 12.5 mg via ORAL
  Filled 2023-07-13 (×11): qty 1

## 2023-07-13 NOTE — Progress Notes (Signed)
PROGRESS NOTE    [name] GRIGORIAN  House:[id number] DOB: 1966/04/03 DOA: 07/10/2023 PCP: Donell Beers, FNP  58/M w/ history of cocaine abuse, hypertension, CAD, CVA, T2DM and bipolar disorder who presented with lower extremity edema. Noncompliant w medical therapy or outpatient follow up. IN ED, Hypertensive, positive lower extremity edema. bun 52 cr 1,85 , BNP >4,500, troponin 91 and 78  Toxicology screen positive for cocaine   CXR wcardiomegaly with bilateral hilar vascular congestion, bilateral central interstitial infiltrates with cephalization in the vasculature and fluid in the right fissure.  -1/27 responding to diuresis but remains volume overloaded  Subjective: Feels better, still has swelling, dyspnea on exertion  Assessment and Plan:  Acute on chronic systolic CHF  -Echo with reduced LV systolic function 30%, LV with moderate to severe reduction in systolic function, diffuse hypokinesis, akinesis of the basal to mid infero lateral and antero lateral wall. RV systolic function low normal, Moderate to severe MR,  -9.6 L negative, remains volume overloaded, increase Lasix to 80 Mg twice daily -Add Jardiance, creatinine is improving -Decrease hydralazine and Imdur dose -Cards following, no plan for ischemic workup at this time  Hypoalbuminemia -Unclear if he has liver disease, check coags in a.m.  CAD (coronary artery disease) Continue aspirin and statin  High sensitive troponin elevation due to heart failure, ruled out acute coronary syndrome.   Hypertension Meds as above  AKI (acute kidney injury) (HCC) -Likely cardiorenal, improving with diuresis  DM II (diabetes mellitus, type II), controlled (HCC) Improving, continue SSI, adding Jardiance  Anxiety Continue buspirone.   Cigarette smoker Nicotine patch counseling once patient is stable.  Cocaine abuse (HCC) Counseling and TOC consult for substance abuse resource.  GERD (gastroesophageal reflux  disease) Continue pantoprazole.   Protein-calorie malnutrition, severe Continue with nutritional supplements.   Elevated LFTs Transaminitis secondary to passive hepatic congestion from CHF exacerbation. Will monitor low threshold for liver ultrasound.  CVA (cerebral vascular accident) (HCC) Add aspirin and statin at discharge  DVT prophylaxis: Hep SQ Code Status: Full Code Family Communication: None present Disposition Plan: Home likely 48 hours  Consultants:    Procedures:   Antimicrobials:    Objective: Vitals:   07/13/23 1116 07/13/23 1432 07/13/23 1702 07/13/23 1718  BP: (!) 125/104 (!) 134/95 (!) 140/83 (!) 140/83  Pulse: 85   86  Resp: (!) 21  (!) 22   Temp: 98 F (36.7 C)  98 F (36.7 C)   TempSrc: Oral  Oral   SpO2: 99%  97%   Weight:      Height:        Intake/Output Summary (Last 24 hours) at 07/13/2023 1726 Last data filed at 07/13/2023 0657 Gross per 24 hour  Intake 480 ml  Output 1950 ml  Net -1470 ml   Filed Weights   07/11/23 1532 07/12/23 0341 07/13/23 0328  Weight: 78.3 kg 79.6 kg 78.9 kg    Examination:  General exam: Appears calm and comfortable HEENT: Positive JVD CVS: S1-S2, regular rhythm Lungs: Decreased breath sounds at the bases Abd: nondistended, soft and nontender.Normal bowel sounds heard. Central nervous system: Alert and oriented. No focal neurological deficits. Extremities: 2+ edema Skin: No rashes Psychiatry:  Mood & affect appropriate.     Data Reviewed:   CBC: Recent Labs  Lab 07/10/23 1549 07/10/23 2036 07/11/23 0449  WBC 9.5 9.9 9.9  HGB 14.8 16.1 15.2  HCT 44.9 47.9 45.8  MCV 93.7 92.8 93.3  PLT 204 232 193   Basic Metabolic Panel:  Recent Labs  Lab 07/10/23 1549 07/10/23 2036 07/11/23 0449 07/12/23 0209 07/13/23 0211  NA 137  --  138 135 138  K 5.0  --  3.6 3.7 3.5  CL 106  --  108 104 104  CO2 21*  --  21* 22 25  GLUCOSE 119*  --  160* 161* 134*  BUN 52*  --  43* 38* 38*  CREATININE  1.84* 1.80* 1.85* 1.82* 1.39*  CALCIUM 8.7*  --  8.4* 8.2* 8.4*  MG  --   --   --  2.1 2.0   GFR: Estimated Creatinine Clearance: 65.4 mL/min (A) (by C-G formula based on SCr of 1.39 mg/dL (H)). Liver Function Tests: Recent Labs  Lab 07/10/23 1549 07/11/23 0449  AST 50* 39  ALT 54* 48*  ALKPHOS 116 115  BILITOT 1.5* 1.3*  PROT 5.2* 5.5*  ALBUMIN 2.7* 2.7*   No results for input(s): "LIPASE", "AMYLASE" in the last 168 hours. No results for input(s): "AMMONIA" in the last 168 hours. Coagulation Profile: No results for input(s): "INR", "PROTIME" in the last 168 hours. Cardiac Enzymes: No results for input(s): "CKTOTAL", "CKMB", "CKMBINDEX", "TROPONINI" in the last 168 hours. BNP (last 3 results) Recent Labs    05/28/23 0850  PROBNP 24,067*   HbA1C: No results for input(s): "HGBA1C" in the last 72 hours. CBG: Recent Labs  Lab 07/12/23 2102 07/13/23 0611 07/13/23 0841 07/13/23 1113 07/13/23 1652  GLUCAP 205* 185* 305* 243* 238*   Lipid Profile: Recent Labs    07/10/23 2036  CHOL 218*  HDL 44  LDLCALC 152*  TRIG 108  CHOLHDL 5.0   Thyroid Function Tests: Recent Labs    07/10/23 2036  TSH 4.268   Anemia Panel: No results for input(s): "VITAMINB12", "FOLATE", "FERRITIN", "TIBC", "IRON", "RETICCTPCT" in the last 72 hours. Urine analysis:    Component Value Date/Time   COLORURINE YELLOW 07/10/2023 1555   APPEARANCEUR CLEAR 07/10/2023 1555   LABSPEC 1.014 07/10/2023 1555   PHURINE 5.0 07/10/2023 1555   GLUCOSEU NEGATIVE 07/10/2023 1555   HGBUR SMALL (A) 07/10/2023 1555   BILIRUBINUR NEGATIVE 07/10/2023 1555   KETONESUR NEGATIVE 07/10/2023 1555   PROTEINUR 100 (A) 07/10/2023 1555   UROBILINOGEN 1.0 03/02/2010 2212   NITRITE NEGATIVE 07/10/2023 1555   LEUKOCYTESUR NEGATIVE 07/10/2023 1555   Sepsis Labs: @LABRCNTIP (procalcitonin:4,lacticidven:4)  ) Recent Results (from the past 240 hours)  Resp panel by RT-PCR (RSV, Flu A&B, Covid) Anterior Nasal  Swab     Status: None   Collection Time: 07/10/23  3:49 PM   Specimen: Anterior Nasal Swab  Result Value Ref Range Status   SARS Coronavirus 2 by RT PCR NEGATIVE NEGATIVE Final   Influenza A by PCR NEGATIVE NEGATIVE Final   Influenza B by PCR NEGATIVE NEGATIVE Final    Comment: (NOTE) The Xpert Xpress SARS-CoV-2/FLU/RSV plus assay is intended as an aid in the diagnosis of influenza from Nasopharyngeal swab specimens and should not be used as a sole basis for treatment. Nasal washings and aspirates are unacceptable for Xpert Xpress SARS-CoV-2/FLU/RSV testing.  Fact Sheet for Patients: BloggerCourse.com  Fact Sheet for Healthcare Providers: SeriousBroker.it  This test is not yet approved or cleared by the [place] FDA and has been authorized for detection and/or diagnosis of SARS-CoV-2 by FDA under an Emergency Use Authorization (EUA). This EUA will remain in effect (meaning this test can be used) for the duration of the COVID-19 declaration under Section 564(b)(1) of the Act, 21 U.S.C. section 360bbb-3(b)(1), unless the authorization  is terminated or revoked.     Resp Syncytial Virus by PCR NEGATIVE NEGATIVE Final    Comment: (NOTE) Fact Sheet for Patients: BloggerCourse.com  Fact Sheet for Healthcare Providers: SeriousBroker.it  This test is not yet approved or cleared by the [place] FDA and has been authorized for detection and/or diagnosis of SARS-CoV-2 by FDA under an Emergency Use Authorization (EUA). This EUA will remain in effect (meaning this test can be used) for the duration of the COVID-19 declaration under Section 564(b)(1) of the Act, 21 U.S.C. section 360bbb-3(b)(1), unless the authorization is terminated or revoked.  Performed at [facility], [address], [place], [place] [address]   MRSA Next Gen by PCR, Nasal     Status: None    Collection Time: 07/11/23  5:45 PM   Specimen: Nasal Mucosa; Nasal Swab  Result Value Ref Range Status   MRSA by PCR Next Gen NOT DETECTED NOT DETECTED Final    Comment: (NOTE) The GeneXpert MRSA Assay (FDA approved for NASAL specimens only), is one component of a comprehensive MRSA colonization surveillance program. It is not intended to diagnose MRSA infection nor to guide or monitor treatment for MRSA infections. Test performance is not FDA approved in patients less than 72 years old. Performed at [facility], [address] [place], [place] [address]      Radiology Studies: ECHOCARDIOGRAM COMPLETE BUBBLE STUDY Result Date: 07/12/2023    ECHOCARDIOGRAM REPORT   Patient Name:   [name] Date of Exam: 07/12/2023 Medical Rec #:  [id number]        Height:       74.0 in Accession #:    [id number]       Weight:       175.5 lb Date of Birth:  1966-06-11         BSA:          2.056 m Patient Age:    57 years         BP:           157/132 mmHg Patient Gender: M                HR:           82 bpm. Exam Location:  Inpatient Procedure: 2D Echo, Cardiac Doppler and Color Doppler Indications:    CHF  History:        Patient has no prior history of Echocardiogram examinations. CHF                 and Cardiomegaly, Cocaine abuse, Signs/Symptoms:Edema; Risk                 Factors:Hypertension, Diabetes, Dyslipidemia and Current Smoker.  Sonographer:    Dondra Prader RVT RCS Referring Phys: [phone] EKTA V PATEL  Sonographer Comments: Image acquisition challenging due to uncooperative patient. Patient unable to tolerate exam and refused to complete ECHO IMPRESSIONS  1. Left ventricular ejection fraction, by estimation, is 30%. The left ventricle has moderate to severely decreased function. The left ventricle demonstrates regional wall motion abnormalities with diffuse hypokinesis + basal to mid inferolateral and anterolateral akinesis. The left ventricular internal cavity size was moderately dilated.  There is mild concentric left ventricular hypertrophy. Left ventricular diastolic parameters are indeterminate.  2. Right ventricular systolic function is low normal. The right ventricular size is moderately enlarged. There is moderately elevated pulmonary artery systolic pressure. The estimated right ventricular systolic pressure is 59.9 mmHg.  3.  Left atrial size was severely dilated.  4. Right atrial size was severely dilated.  5. The mitral valve is abnormal. Moderate to severe mitral valve regurgitation, not fully visualized. The posterior leaflet was restricted. No evidence of mitral stenosis.  6. Tricuspid valve regurgitation is mild to moderate.  7. The aortic valve is tricuspid. There is mild calcification of the aortic valve. Aortic valve regurgitation is not visualized. No aortic stenosis is present.  8. The inferior vena cava is dilated in size with <50% respiratory variability, suggesting right atrial pressure of 15 mmHg. FINDINGS  Left Ventricle: Left ventricular ejection fraction, by estimation, is 30%. The left ventricle has moderate to severely decreased function. The left ventricle demonstrates regional wall motion abnormalities. The left ventricular internal cavity size was moderately dilated. There is mild concentric left ventricular hypertrophy. Left ventricular diastolic parameters are indeterminate. Right Ventricle: The right ventricular size is moderately enlarged. No increase in right ventricular wall thickness. Right ventricular systolic function is low normal. There is moderately elevated pulmonary artery systolic pressure. The tricuspid regurgitant velocity is 3.35 m/s, and with an assumed right atrial pressure of 15 mmHg, the estimated right ventricular systolic pressure is 59.9 mmHg. Left Atrium: Left atrial size was severely dilated. Right Atrium: Right atrial size was severely dilated. Pericardium: Trivial pericardial effusion is present. Mitral Valve: The mitral valve is abnormal.  Mild mitral annular calcification. Moderate to severe mitral valve regurgitation. No evidence of mitral valve stenosis. Tricuspid Valve: The tricuspid valve is normal in structure. Tricuspid valve regurgitation is mild to moderate. Aortic Valve: The aortic valve is tricuspid. There is mild calcification of the aortic valve. Aortic valve regurgitation is not visualized. No aortic stenosis is present. Aortic valve mean gradient measures 2.5 mmHg. Aortic valve peak gradient measures 5.5 mmHg. Aortic valve area, by VTI measures 2.88 cm. Pulmonic Valve: The pulmonic valve was normal in structure. Pulmonic valve regurgitation is mild. Aorta: The aortic root is normal in size and structure. Venous: The inferior vena cava is dilated in size with less than 50% respiratory variability, suggesting right atrial pressure of 15 mmHg. IAS/Shunts: No atrial level shunt detected by color flow Doppler.  LEFT VENTRICLE PLAX 2D LVIDd:         7.00 cm LVIDs:         6.00 cm LV PW:         1.00 cm LV IVS:        1.30 cm LVOT diam:     2.10 cm LV SV:         55 LV SV Index:   27 LVOT Area:     3.46 cm  LV Volumes (MOD) LV vol d, MOD A2C: 248.0 ml LV vol d, MOD A4C: 235.0 ml LV vol s, MOD A2C: 156.0 ml LV SV MOD A2C:     92.0 ml LV SV MOD A4C:     235.0 ml RIGHT VENTRICLE             IVC RV Basal diam:  5.40 cm     IVC diam: 2.40 cm RV Mid diam:    5.00 cm RV S prime:     13.00 cm/s TAPSE (M-mode): 2.4 cm LEFT ATRIUM              Index        RIGHT ATRIUM           Index LA diam:        5.50 cm  2.68 cm/m  RA Area:     32.00 cm LA Vol (A2C):   180.0 ml 87.55 ml/m  RA Volume:   128.00 ml 62.26 ml/m LA Vol (A4C):   114.2 ml 55.55 ml/m LA Biplane Vol: 173.0 ml 84.14 ml/m  AORTIC VALVE                    PULMONIC VALVE AV Area (Vmax):    2.90 cm     PV Vmax:          0.90 m/s AV Area (Vmean):   2.68 cm     PV Peak grad:     3.2 mmHg AV Area (VTI):     2.88 cm     PR End Diast Vel: 9.73 msec AV Vmax:           117.50 cm/s AV Vmean:           77.600 cm/s AV VTI:            0.192 m AV Peak Grad:      5.5 mmHg AV Mean Grad:      2.5 mmHg LVOT Vmax:         98.50 cm/s LVOT Vmean:        60.100 cm/s LVOT VTI:          0.160 m LVOT/AV VTI ratio: 0.83  AORTA Ao Root diam: 3.50 cm Ao Asc diam:  3.70 cm TRICUSPID VALVE TR Peak grad:   44.9 mmHg TR Vmax:        335.00 cm/s  SHUNTS Systemic VTI:  0.16 m Systemic Diam: 2.10 cm Dalton McleanMD Electronically signed by Wilfred Lacy Signature Date/Time: 07/12/2023/9:17:48 AM    Final      Scheduled Meds:  aspirin  81 mg Oral Daily   carvedilol  12.5 mg Oral BID WC   finasteride  5 mg Oral Daily   heparin  5,000 Units Subcutaneous Q8H   hydrALAZINE  100 mg Oral Q8H   insulin aspart  0-15 Units Subcutaneous TID WC   isosorbide mononitrate  30 mg Oral Daily   nicotine  14 mg Transdermal Daily   pantoprazole  40 mg Oral QHS   rosuvastatin  40 mg Oral Daily   sodium chloride flush  3 mL Intravenous Q12H   tamsulosin  0.8 mg Oral QHS   Continuous Infusions:   LOS: 3 days    Time spent:    Zannie Cove, MD [facility]   07/13/2023, 5:26 PM

## 2023-07-13 NOTE — Progress Notes (Signed)
Mobility Specialist Progress Note;   07/13/23 1143  Mobility  Activity Refused mobility   Pt refusing mobility at this d/t not feeling well and having a headache. Pt had lunch served at the time and encouraged pt to even sit up in the chair to eat, however still declining. Pt left in bed with all needs met.   Caesar Bookman Mobility Specialist Please contact via SecureChat or [employer] [phone]

## 2023-07-13 NOTE — Progress Notes (Signed)
PT Cancellation Note  Patient Details Name: DAINE GUNTHER MRN: [id number] DOB: 16-Jul-1965   Cancelled Treatment:    Reason Eval/Treat Not Completed: Other (comment) Upon PT arrival pt sitting EOB with lights out stating "Im not waking now. I didn't sleep at all last night and I just got comfortable. I'm straining to pee." Educated pt on importance of movement and offered to assist pt to the bathroom however pt declined. Acute PT to return as able.  Lewis Shock, PT, DPT Acute Rehabilitation Services Secure chat preferred Office #: [phone]    Iona Hansen 07/13/2023, 9:31 AM

## 2023-07-13 NOTE — Progress Notes (Signed)
Orthopedic Tech Progress Note Patient Details:  [name] Oct 19, 1965 [id number]  Ortho Devices Type of Ortho Device: Ace wrap, Unna boot Ortho Device/Splint Location: BLE Ortho Device/Splint Interventions: Ordered, Application, Adjustment   Post Interventions Patient Tolerated: Well Instructions Provided: Care of device  [name] 07/13/2023, 2:12 PM

## 2023-07-13 NOTE — Inpatient Diabetes Management (Signed)
Inpatient Diabetes Program Recommendations  AACE/ADA: New Consensus Statement on Inpatient Glycemic Control (2015)  Target Ranges:  Prepandial:   less than 140 mg/dL      Peak postprandial:   less than 180 mg/dL (1-2 hours)      Critically ill patients:  140 - 180 mg/dL   Lab Results  Component Value Date   GLUCAP 305 (H) 07/13/2023   HGBA1C 6.9 (H) 05/28/2023    Review of Glycemic Control  Latest Reference Range & Units 07/12/23 21:02 07/13/23 06:11 07/13/23 08:41  Glucose-Capillary 70 - 99 mg/dL 161 (H) 096 (H) 045 (H)   Diabetes history: DM 2 (note A1C=6.9%) Outpatient Diabetes medications:  None noted Current orders for Inpatient glycemic control:  Novolog 0-15 units tid with meals   Inpatient Diabetes Program Recommendations:    Note elevated A1C.  No oral meds listed for DM.  Would he be a candidate for SGLT-2?    Thanks,  Lorenza Cambridge, RN, BC-ADM Inpatient Diabetes Coordinator Pager [phone]  (8a-5p)

## 2023-07-13 NOTE — Telephone Encounter (Signed)
Patient Product/process development scientist completed.    The patient is insured through [employer] Patient has Medicare and is not eligible for a copay card, but may be able to apply for patient assistance or Medicare RX Payment Plan (Patient Must reach out to their plan, if eligible for payment plan), if available.    Ran test claim for Farxiga 10 mg and the current 30 day co-pay is $0.00.  Ran test claim for Jardiance 10 mg and the current 30 day co-pay is $0.00.  This test claim was processed through [facility]- copay amounts may vary at other pharmacies due to pharmacy/plan contracts, or as the patient moves through the different stages of their insurance plan.     Roland Earl, CPHT Pharmacy Technician III Certified Patient Advocate [facility] Patient Advocate Team Direct Number: [phone]  Fax: [phone]

## 2023-07-13 NOTE — Progress Notes (Addendum)
Rounding Note    Patient Name: [name] Date of Encounter: 07/13/2023  [facility] Cardiologist: Dr Tomie China  Subjective  He states he did not sleep very well.  He feels his legs are more edematous  Inpatient Medications    Scheduled Meds:  aspirin  81 mg Oral Daily   finasteride  5 mg Oral Daily   heparin  5,000 Units Subcutaneous Q8H   hydrALAZINE  100 mg Oral Q8H   insulin aspart  0-15 Units Subcutaneous TID WC   isosorbide mononitrate  30 mg Oral Daily   nicotine  14 mg Transdermal Daily   pantoprazole  40 mg Oral QHS   potassium chloride  40 mEq Oral Once   rosuvastatin  40 mg Oral Daily   sodium chloride flush  3 mL Intravenous Q12H   tamsulosin  0.4 mg Oral QHS   Continuous Infusions:  PRN Meds: acetaminophen **OR** acetaminophen, albuterol, hydrALAZINE, nitroGLYCERIN, ondansetron **OR** ondansetron (ZOFRAN) IV   Vital Signs    Vitals:   07/12/23 2245 07/13/23 0328 07/13/23 0658 07/13/23 0720  BP: (!) 147/93 (!) 146/100 (!) 157/114 (!) 149/101  Pulse: 82 93  98  Resp: 20 16  (!) 21  Temp: 98.2 F (36.8 C) 98.1 F (36.7 C)  98.1 F (36.7 C)  TempSrc: Oral Oral  Oral  SpO2: 95% 100%  99%  Weight:  78.9 kg    Height:        Intake/Output Summary (Last 24 hours) at 07/13/2023 0841 Last data filed at 07/13/2023 0657 Gross per 24 hour  Intake 1200 ml  Output 4150 ml  Net -2950 ml      07/13/2023    3:28 AM 07/12/2023    3:41 AM 07/11/2023    3:32 PM  Last 3 Weights  Weight (lbs) 173 lb 15.1 oz 175 lb 7.8 oz 172 lb 9.6 oz  Weight (kg) 78.9 kg 79.6 kg 78.291 kg      Telemetry    Sinus - Personally Reviewed   Physical Exam   GEN: No acute distress.   Neck: supple Cardiac: RRR, no murmurs, rubs, or gallops.  Respiratory: Clear to auscultation bilaterally. GI: Soft, nontender, non-distended  MS: 4+ edema Neuro:  Nonfocal  Psych: Normal affect   Labs    High Sensitivity Troponin:   Recent Labs  Lab 07/10/23 1549  07/10/23 1752  TROPONINIHS 91* 78*     Chemistry Recent Labs  Lab 07/10/23 1549 07/10/23 2036 07/11/23 0449 07/12/23 0209 07/13/23 0211  NA 137  --  138 135 138  K 5.0  --  3.6 3.7 3.5  CL 106  --  108 104 104  CO2 21*  --  21* 22 25  GLUCOSE 119*  --  160* 161* 134*  BUN 52*  --  43* 38* 38*  CREATININE 1.84*   < > 1.85* 1.82* 1.39*  CALCIUM 8.7*  --  8.4* 8.2* 8.4*  MG  --   --   --  2.1 2.0  PROT 5.2*  --  5.5*  --   --   ALBUMIN 2.7*  --  2.7*  --   --   AST 50*  --  39  --   --   ALT 54*  --  48*  --   --   ALKPHOS 116  --  115  --   --   BILITOT 1.5*  --  1.3*  --   --   GFRNONAA 42*   < >  42* 43* 59*  ANIONGAP 10  --  9 9 9    < > = values in this interval not displayed.    Lipids  Recent Labs  Lab 07/10/23 2036  CHOL 218*  TRIG 108  HDL 44  LDLCALC 152*  CHOLHDL 5.0    Hematology Recent Labs  Lab 07/10/23 1549 07/10/23 2036 07/11/23 0449  WBC 9.5 9.9 9.9  RBC 4.79 5.16 4.91  HGB 14.8 16.1 15.2  HCT 44.9 47.9 45.8  MCV 93.7 92.8 93.3  MCH 30.9 31.2 31.0  MCHC 33.0 33.6 33.2  RDW 16.6* 16.8* 16.8*  PLT 204 232 193   Thyroid  Recent Labs  Lab 07/10/23 2036  TSH 4.268    BNP Recent Labs  Lab 07/10/23 1549  BNP >4,500.0*      Radiology    ECHOCARDIOGRAM COMPLETE BUBBLE STUDY Result Date: 07/12/2023    ECHOCARDIOGRAM REPORT   Patient Name:   JOAOPEDRO ESCHBACH Date of Exam: 07/12/2023 Medical Rec #:  [id number]        Height:       74.0 in Accession #:    [id number]       Weight:       175.5 lb Date of Birth:  Oct 01, 1965         BSA:          2.056 m Patient Age:    58 years         BP:           157/132 mmHg Patient Gender: M                HR:           82 bpm. Exam Location:  Inpatient Procedure: 2D Echo, Cardiac Doppler and Color Doppler Indications:    CHF  History:        Patient has no prior history of Echocardiogram examinations. CHF                 and Cardiomegaly, Cocaine abuse, Signs/Symptoms:Edema; Risk                  Factors:Hypertension, Diabetes, Dyslipidemia and Current Smoker.  Sonographer:    Dondra Prader RVT RCS Referring Phys: [phone] EKTA V PATEL  Sonographer Comments: Image acquisition challenging due to uncooperative patient. Patient unable to tolerate exam and refused to complete ECHO IMPRESSIONS  1. Left ventricular ejection fraction, by estimation, is 30%. The left ventricle has moderate to severely decreased function. The left ventricle demonstrates regional wall motion abnormalities with diffuse hypokinesis + basal to mid inferolateral and anterolateral akinesis. The left ventricular internal cavity size was moderately dilated. There is mild concentric left ventricular hypertrophy. Left ventricular diastolic parameters are indeterminate.  2. Right ventricular systolic function is low normal. The right ventricular size is moderately enlarged. There is moderately elevated pulmonary artery systolic pressure. The estimated right ventricular systolic pressure is 59.9 mmHg.  3. Left atrial size was severely dilated.  4. Right atrial size was severely dilated.  5. The mitral valve is abnormal. Moderate to severe mitral valve regurgitation, not fully visualized. The posterior leaflet was restricted. No evidence of mitral stenosis.  6. Tricuspid valve regurgitation is mild to moderate.  7. The aortic valve is tricuspid. There is mild calcification of the aortic valve. Aortic valve regurgitation is not visualized. No aortic stenosis is present.  8. The inferior vena cava is dilated in size with <50% respiratory variability, suggesting right atrial pressure of 15  mmHg. FINDINGS  Left Ventricle: Left ventricular ejection fraction, by estimation, is 30%. The left ventricle has moderate to severely decreased function. The left ventricle demonstrates regional wall motion abnormalities. The left ventricular internal cavity size was moderately dilated. There is mild concentric left ventricular hypertrophy. Left ventricular diastolic  parameters are indeterminate. Right Ventricle: The right ventricular size is moderately enlarged. No increase in right ventricular wall thickness. Right ventricular systolic function is low normal. There is moderately elevated pulmonary artery systolic pressure. The tricuspid regurgitant velocity is 3.35 m/s, and with an assumed right atrial pressure of 15 mmHg, the estimated right ventricular systolic pressure is 59.9 mmHg. Left Atrium: Left atrial size was severely dilated. Right Atrium: Right atrial size was severely dilated. Pericardium: Trivial pericardial effusion is present. Mitral Valve: The mitral valve is abnormal. Mild mitral annular calcification. Moderate to severe mitral valve regurgitation. No evidence of mitral valve stenosis. Tricuspid Valve: The tricuspid valve is normal in structure. Tricuspid valve regurgitation is mild to moderate. Aortic Valve: The aortic valve is tricuspid. There is mild calcification of the aortic valve. Aortic valve regurgitation is not visualized. No aortic stenosis is present. Aortic valve mean gradient measures 2.5 mmHg. Aortic valve peak gradient measures 5.5 mmHg. Aortic valve area, by VTI measures 2.88 cm. Pulmonic Valve: The pulmonic valve was normal in structure. Pulmonic valve regurgitation is mild. Aorta: The aortic root is normal in size and structure. Venous: The inferior vena cava is dilated in size with less than 50% respiratory variability, suggesting right atrial pressure of 15 mmHg. IAS/Shunts: No atrial level shunt detected by color flow Doppler.  LEFT VENTRICLE PLAX 2D LVIDd:         7.00 cm LVIDs:         6.00 cm LV PW:         1.00 cm LV IVS:        1.30 cm LVOT diam:     2.10 cm LV SV:         55 LV SV Index:   27 LVOT Area:     3.46 cm  LV Volumes (MOD) LV vol d, MOD A2C: 248.0 ml LV vol d, MOD A4C: 235.0 ml LV vol s, MOD A2C: 156.0 ml LV SV MOD A2C:     92.0 ml LV SV MOD A4C:     235.0 ml RIGHT VENTRICLE             IVC RV Basal diam:  5.40 cm      IVC diam: 2.40 cm RV Mid diam:    5.00 cm RV S prime:     13.00 cm/s TAPSE (M-mode): 2.4 cm LEFT ATRIUM              Index        RIGHT ATRIUM           Index LA diam:        5.50 cm  2.68 cm/m   RA Area:     32.00 cm LA Vol (A2C):   180.0 ml 87.55 ml/m  RA Volume:   128.00 ml 62.26 ml/m LA Vol (A4C):   114.2 ml 55.55 ml/m LA Biplane Vol: 173.0 ml 84.14 ml/m  AORTIC VALVE                    PULMONIC VALVE AV Area (Vmax):    2.90 cm     PV Vmax:          0.90 m/s AV Area (Vmean):  2.68 cm     PV Peak grad:     3.2 mmHg AV Area (VTI):     2.88 cm     PR End Diast Vel: 9.73 msec AV Vmax:           117.50 cm/s AV Vmean:          77.600 cm/s AV VTI:            0.192 m AV Peak Grad:      5.5 mmHg AV Mean Grad:      2.5 mmHg LVOT Vmax:         98.50 cm/s LVOT Vmean:        60.100 cm/s LVOT VTI:          0.160 m LVOT/AV VTI ratio: 0.83  AORTA Ao Root diam: 3.50 cm Ao Asc diam:  3.70 cm TRICUSPID VALVE TR Peak grad:   44.9 mmHg TR Vmax:        335.00 cm/s  SHUNTS Systemic VTI:  0.16 m Systemic Diam: 2.10 cm Dalton McleanMD Electronically signed by Wilfred Lacy Signature Date/Time: 07/12/2023/9:17:48 AM    Final     Patient Profile     58 y.o. male with PMH of CAD, bipolar, CVA, substance abuse, hypertension, noncompliance for evaluation of acute on chronic diastolic CHF. Seen recently in the office and echo ordered but not performed.  Assessment & Plan    NSVT -K>4, Mg>2 -Agree with potassium supplementation; adding a beta-blocker  1 Ischemic Acute on chronic systolic CHF-pt has been noncompliant with meds. Significantly volume overloaded. Continue lasix 40 mg BID and follow renal function. Will consider addition of SGLT2I and spironolactone as outpt if pt compliant with FU and meds; and renal function continues to improves.   -Echo showed EF 30% with basal to mid inferior/anterior lateral akinesis -Net -2.9 L, total net -10 L; weight is down 2 pounds.  Creatinine 1.8-1.39 -continue lasix ;  diuresing well with this regimen  2 hypertension-poorly controlled.  In the setting of cocaine withdrawal -continue hydralazine 100 TID -Continue Imdur 30 mg daily -Added Coreg 12.5 mg twice daily  3 acute on chronic stage 3a kidney disease-follow renal function with diuresis.  Complicated with recent cocaine use  4 cocaine abuse-needs to avoid.   5 elevated troponin-minimal; no plans for ischemia eval at this point. He would need to demonstrate compliance with meds and FU prior to procedures.  6 H/O CAD-add ASA 81 mg daily and statin.  He is going to require at least a few days of diuresis.   Time Spent Directly with Patient:  I have spent a total of 35 minutes with the patient reviewing hospital notes, telemetry, EKGs, labs and examining the patient as well as establishing an assessment and plan that was discussed personally with the patient.     For questions or updates, please contact [facility] Please consult [email or web address] for contact info under     Signed, Maisie Fus, MD  07/13/2023, 8:41 AM

## 2023-07-13 NOTE — Progress Notes (Signed)
Progress Note   Patient: [name]:[id number] DOB: 1965/09/02 DOA: 07/10/2023     3 DOS: the patient was seen and examined on 07/13/2023   Brief hospital course: [name] was admitted to the hospital with the working diagnosis of heart failure exacerbation in the setting of cocaine intoxication.   58 yo male with the past medical history of cocaine abuse, hypertension, coronary artery disease, CVA, T2DM and bipolar disorder who presented with lower extremity edema. Apparently not adherent to medical therapy or outpatient follow up. On his initial physical examination his blood pressure was 178/130, HR 92, RR 20 and 02 saturation 100%, lungs with no wheezing or rhonchi, heart with S1 and S2 present and regular with no gallops, rubs or murmurs, abdomen with no distention and positive lower extremity edema.   Na 137, K 5,0 Cl 106 bicarbonate 21, glucose 119, bun 52 cr 1,85  AST 50 ALT 54 T Bil 1,5  BNP >4,500  High sensitive troponin 91 and 78  Wbc 9,5 hgb 14,8 plt 204  Sars covid 19 negative  Influenza negative   Urine analysis SG 1,014, protein 100, negative leukocytes and negative hgb.   Toxicology screen positive for cocaine   Chest radiograph with cardiomegaly with bilateral hilar vascular congestion, bilateral central interstitial infiltrates with cephalization in the vasculature and fluid in the right fissure.   EKG 78 bpm, normal axis, normal intervals qtc 429, sinus rhythm with poor R R wave progression, ST depression and negative T wave V5 and V6 with positive LVH.   01/27 responding to diuresis but continue very volume overloaded.  01/28 continue diuresis, starting on guideline directed medical therapy.   Assessment and Plan: * Acute on chronic systolic CHF (congestive heart failure) (HCC) Echocardiogram with reduced LV systolic function 30%, LV with moderate to severe reduction in systolic function, diffuse hypokinesis, akinesis of the basal to mid infero lateral  and antero lateral wall. Moderate LV cavity dilatation and mild LVH. RV systolic function low normal, RV with moderate enlargement, RVSP 59,9 mmHg.  LA and RA with severe dilatation. Moderate to severe MR, mild to moderate TR, trivial pericardial effusion,   Urine output is 4,150  ml Systolic blood pressure  140 mmHg range   Plan to continue diuresis with furosemide IV 40 mg bid After load reduction with hydralazine, and isosorbide.  Added carvedilol  Patient will benefit from SGLT 2 inh  Add Unna boots No RAAS inhibition due to acutely reduced GFR.   Telemetry with non sustained VT Keep K and MG, K at 4 and Mg 2.  CAD (coronary artery disease) Continue aspirin and statin  No chest pain.  High sensitive troponin elevation due to heart failure, ruled out acute coronary syndrome.   Hypertension Continue aggressive diuresis, and hydralazine and isosorbide. Holding clonidine due to rebound effects when non adherence.  Added carvedilol Continue blood pressure monitoring.     AKI (acute kidney injury) (HCC) Improved volume status but not back to baseline, renal function today with serum cr at 1,3 with K at 3,5 and serum bicarbonate at 25  Na 138 Mg 2.0  Plan to continue diuresis with furosemide.  Add 40 meq Kcl x2 Follow up renal function and electrolytes in am.   DM II (diabetes mellitus, type II), controlled (HCC) Continue glucose cover and monitoring with insulin sliding scale.  Patient is tolerating po well.   Anxiety Continue buspirone.   Cigarette smoker Nicotine patch counseling once patient is stable.  Cocaine abuse (HCC)  Counseling and TOC consult for substance abuse resource.  GERD (gastroesophageal reflux disease) Continue pantoprazole.   History of stroke History of CVA secondary to essential hypertension, polysubstance abuse and medical noncompliance. Currently patient at baseline and is nonfocal.   Protein-calorie malnutrition, severe Continue with  nutritional supplements.   Bilateral lower extremity edema Patient presenting with bilateral lower extremity edema extending from his legs to his groin and lower abdomen anasarca picture, elevated BNP of 4500, judging from his clinical features suspect a combination of systolic and diastolic congestive heart failure no recent echoes, last echocardiogram in our chart is in 2011 where his function was noted to be within normal limits. Lasix 40 mg bid x 2 doses.   Elevated LFTs Transaminitis secondary to passive hepatic congestion from CHF exacerbation. Will monitor low threshold for liver ultrasound.   CVA (cerebral vascular accident) [facility]) History of CVA secondary to essential hypertension, polysubstance abuse and medical noncompliance. Currently patient at baseline and is nonfocal. Will DC patient on aspirin and statin therapy.        Subjective: Patient continue feeling better but continue to have lower extremity edema.  No PND   Physical Exam: Vitals:   07/12/23 2245 07/13/23 0328 07/13/23 0658 07/13/23 0720  BP: (!) 147/93 (!) 146/100 (!) 157/114 (!) 149/101  Pulse: 82 93  98  Resp: 20 16  (!) 21  Temp: 98.2 F (36.8 C) 98.1 F (36.7 C)  98.1 F (36.7 C)  TempSrc: Oral Oral  Oral  SpO2: 95% 100%  99%  Weight:  78.9 kg    Height:       Neurology awake and alert ENT with mild pallor Cardiovascular with S1 and S2 present and regular, positive S3 gallop, positive systolic murmur at the apex Mild JVD Positive lower extremity edema +++ pitting Respiratory with mild rales at bases with no wheezing or rhonchi Abdomen with no distention  Data Reviewed:    Family Communication: no family at the bedside   Disposition: Status is: Inpatient Remains inpatient appropriate because: IV diuresis   Planned Discharge Destination: Home     Author: Coralie Keens, MD 07/13/2023 10:51 AM  For on call review [email or web address].

## 2023-07-13 NOTE — Plan of Care (Signed)

## 2023-07-13 NOTE — Care Management Important Message (Signed)
Important Message  Patient Details  Name: [name] MRN: [id number] Date of Birth: [birth year]   Important Message Given:  Yes - Medicare IM     Dorena Bodo 07/13/2023, 2:48 PM

## 2023-07-14 DIAGNOSIS — I5033 Acute on chronic diastolic (congestive) heart failure: Secondary | ICD-10-CM | POA: Diagnosis not present

## 2023-07-14 DIAGNOSIS — I5023 Acute on chronic systolic (congestive) heart failure: Secondary | ICD-10-CM | POA: Diagnosis not present

## 2023-07-14 LAB — GLUCOSE, CAPILLARY
Glucose-Capillary: 140 mg/dL — ABNORMAL HIGH (ref 70–99)
Glucose-Capillary: 190 mg/dL — ABNORMAL HIGH (ref 70–99)
Glucose-Capillary: 198 mg/dL — ABNORMAL HIGH (ref 70–99)
Glucose-Capillary: 131 mg/dL — ABNORMAL HIGH (ref 70–99)

## 2023-07-14 LAB — BASIC METABOLIC PANEL
Anion gap: 8 (ref 5–15)
BUN: 39 mg/dL — ABNORMAL HIGH (ref 6–20)
CO2: 23 mmol/L (ref 22–32)
Calcium: 7.9 mg/dL — ABNORMAL LOW (ref 8.9–10.3)
Chloride: 104 mmol/L (ref 98–111)
Creatinine, Ser: 1.43 mg/dL — ABNORMAL HIGH (ref 0.61–1.24)
GFR, Estimated: 57 mL/min — ABNORMAL LOW (ref >60)
Glucose, Bld: 157 mg/dL — ABNORMAL HIGH (ref 70–99)
Potassium: 3.6 mmol/L (ref 3.5–5.1)
Sodium: 135 mmol/L (ref 135–145)

## 2023-07-14 LAB — MAGNESIUM: Magnesium: 2 mg/dL (ref 1.7–2.4)

## 2023-07-14 MED ORDER — ISOSORBIDE MONONITRATE ER 30 MG PO TB24
15.0000 mg | ORAL_TABLET | Freq: Every day | ORAL | Status: DC
  Administered 2023-07-15: 15 mg via ORAL
  Filled 2023-07-14: qty 1

## 2023-07-14 MED ORDER — HYDRALAZINE HCL 50 MG PO TABS
50.0000 mg | ORAL_TABLET | Freq: Three times a day (TID) | ORAL | Status: DC
  Administered 2023-07-14 – 2023-07-15 (×3): 50 mg via ORAL
  Filled 2023-07-14 (×3): qty 1

## 2023-07-14 MED ORDER — BOOST / RESOURCE BREEZE PO LIQD CUSTOM
1.0000 | Freq: Three times a day (TID) | ORAL | Status: DC
Start: 2023-07-14 — End: 2023-07-19
  Administered 2023-07-15 – 2023-07-16 (×2): 1 via ORAL

## 2023-07-14 MED ORDER — POTASSIUM CHLORIDE CRYS ER 20 MEQ PO TBCR
40.0000 meq | EXTENDED_RELEASE_TABLET | Freq: Once | ORAL | Status: AC
Start: 2023-07-14 — End: 2023-07-14
  Administered 2023-07-14: 40 meq via ORAL
  Filled 2023-07-14: qty 2

## 2023-07-14 MED ORDER — FUROSEMIDE 10 MG/ML IJ SOLN
80.0000 mg | Freq: Two times a day (BID) | INTRAMUSCULAR | Status: DC
Start: 2023-07-14 — End: 2023-07-20
  Administered 2023-07-14 – 2023-07-19 (×12): 80 mg via INTRAVENOUS
  Filled 2023-07-14 (×12): qty 8

## 2023-07-14 MED ORDER — EMPAGLIFLOZIN 10 MG PO TABS
10.0000 mg | ORAL_TABLET | Freq: Every day | ORAL | Status: DC
  Administered 2023-07-14 – 2023-07-22 (×9): 10 mg via ORAL
  Filled 2023-07-14 (×9): qty 1

## 2023-07-14 NOTE — Progress Notes (Signed)
PT Cancellation Note  Patient Details Name: [name] MRN: [id number] DOB: 1966-02-09   Cancelled Treatment:    Reason Eval/Treat Not Completed: Other (comment). Upon PT arrival pt stated, "I'm pissed. They changed my diet and didn't tell me. I normally eat at 1130 and now it's 1230."  PT offered to rectify situation and find food to hold him over until his tray arrived however pt repeatedly stating "I'm pissed." Pt also stated "oh you're will PT, I already walked today and now I'm pissed so I'm not walking again." As seen in chart pt did amb with mobility specialist earlier today. Acute PT to return as able to progress ambulation.  Lewis Shock, PT, DPT Acute Rehabilitation Services Secure chat preferred Office #: [phone]    Iona Hansen 07/14/2023, 1:04 PM

## 2023-07-14 NOTE — Progress Notes (Signed)
Rounding Note    Patient Name: [name] Date of Encounter: 07/14/2023  [facility] Cardiologist: Dr Tomie China  Subjective  His legs are wrapped but remain edematous. His albumin is low.  Inpatient Medications    Scheduled Meds:  aspirin  81 mg Oral Daily   carvedilol  12.5 mg Oral BID WC   empagliflozin  10 mg Oral Daily   finasteride  5 mg Oral Daily   furosemide  80 mg Intravenous BID   heparin  5,000 Units Subcutaneous Q8H   hydrALAZINE  100 mg Oral Q8H   insulin aspart  0-15 Units Subcutaneous TID WC   isosorbide mononitrate  30 mg Oral Daily   nicotine  14 mg Transdermal Daily   pantoprazole  40 mg Oral QHS   potassium chloride  40 mEq Oral Once   rosuvastatin  40 mg Oral Daily   sodium chloride flush  3 mL Intravenous Q12H   tamsulosin  0.8 mg Oral QHS   Continuous Infusions:  PRN Meds: acetaminophen **OR** acetaminophen, albuterol, hydrALAZINE, nitroGLYCERIN, ondansetron **OR** ondansetron (ZOFRAN) IV   Vital Signs    Vitals:   07/14/23 0002 07/14/23 0315 07/14/23 0500 07/14/23 0745  BP: 132/83 122/81  (!) 145/96  Pulse: 77 75  77  Resp: 19 17  18   Temp: 97.6 F (36.4 C) 98.2 F (36.8 C)  98 F (36.7 C)  TempSrc: Oral Oral  Oral  SpO2: 97% 98%  98%  Weight:   83 kg   Height:        Intake/Output Summary (Last 24 hours) at 07/14/2023 0842 Last data filed at 07/14/2023 0746 Gross per 24 hour  Intake 1080 ml  Output 375 ml  Net 705 ml      07/14/2023    5:00 AM 07/13/2023    3:28 AM 07/12/2023    3:41 AM  Last 3 Weights  Weight (lbs) 182 lb 15.7 oz 173 lb 15.1 oz 175 lb 7.8 oz  Weight (kg) 83 kg 78.9 kg 79.6 kg      Telemetry    Sinus - Personally Reviewed   Physical Exam   GEN: No acute distress.  malnourished Neck: supple Cardiac: RRR, no murmurs, rubs, or gallops.  Respiratory: Clear to auscultation bilaterally. GI: Soft, nontender, non-distended  MS: 4+ edema Neuro:  Nonfocal  Psych: Normal affect   Labs     High Sensitivity Troponin:   Recent Labs  Lab 07/10/23 1549 07/10/23 1752  TROPONINIHS 91* 78*     Chemistry Recent Labs  Lab 07/10/23 1549 07/10/23 2036 07/11/23 0449 07/12/23 0209 07/13/23 0211 07/14/23 0214  NA 137  --  138 135 138 135  K 5.0  --  3.6 3.7 3.5 3.6  CL 106  --  108 104 104 104  CO2 21*  --  21* 22 25 23   GLUCOSE 119*  --  160* 161* 134* 157*  BUN 52*  --  43* 38* 38* 39*  CREATININE 1.84*   < > 1.85* 1.82* 1.39* 1.43*  CALCIUM 8.7*  --  8.4* 8.2* 8.4* 7.9*  MG  --   --   --  2.1 2.0 2.0  PROT 5.2*  --  5.5*  --   --   --   ALBUMIN 2.7*  --  2.7*  --   --   --   AST 50*  --  39  --   --   --   ALT 54*  --  48*  --   --   --  ALKPHOS 116  --  115  --   --   --   BILITOT 1.5*  --  1.3*  --   --   --   GFRNONAA 42*   < > 42* 43* 59* 57*  ANIONGAP 10  --  9 9 9 8    < > = values in this interval not displayed.    Lipids  Recent Labs  Lab 07/10/23 2036  CHOL 218*  TRIG 108  HDL 44  LDLCALC 152*  CHOLHDL 5.0    Hematology Recent Labs  Lab 07/10/23 1549 07/10/23 2036 07/11/23 0449  WBC 9.5 9.9 9.9  RBC 4.79 5.16 4.91  HGB 14.8 16.1 15.2  HCT 44.9 47.9 45.8  MCV 93.7 92.8 93.3  MCH 30.9 31.2 31.0  MCHC 33.0 33.6 33.2  RDW 16.6* 16.8* 16.8*  PLT 204 232 193   Thyroid  Recent Labs  Lab 07/10/23 2036  TSH 4.268    BNP Recent Labs  Lab 07/10/23 1549  BNP >4,500.0*      Radiology    ECHOCARDIOGRAM COMPLETE BUBBLE STUDY Result Date: 07/12/2023    ECHOCARDIOGRAM REPORT   Patient Name:   [name] Date of Exam: 07/12/2023 Medical Rec #:  [id number]        Height:       74.0 in Accession #:    [id number]       Weight:       175.5 lb Date of Birth:  August 05, 1965         BSA:          2.056 m Patient Age:    58 years         BP:           157/132 mmHg Patient Gender: M                HR:           82 bpm. Exam Location:  Inpatient Procedure: 2D Echo, Cardiac Doppler and Color Doppler Indications:    CHF  History:        Patient has no  prior history of Echocardiogram examinations. CHF                 and Cardiomegaly, Cocaine abuse, Signs/Symptoms:Edema; Risk                 Factors:Hypertension, Diabetes, Dyslipidemia and Current Smoker.  Sonographer:    Dondra Prader RVT RCS Referring Phys: [phone] EKTA V PATEL  Sonographer Comments: Image acquisition challenging due to uncooperative patient. Patient unable to tolerate exam and refused to complete ECHO IMPRESSIONS  1. Left ventricular ejection fraction, by estimation, is 30%. The left ventricle has moderate to severely decreased function. The left ventricle demonstrates regional wall motion abnormalities with diffuse hypokinesis + basal to mid inferolateral and anterolateral akinesis. The left ventricular internal cavity size was moderately dilated. There is mild concentric left ventricular hypertrophy. Left ventricular diastolic parameters are indeterminate.  2. Right ventricular systolic function is low normal. The right ventricular size is moderately enlarged. There is moderately elevated pulmonary artery systolic pressure. The estimated right ventricular systolic pressure is 59.9 mmHg.  3. Left atrial size was severely dilated.  4. Right atrial size was severely dilated.  5. The mitral valve is abnormal. Moderate to severe mitral valve regurgitation, not fully visualized. The posterior leaflet was restricted. No evidence of mitral stenosis.  6. Tricuspid valve regurgitation is mild to moderate.  7. The aortic valve is  tricuspid. There is mild calcification of the aortic valve. Aortic valve regurgitation is not visualized. No aortic stenosis is present.  8. The inferior vena cava is dilated in size with <50% respiratory variability, suggesting right atrial pressure of 15 mmHg. FINDINGS  Left Ventricle: Left ventricular ejection fraction, by estimation, is 30%. The left ventricle has moderate to severely decreased function. The left ventricle demonstrates regional wall motion abnormalities. The  left ventricular internal cavity size was moderately dilated. There is mild concentric left ventricular hypertrophy. Left ventricular diastolic parameters are indeterminate. Right Ventricle: The right ventricular size is moderately enlarged. No increase in right ventricular wall thickness. Right ventricular systolic function is low normal. There is moderately elevated pulmonary artery systolic pressure. The tricuspid regurgitant velocity is 3.35 m/s, and with an assumed right atrial pressure of 15 mmHg, the estimated right ventricular systolic pressure is 59.9 mmHg. Left Atrium: Left atrial size was severely dilated. Right Atrium: Right atrial size was severely dilated. Pericardium: Trivial pericardial effusion is present. Mitral Valve: The mitral valve is abnormal. Mild mitral annular calcification. Moderate to severe mitral valve regurgitation. No evidence of mitral valve stenosis. Tricuspid Valve: The tricuspid valve is normal in structure. Tricuspid valve regurgitation is mild to moderate. Aortic Valve: The aortic valve is tricuspid. There is mild calcification of the aortic valve. Aortic valve regurgitation is not visualized. No aortic stenosis is present. Aortic valve mean gradient measures 2.5 mmHg. Aortic valve peak gradient measures 5.5 mmHg. Aortic valve area, by VTI measures 2.88 cm. Pulmonic Valve: The pulmonic valve was normal in structure. Pulmonic valve regurgitation is mild. Aorta: The aortic root is normal in size and structure. Venous: The inferior vena cava is dilated in size with less than 50% respiratory variability, suggesting right atrial pressure of 15 mmHg. IAS/Shunts: No atrial level shunt detected by color flow Doppler.  LEFT VENTRICLE PLAX 2D LVIDd:         7.00 cm LVIDs:         6.00 cm LV PW:         1.00 cm LV IVS:        1.30 cm LVOT diam:     2.10 cm LV SV:         55 LV SV Index:   27 LVOT Area:     3.46 cm  LV Volumes (MOD) LV vol d, MOD A2C: 248.0 ml LV vol d, MOD A4C: 235.0 ml  LV vol s, MOD A2C: 156.0 ml LV SV MOD A2C:     92.0 ml LV SV MOD A4C:     235.0 ml RIGHT VENTRICLE             IVC RV Basal diam:  5.40 cm     IVC diam: 2.40 cm RV Mid diam:    5.00 cm RV S prime:     13.00 cm/s TAPSE (M-mode): 2.4 cm LEFT ATRIUM              Index        RIGHT ATRIUM           Index LA diam:        5.50 cm  2.68 cm/m   RA Area:     32.00 cm LA Vol (A2C):   180.0 ml 87.55 ml/m  RA Volume:   128.00 ml 62.26 ml/m LA Vol (A4C):   114.2 ml 55.55 ml/m LA Biplane Vol: 173.0 ml 84.14 ml/m  AORTIC VALVE  PULMONIC VALVE AV Area (Vmax):    2.90 cm     PV Vmax:          0.90 m/s AV Area (Vmean):   2.68 cm     PV Peak grad:     3.2 mmHg AV Area (VTI):     2.88 cm     PR End Diast Vel: 9.73 msec AV Vmax:           117.50 cm/s AV Vmean:          77.600 cm/s AV VTI:            0.192 m AV Peak Grad:      5.5 mmHg AV Mean Grad:      2.5 mmHg LVOT Vmax:         98.50 cm/s LVOT Vmean:        60.100 cm/s LVOT VTI:          0.160 m LVOT/AV VTI ratio: 0.83  AORTA Ao Root diam: 3.50 cm Ao Asc diam:  3.70 cm TRICUSPID VALVE TR Peak grad:   44.9 mmHg TR Vmax:        335.00 cm/s  SHUNTS Systemic VTI:  0.16 m Systemic Diam: 2.10 cm Dalton McleanMD Electronically signed by Wilfred Lacy Signature Date/Time: 07/12/2023/9:17:48 AM    Final     Patient Profile     58 y.o. male with PMH of CAD, bipolar, CVA, substance abuse, hypertension, noncompliance for evaluation of acute on chronic diastolic CHF. Seen recently in the office and echo ordered but not performed.  Assessment & Plan    NSVT -K>4, Mg>2 -Agree with potassium supplementation; continue beta-blocker  1 Ischemic Acute on chronic systolic CHF-pt has been noncompliant with meds. Significantly volume overloaded.  -Echo showed EF 30% with basal to mid inferior/anterior lateral akinesis -Net +465 L, total net -9.6 L; weight is up dont think # is accurate.  Creatinine 1.8-1.39->1.43 -Agree with increase Lasix 80 mg BID IV -Will  consider addition of SGLT2I and spironolactone as outpt if pt compliant with FU and meds; and renal function continues to improves.    Malnutrition -Added protein supplementation, he is malnourished with low albumin making diuresis difficult  2 hypertension-  In the setting of cocaine withdrawal Better controlled -continue hydralazine 100 TID -Continue Imdur 30 mg daily -Continue Coreg 12.5 mg twice daily  3 acute on chronic stage 3a kidney disease-follow renal function with diuresis.  Complicated with recent cocaine use  4 cocaine abuse-needs to avoid.   5 elevated troponin-minimal; no plans for ischemia eval at this point. He would need to demonstrate compliance with meds and FU prior to procedures.  6 H/O CAD-add ASA 81 mg daily and statin.  Recommend continued diuresis with his current regimen unchanged.  If his creatinine continues to uptrend upward then can switch to lasix 80 mg oral daily. Will follow peripherally  Time Spent Directly with Patient:  I have spent a total of 35 minutes with the patient reviewing hospital notes, telemetry, EKGs, labs and examining the patient as well as establishing an assessment and plan that was discussed personally with the patient.     For questions or updates, please contact [facility] Please consult [email or web address] for contact info under     Signed, Maisie Fus, MD  07/14/2023, 8:42 AM

## 2023-07-14 NOTE — Progress Notes (Signed)
Mobility Specialist Progress Note:    07/14/23 1202  Mobility  Activity Ambulated with assistance in hallway  Level of Assistance Standby assist, set-up cues, supervision of patient - no hands on  Assistive Device Front wheel walker  Distance Ambulated (ft) 120 ft  Activity Response Tolerated well  Mobility Referral Yes  Mobility visit 1 Mobility  Mobility Specialist Start Time (ACUTE ONLY) 0915  Mobility Specialist Stop Time (ACUTE ONLY) 0926  Mobility Specialist Time Calculation (min) (ACUTE ONLY) 11 min   Received pt sitting EOB having no complaints and agreeable to mobility. Pt was asymptomatic throughout ambulation and returned to room w/o fault. Left seated EOB w/ call bell in reach and all needs met.   Thompson Grayer Mobility Specialist  Please contact vis Secure Chat or  Rehab Office [phone]

## 2023-07-14 NOTE — Progress Notes (Signed)
   Heart Failure Stewardship Pharmacist Progress Note   PCP: Donell Beers, FNP PCP-Cardiologist: None    HPI:  58 yo M with PMH of CHF, HTN, CVA, hepatitis C, CAD, bipolar disorder, history of cocaine use, and medical noncompliance.   Presented to the ED on 1/25 with shortness of breath, LEE, and scrotal edema for 2 months. BNP >4500, trop 91>78. UDS positive for cocaine on admission. CXR with low lung volumes, pulmonary edema, and cardiomegaly. ECHO 1/27 showed LVEF 30%, RWMA with diffuse hypokinesis, mild concentric LVH, RV low normal, moderately elevated PA pressure, moderate to severe MR.    Reports that he is still feeling short of breath at rest and on exertion. Feels that his LE edema is getting worse even with having UNNA boots placed yesterday.   Current HF Medications: Diuretic: furosemide 80 mg IV BID Beta Blocker: carvedilol 12.5 mg BID SGLT2i: Jardiance 10 mg daily Other: hydralazine 100 mg TID + Imdur 30 mg daily  Prior to admission HF Medications: Diuretic: furosemide 40 mg daily *also prescribed amlodipine 10 mg daily, hydrochlorothiazide 25 mg daily, and clonidine 0.2 mg BID - although noncompliant  Pertinent Lab Values: Serum creatinine 1.43, BUN 39, Potassium 3.6, Sodium 135, BNP >4500, Magnesium 2.0, A1c 6.9   Vital Signs: Weight: 182 lbs (admission weight: 175 lbs) Blood pressure: 120-140/80s  Heart rate: 70-80s  I/O: net +0.5L yesterday; net -9.5L since admission  Medication Assistance / Insurance Benefits Check: Does the patient have prescription insurance?  Yes Type of insurance plan: [place] Medicaid  Outpatient Pharmacy:  Prior to admission outpatient [facility] Is the patient willing to use [facility] pharmacy at discharge? Yes Is the patient willing to transition their outpatient pharmacy to utilize a [facility] outpatient pharmacy?   No    Assessment: 1. Acute on chronic systolic CHF (LVEF 30%). NYHA class III symptoms. - Agree with  increasing to furosemide 80 mg IV BID. Unna boots placed 1/28. Creatinine relatively stable from yesterday and improved since admission. Strict I/Os and daily weights. Keep K>4 and Mg>2. - Continue carvedilol 12.5 mg BID - Consider adding Entresto and spironolactone pending improvement in renal function - Continue Jardiance 10 mg daily - Continue hydralazine 100 mg TID + Imdur 30 mg daily - Would not resume amlodipine, hydrochlorothiazide, or clonidine at discharge   Plan: 1) Medication changes recommended at this time: - Continue IV diuresis  2) Patient assistance: - Farxiga/Jardiance copay $0  3)  Education  - Initial education completed - Full education to be completed prior to discharge  Sharen Hones, PharmD, BCPS Heart Failure Engineer, building services Phone [phone]

## 2023-07-15 DIAGNOSIS — I5023 Acute on chronic systolic (congestive) heart failure: Secondary | ICD-10-CM | POA: Diagnosis not present

## 2023-07-15 LAB — COMPREHENSIVE METABOLIC PANEL
ALT: 28 U/L (ref 0–44)
AST: 31 U/L (ref 15–41)
Albumin: 2.5 g/dL — ABNORMAL LOW (ref 3.5–5.0)
Alkaline Phosphatase: 84 U/L (ref 38–126)
Anion gap: 12 (ref 5–15)
BUN: 43 mg/dL — ABNORMAL HIGH (ref 6–20)
CO2: 20 mmol/L — ABNORMAL LOW (ref 22–32)
Calcium: 8.1 mg/dL — ABNORMAL LOW (ref 8.9–10.3)
Chloride: 104 mmol/L (ref 98–111)
Creatinine, Ser: 1.56 mg/dL — ABNORMAL HIGH (ref 0.61–1.24)
GFR, Estimated: 51 mL/min — ABNORMAL LOW (ref >60)
Glucose, Bld: 111 mg/dL — ABNORMAL HIGH (ref 70–99)
Potassium: 4.1 mmol/L (ref 3.5–5.1)
Sodium: 136 mmol/L (ref 135–145)
Total Bilirubin: 1.1 mg/dL (ref 0.0–1.2)
Total Protein: 5.5 g/dL — ABNORMAL LOW (ref 6.5–8.1)

## 2023-07-15 LAB — GLUCOSE, CAPILLARY
Glucose-Capillary: 143 mg/dL — ABNORMAL HIGH (ref 70–99)
Glucose-Capillary: 102 mg/dL — ABNORMAL HIGH (ref 70–99)
Glucose-Capillary: 102 mg/dL — ABNORMAL HIGH (ref 70–99)
Glucose-Capillary: 205 mg/dL — ABNORMAL HIGH (ref 70–99)

## 2023-07-15 LAB — CBC
HCT: 42.3 % (ref 39.0–52.0)
Hemoglobin: 13.7 g/dL (ref 13.0–17.0)
MCH: 30.2 pg (ref 26.0–34.0)
MCHC: 32.4 g/dL (ref 30.0–36.0)
MCV: 93.2 fL (ref 80.0–100.0)
Platelets: 190 10*3/uL (ref 150–400)
RBC: 4.54 MIL/uL (ref 4.22–5.81)
RDW: 16.5 % — ABNORMAL HIGH (ref 11.5–15.5)
WBC: 9.8 10*3/uL (ref 4.0–10.5)
nRBC: 0 % (ref 0.0–0.2)

## 2023-07-15 LAB — PROTIME-INR
INR: 1 (ref 0.8–1.2)
Prothrombin Time: 13.8 s (ref 11.4–15.2)

## 2023-07-15 MED ORDER — HYDRALAZINE HCL 10 MG PO TABS
10.0000 mg | ORAL_TABLET | Freq: Three times a day (TID) | ORAL | Status: DC
  Administered 2023-07-15 – 2023-07-16 (×3): 10 mg via ORAL
  Filled 2023-07-15 (×3): qty 1

## 2023-07-15 NOTE — Plan of Care (Signed)
No changes with the plan from a cardiology perspective.  Can continue diuresis until has some improvement with his lower extremity edema and can ambulate.  Creatinine relatively stable for now. For NSVT, continue to replete K. Continue BB.

## 2023-07-15 NOTE — Progress Notes (Signed)
PROGRESS NOTE    [name]  BJY:[id number] DOB: 29-Oct-1965 DOA: 07/10/2023 PCP: Donell Beers, FNP  57/M w/ history of cocaine abuse, hypertension, CAD, CVA, T2DM and bipolar disorder who presented with lower extremity edema. Noncompliant w medical therapy or outpatient follow up. IN ED, Hypertensive, positive lower extremity edema. bun 52 cr 1,85 , BNP >4,500, troponin 91 and 78  Toxicology screen positive for cocaine   CXR wcardiomegaly with bilateral hilar vascular congestion, bilateral central interstitial infiltrates with cephalization in the vasculature and fluid in the right fissure.  -1/27 responding to diuresis but remains volume overloaded  Subjective: Feels better overall, diuresed more yesterday  Assessment and Plan:  Acute on chronic systolic CHF  -Echo with reduced LV systolic function 30%, LV with moderate to severe reduction in systolic function, diffuse hypokinesis, akinesis of the basal to mid infero lateral and antero lateral wall. RV systolic function low normal, Moderate to severe MR,  - 13.5 L negative, remains volume overloaded, continue Lasix 80 Mg twice daily, Jardiance -Add Aldactone tomorrow if kidney function is stable -Continue Unna boots -Blood pressure trending down will cut down hydralazine dose and discontinue Imdur -Cards following, no plan for ischemic workup at this time  Hypoalbuminemia -Unclear if he has liver disease  CAD (coronary artery disease) Continue aspirin and statin  High sensitive troponin elevation due to heart failure, ruled out acute coronary syndrome.   Hypertension Meds as above  AKI (acute kidney injury) (HCC) -Likely cardiorenal, improving with diuresis  DM II (diabetes mellitus, type II), controlled (HCC) Improving, continue SSI, Jardiance  Anxiety Continue buspirone.   Cigarette smoker Nicotine patch counseling once patient is stable.  Cocaine abuse (HCC) Counseled  GERD (gastroesophageal reflux  disease) Continue pantoprazole.   Protein-calorie malnutrition, severe Continue with nutritional supplements.   Elevated LFTs Transaminitis secondary to passive hepatic congestion from CHF exacerbation.  CVA (cerebral vascular accident) (HCC) Add aspirin and statin at discharge  DVT prophylaxis: Hep SQ Code Status: Full Code Family Communication: None present Disposition Plan: Home likely 48 hours  Consultants:    Procedures:   Antimicrobials:    Objective: Vitals:   07/14/23 2358 07/15/23 0532 07/15/23 0758 07/15/23 1109  BP: 120/74 132/89 121/79 102/69  Pulse: 74 69 64 74  Resp: 20 (!) 21 19 17   Temp: 98 F (36.7 C) 97.8 F (36.6 C) 97.8 F (36.6 C) 97.6 F (36.4 C)  TempSrc: Oral Oral Oral Oral  SpO2: 93% 98% 98% 97%  Weight:  81.6 kg    Height:        Intake/Output Summary (Last 24 hours) at 07/15/2023 1119 Last data filed at 07/15/2023 1117 Gross per 24 hour  Intake 1640 ml  Output 7700 ml  Net -6060 ml   Filed Weights   07/13/23 0328 07/14/23 0500 07/15/23 0532  Weight: 78.9 kg 83 kg 81.6 kg    Examination:  General exam: Appears calm and comfortable HEENT: Positive JVD CVS: S1-S2, regular rhythm Lungs: Decreased breath sounds at the bases Abd: nondistended, soft and nontender.Normal bowel sounds heard. Central nervous system: Alert and oriented. No focal neurological deficits. Extremities: 2+ edema Skin: No rashes Psychiatry:  Mood & affect appropriate.     Data Reviewed:   CBC: Recent Labs  Lab 07/10/23 1549 07/10/23 2036 07/11/23 0449 07/15/23 0218  WBC 9.5 9.9 9.9 9.8  HGB 14.8 16.1 15.2 13.7  HCT 44.9 47.9 45.8 42.3  MCV 93.7 92.8 93.3 93.2  PLT 204 232 193 190   Basic  Metabolic Panel: Recent Labs  Lab 07/11/23 0449 07/12/23 0209 07/13/23 0211 07/14/23 0214 07/15/23 0218  NA 138 135 138 135 136  K 3.6 3.7 3.5 3.6 4.1  CL 108 104 104 104 104  CO2 21* 22 25 23  20*  GLUCOSE 160* 161* 134* 157* 111*  BUN 43* 38* 38*  39* 43*  CREATININE 1.85* 1.82* 1.39* 1.43* 1.56*  CALCIUM 8.4* 8.2* 8.4* 7.9* 8.1*  MG  --  2.1 2.0 2.0  --    GFR: Estimated Creatinine Clearance: 60.3 mL/min (A) (by C-G formula based on SCr of 1.56 mg/dL (H)). Liver Function Tests: Recent Labs  Lab 07/10/23 1549 07/11/23 0449 07/15/23 0218  AST 50* 39 31  ALT 54* 48* 28  ALKPHOS 116 115 84  BILITOT 1.5* 1.3* 1.1  PROT 5.2* 5.5* 5.5*  ALBUMIN 2.7* 2.7* 2.5*   No results for input(s): "LIPASE", "AMYLASE" in the last 168 hours. No results for input(s): "AMMONIA" in the last 168 hours. Coagulation Profile: Recent Labs  Lab 07/15/23 0218  INR 1.0   Cardiac Enzymes: No results for input(s): "CKTOTAL", "CKMB", "CKMBINDEX", "TROPONINI" in the last 168 hours. BNP (last 3 results) Recent Labs    05/28/23 0850  PROBNP 24,067*   HbA1C: No results for input(s): "HGBA1C" in the last 72 hours. CBG: Recent Labs  Lab 07/14/23 0616 07/14/23 1128 07/14/23 1625 07/14/23 2104 07/15/23 0610  GLUCAP 140* 190* 198* 131* 143*   Lipid Profile: No results for input(s): "CHOL", "HDL", "LDLCALC", "TRIG", "CHOLHDL", "LDLDIRECT" in the last 72 hours.  Thyroid Function Tests: No results for input(s): "TSH", "T4TOTAL", "FREET4", "T3FREE", "THYROIDAB" in the last 72 hours.  Anemia Panel: No results for input(s): "VITAMINB12", "FOLATE", "FERRITIN", "TIBC", "IRON", "RETICCTPCT" in the last 72 hours. Urine analysis:    Component Value Date/Time   COLORURINE YELLOW 07/10/2023 1555   APPEARANCEUR CLEAR 07/10/2023 1555   LABSPEC 1.014 07/10/2023 1555   PHURINE 5.0 07/10/2023 1555   GLUCOSEU NEGATIVE 07/10/2023 1555   HGBUR SMALL (A) 07/10/2023 1555   BILIRUBINUR NEGATIVE 07/10/2023 1555   KETONESUR NEGATIVE 07/10/2023 1555   PROTEINUR 100 (A) 07/10/2023 1555   UROBILINOGEN 1.0 03/02/2010 2212   NITRITE NEGATIVE 07/10/2023 1555   LEUKOCYTESUR NEGATIVE 07/10/2023 1555   Sepsis  Labs: @LABRCNTIP (procalcitonin:4,lacticidven:4)  ) Recent Results (from the past 240 hours)  Resp panel by RT-PCR (RSV, Flu A&B, Covid) Anterior Nasal Swab     Status: None   Collection Time: 07/10/23  3:49 PM   Specimen: Anterior Nasal Swab  Result Value Ref Range Status   SARS Coronavirus 2 by RT PCR NEGATIVE NEGATIVE Final   Influenza A by PCR NEGATIVE NEGATIVE Final   Influenza B by PCR NEGATIVE NEGATIVE Final    Comment: (NOTE) The Xpert Xpress SARS-CoV-2/FLU/RSV plus assay is intended as an aid in the diagnosis of influenza from Nasopharyngeal swab specimens and should not be used as a sole basis for treatment. Nasal washings and aspirates are unacceptable for Xpert Xpress SARS-CoV-2/FLU/RSV testing.  Fact Sheet for Patients: BloggerCourse.com  Fact Sheet for Healthcare Providers: SeriousBroker.it  This test is not yet approved or cleared by the [place] FDA and has been authorized for detection and/or diagnosis of SARS-CoV-2 by FDA under an Emergency Use Authorization (EUA). This EUA will remain in effect (meaning this test can be used) for the duration of the COVID-19 declaration under Section 564(b)(1) of the Act, 21 U.S.C. section 360bbb-3(b)(1), unless the authorization is terminated or revoked.     Resp Syncytial Virus  by PCR NEGATIVE NEGATIVE Final    Comment: (NOTE) Fact Sheet for Patients: BloggerCourse.com  Fact Sheet for Healthcare Providers: SeriousBroker.it  This test is not yet approved or cleared by the [place] FDA and has been authorized for detection and/or diagnosis of SARS-CoV-2 by FDA under an Emergency Use Authorization (EUA). This EUA will remain in effect (meaning this test can be used) for the duration of the COVID-19 declaration under Section 564(b)(1) of the Act, 21 U.S.C. section 360bbb-3(b)(1), unless the authorization is  terminated or revoked.  Performed at [facility], [address] [place], [place] [address]   MRSA Next Gen by PCR, Nasal     Status: None   Collection Time: 07/11/23  5:45 PM   Specimen: Nasal Mucosa; Nasal Swab  Result Value Ref Range Status   MRSA by PCR Next Gen NOT DETECTED NOT DETECTED Final    Comment: (NOTE) The GeneXpert MRSA Assay (FDA approved for NASAL specimens only), is one component of a comprehensive MRSA colonization surveillance program. It is not intended to diagnose MRSA infection nor to guide or monitor treatment for MRSA infections. Test performance is not FDA approved in patients less than 59 years old. Performed at [facility], [address] [place], [place] [address]      Radiology Studies: No results found.    Scheduled Meds:  aspirin  81 mg Oral Daily   carvedilol  12.5 mg Oral BID WC   empagliflozin  10 mg Oral Daily   feeding supplement  1 Container Oral TID BM   finasteride  5 mg Oral Daily   furosemide  80 mg Intravenous BID   heparin  5,000 Units Subcutaneous Q8H   hydrALAZINE  50 mg Oral Q8H   insulin aspart  0-15 Units Subcutaneous TID WC   isosorbide mononitrate  15 mg Oral Daily   nicotine  14 mg Transdermal Daily   pantoprazole  40 mg Oral QHS   rosuvastatin  40 mg Oral Daily   sodium chloride flush  3 mL Intravenous Q12H   tamsulosin  0.8 mg Oral QHS   Continuous Infusions:   LOS: 5 days    Time spent:    Zannie Cove, MD [facility]   07/15/2023, 11:19 AM

## 2023-07-15 NOTE — Plan of Care (Signed)
  Problem: Nutritional: Goal: Maintenance of adequate nutrition will improve Outcome: Progressing Goal: Progress toward achieving an optimal weight will improve Outcome: Progressing   Problem: Skin Integrity: Goal: Risk for impaired skin integrity will decrease Outcome: Progressing   Problem: Activity: Goal: Risk for activity intolerance will decrease Outcome: Progressing   Problem: Coping: Goal: Level of anxiety will decrease Outcome: Progressing   Problem: Elimination: Goal: Will not experience complications related to bowel motility Outcome: Progressing Goal: Will not experience complications related to urinary retention Outcome: Progressing   Problem: Pain Managment: Goal: General experience of comfort will improve and/or be controlled Outcome: Progressing   Problem: Safety: Goal: Ability to remain free from injury will improve Outcome: Progressing   Problem: Skin Integrity: Goal: Risk for impaired skin integrity will decrease Outcome: Progressing

## 2023-07-15 NOTE — Progress Notes (Signed)
   Heart Failure Stewardship Pharmacist Progress Note   PCP: Donell Beers, FNP PCP-Cardiologist: None    HPI:  58 yo M with PMH of CHF, HTN, CVA, hepatitis C, CAD, bipolar disorder, history of cocaine use, and medical noncompliance.   Presented to the ED on 1/25 with shortness of breath, LEE, and scrotal edema for 2 months. BNP >4500, trop 91>78. UDS positive for cocaine on admission. CXR with low lung volumes, pulmonary edema, and cardiomegaly. ECHO 1/27 showed LVEF 30%, RWMA with diffuse hypokinesis, mild concentric LVH, RV low normal, moderately elevated PA pressure, moderate to severe MR.    Reports that he is still feeling short of breath at rest and on exertion. Feels that his LE edema is improving but still with significant edema especially around his knee. Had been up walking with PT today and felt great. Encouraged that his urine output has increased with higher dosing of IV lasix.   Current HF Medications: Diuretic: furosemide 80 mg IV BID Beta Blocker: carvedilol 12.5 mg BID SGLT2i: Jardiance 10 mg daily Other: hydralazine 10 mg TID  Prior to admission HF Medications: Diuretic: furosemide 40 mg daily *also prescribed amlodipine 10 mg daily, hydrochlorothiazide 25 mg daily, and clonidine 0.2 mg BID - although noncompliant  Pertinent Lab Values: Serum creatinine 1.56, BUN 43, Potassium 4.1, Sodium 136, BNP >4500, Magnesium 2.0, A1c 6.9   Vital Signs: Weight: 179 lbs (admission weight: 175 lbs) Blood pressure: 100-120/70s  Heart rate: 60-70s  I/O: net -4.5L yesterday; net -15.1L since admission  Medication Assistance / Insurance Benefits Check: Does the patient have prescription insurance?  Yes Type of insurance plan: [place] Medicaid  Outpatient Pharmacy:  [facility] Is the patient willing to use [facility] TOC pharmacy at discharge? Yes Is the patient willing to transition their outpatient pharmacy to utilize a [facility] outpatient  pharmacy?   No    Assessment: 1. Acute on chronic systolic CHF (LVEF 30%). NYHA class III symptoms. - Continue furosemide 80 mg IV BID. Unna boots placed 1/28. Output increased with higher dose. Continue to monitor renal function. Strict I/Os and daily weights. Keep K>4 and Mg>2. - Continue carvedilol 12.5 mg BID - Consider adding Entresto and spironolactone pending improvement in renal function - Continue Jardiance 10 mg daily - Hydralazine decreased to 10 mg TID. Imdur discontinued.  - Would not resume amlodipine, hydrochlorothiazide, or clonidine at discharge   Plan: 1) Medication changes recommended at this time: - Continue IV diuresis  2) Patient assistance: - Farxiga/Jardiance copay $0  3)  Education  - Initial education completed - Full education to be completed prior to discharge  Sharen Hones, PharmD, BCPS Heart Failure Engineer, building services Phone [phone]

## 2023-07-15 NOTE — Progress Notes (Signed)
Mobility Specialist Progress Note;   07/15/23 0953  Mobility  Activity Ambulated with assistance in hallway  Level of Assistance Standby assist, set-up cues, supervision of patient - no hands on  Assistive Device Front wheel walker  Distance Ambulated (ft) 200 ft  Activity Response Tolerated well  Mobility Referral Yes  Mobility visit 1 Mobility  Mobility Specialist Start Time (ACUTE ONLY) [id number]  Mobility Specialist Stop Time (ACUTE ONLY) 1000  Mobility Specialist Time Calculation (min) (ACUTE ONLY) 7 min   Pt agreeable to mobility. Required no physical assistance during ambulation, SV. VSS throughout and no c/o during session. Pt returned back to EOB with all needs met.   Caesar Bookman Mobility Specialist Please contact via SecureChat or [employer] [phone]

## 2023-07-15 NOTE — Progress Notes (Signed)
Physical Therapy Discharge Note Patient Details Name: [name] MRN: [id number] DOB: February 20, 1966 Today's Date: 07/15/2023   History of Present Illness 58 y.o. male presents to [facility] 07[facility]10[facility]23 with B LE edema and SOB. Suspect edema 2/2 systolic and diastolic CHF. Pt also with acute on chronic kidney disease. Labs positive for cocaine. PMHx: HTN, MI s/p stent, CVA, cocaine abuse, CAD    PT Comments  Pt is presenting at baseline level of functioning. Pt is Mod I without an AD for all functional activities. Pt was able to navigate stairs per home set up at Mod I. Pt O2 sats remained 98% on room air and HR 78-80 bpm. Currently pt is presenting at baseline level of functioning and no skilled physical therapy services recommended. Pt will be discharged from skilled physical therapy services at this time; please re-consult if further needs arise.        If plan is discharge home, recommend the following: A little help with walking and/or transfers;A little help with bathing/dressing/bathroom;Help with stairs or ramp for entrance;Assist for transportation     Equipment Recommendations  None recommended by PT       Precautions / Restrictions Precautions Precautions: Fall Restrictions Weight Bearing Restrictions Per Provider Order: No     Mobility  Bed Mobility Overal bed mobility: Modified Independent      Transfers Overall transfer level: Modified independent Equipment used: None Transfers: Sit to/from Stand Sit to Stand: Modified independent (Device/Increase time)        Ambulation/Gait Ambulation/Gait assistance: Modified independent (Device/Increase time) Gait Distance (Feet): 300 Feet Assistive device: None Gait Pattern/deviations: Step-through pattern   Gait velocity interpretation: 1.31 - 2.62 ft/sec, indicative of limited community ambulator   General Gait Details: Pt is slightly unstable but prefers to not ambulate with AD which most likely how he will ambulate at home.  No overt LOB.   Stairs Stairs: Yes Stairs assistance: Modified independent (Device/Increase time) Stair Management: One rail Right, Forwards, Backwards Number of Stairs: 3 General stair comments: No LOB. O2 sats 98%      Balance Overall balance assessment: Mild deficits observed, not formally tested Sitting-balance support: Feet supported, No upper extremity supported Sitting balance-Leahy Scale: Good     Standing balance support: No upper extremity supported, During functional activity Standing balance-Leahy Scale: Fair Standing balance comment: No overt LOB.        Cognition Arousal: Alert   Overall Cognitive Status: No family/caregiver present to determine baseline cognitive functioning       General Comments: Pt continues with difficulty understanding O2 needs vs fluid           General Comments General comments (skin integrity, edema, etc.): Pt O2 sats 98% during activity on room air. HR 78-80 bpm. Respirations 26 breathes per min      Pertinent Vitals/Pain Pain Assessment Pain Assessment: No/denies pain     PT Goals (current goals can now be found in the care plan section) Acute Rehab PT Goals Patient Stated Goal: to go home PT Goal Formulation: With patient Time For Goal Achievement: 07/25/23 Potential to Achieve Goals: Good Progress towards PT goals: Goals met/education completed, patient discharged from PT    Frequency    Min 1X/week      PT Plan  Pt will be discharged at this time.        AM-PAC PT "6 Clicks" Mobility   Outcome Measure  Help needed turning from your back to your side while in a flat bed without using bedrails?:  None Help needed moving from lying on your back to sitting on the side of a flat bed without using bedrails?: None Help needed moving to and from a bed to a chair (including a wheelchair)?: None Help needed standing up from a chair using your arms (e.g., wheelchair or bedside chair)?: None Help needed to walk in  hospital room?: None Help needed climbing 3-5 steps with a railing? : None 6 Click Score: 24    End of Session   Activity Tolerance: Patient tolerated treatment well Patient left: in bed;with call bell/phone within reach Nurse Communication: Mobility status PT Visit Diagnosis: Unsteadiness on feet (R26.81)     Time: 7846-9629 PT Time Calculation (min) (ACUTE ONLY): 13 min  Charges:    $Therapeutic Activity: 8-22 mins PT General Charges $$ ACUTE PT VISIT: 1 Visit                     Harrel Carina, DPT, CLT  Acute Rehabilitation Services Office: [phone] (Secure chat preferred)    Claudia Desanctis 07/15/2023, 1:47 PM

## 2023-07-16 DIAGNOSIS — I5023 Acute on chronic systolic (congestive) heart failure: Secondary | ICD-10-CM | POA: Diagnosis not present

## 2023-07-16 LAB — GLUCOSE, CAPILLARY
Glucose-Capillary: 161 mg/dL — ABNORMAL HIGH (ref 70–99)
Glucose-Capillary: 172 mg/dL — ABNORMAL HIGH (ref 70–99)
Glucose-Capillary: 210 mg/dL — ABNORMAL HIGH (ref 70–99)
Glucose-Capillary: 160 mg/dL — ABNORMAL HIGH (ref 70–99)

## 2023-07-16 LAB — BASIC METABOLIC PANEL
Anion gap: 11 (ref 5–15)
BUN: 49 mg/dL — ABNORMAL HIGH (ref 6–20)
CO2: 26 mmol/L (ref 22–32)
Calcium: 8.5 mg/dL — ABNORMAL LOW (ref 8.9–10.3)
Chloride: 98 mmol/L (ref 98–111)
Creatinine, Ser: 1.6 mg/dL — ABNORMAL HIGH (ref 0.61–1.24)
GFR, Estimated: 50 mL/min — ABNORMAL LOW (ref >60)
Glucose, Bld: 206 mg/dL — ABNORMAL HIGH (ref 70–99)
Potassium: 4.3 mmol/L (ref 3.5–5.1)
Sodium: 135 mmol/L (ref 135–145)

## 2023-07-16 NOTE — Progress Notes (Signed)
Mobility Specialist Progress Note;   07/16/23 0935  Mobility  Activity Ambulated with assistance in hallway  Level of Assistance Standby assist, set-up cues, supervision of patient - no hands on  Assistive Device Front wheel walker  Distance Ambulated (ft) 400 ft  Activity Response Tolerated well  Mobility Referral Yes  Mobility visit 1 Mobility  Mobility Specialist Start Time (ACUTE ONLY) 0935  Mobility Specialist Stop Time (ACUTE ONLY) 0944  Mobility Specialist Time Calculation (min) (ACUTE ONLY) 9 min   Pt agreeable to mobility. Pt requesting to use RW this session. Required no physical assistance during ambulation, SV. VSS throughout and no c/o during session. Pt returned back to EOB w/ all needs met.   Caesar Bookman Mobility Specialist Please contact via SecureChat or [employer] [phone]

## 2023-07-16 NOTE — Discharge Instructions (Signed)
Outpatient Substance Use Treatment Services     [facility]    Chemical Dependence Intensive Outpatient Program   [address]   [place], [place] [address]    [phone]   Private insurance, Medicare A&B, and [facility]       [facility] (Alcohol and Drug Services)    [address],    [place], [place] [address]   [phone]   Medicaid, Self Pay       [facility]        [address]    [place], [place]   [phone]   Medicaid and [facility], Self Pay       [facility]   [address]    [place], [place]    [phone]   [facility], and Self Pay      [facility] [place]        [address]      [place], [place] [address]    [phone] or [phone]   Private Insurance Only      [facility]   [address]    [place], [place] [address]   [phone]   Medicaid, Medicare, Private Insurance      [facility] at [facility]   [address]    [place], [place] [address]   [phone]   Services are free or reduced      [facility]    [address]   [phone]    Self Pay only, sliding scale      [facility]    [address]    [place], [place] [address]   [phone]   ([facility])   Self Pay, Medicaid Only       [facility]   [address], [place] [address]   [phone]   Medicaid, Medicare, Private Insurance      Residential Substance Use Treatment Services       [facility]    [address]    [place], [place] [address]    [phone] or [phone]   Detox (Medicare, Medicaid, private insurance, and self pay)    Residential Rehab 14 days (Medicare, [place], private insurance, and self pay)       [facility] ([facility])     [address] [place], [place]    [phone]    Male and Male Detox (Self Pay and Medicaid limited availability)    Rehab only Male ([place] and self pay only)       [facility] [address]        [address]    [place], [place] [address]    [phone] or [phone]   Detox and Residential Treatment   Private Insurance Only       [facility]    [address]    [place], [place] [address]    [phone]    Treatment Only, must make assessment appointment, and must be sober for assessment appointment.    Self Pay Only, Medicare A&B, [facility] [place] ID only!   *Transportation assistance offered from [place] on [employer]       [address]   [place], [place] [address]   Walk in interviews M-Sat 8-4p   No pending legal charges   [phone]  [facility]:  [facility]   Referral    [address]   [place], [place]   [phone]   (Self Pay, [facility])      [facility]   [address]   [place], [place] [address]   [phone]   Detox and Residential Treatment   Medicare and Private Insurance      [facility]   [address]    [place], [place] [address]   28 Day Women's Facility: [phone]   28 Day Men's Facility: [phone]   Long-term Residential Program:    [phone] Males 25 and Over   (No Insurance, upfront fee)      [facility]    [address]   [place], [place] [address]   [phone]   Private Insurance with [place], Private Pay     [facility]   [address]  [place], [place] [address]  Local?[phone]   Private Insurance Only      [facility]   [address]    [place], [place] [address]    [phone]   (Males, upfront fee)    [employer] Guide Inpatient Behavioral Health/Residential Substance Abuse Treatment - Adults The [employer]'s "211" is a great source of information about  community services available.  Access by dialing 2-1-1 from anywhere in [place], or by website -  PooledIncome.pl.    (Updated 09/2015)   Crisis Assistance 24 hours a day     Services Offered       [facility] [employer] 24-hour crisis assistance: [phone] [place], [place]  [facility] 24-hour crisis assistance:[phone] [place], [place]  [facility] 24-hour crisis assistance: [phone] [place], [place]  [facility] Access to Care Line 24-hour crisis assistance; [phone] All  [facility] 24-hour crisis response line: [phone] All    Other Local Resources (Updated 06/2015)   Inpatient Behavioral Health/Residential Substance Abuse Treatment Programs     Services          Address and Phone Number  [facility] ([facility])   14-day residential rehabilitation  [phone] [address] [place], [place]  [facility] ([facility])    Detox - private pay only 14-day residential rehabilitation -  Medicaid, insurance, private pay only [phone], or [phone] [address], [place], [place] [address]   [facility] only Multiple facilities [phone] admissions    [facility] ([facility])   90-day program Must be homeless to participate   [phone], or [phone] Marcy Panning, [facility]  [facility] only [phone], or  [phone] [address] [place], [place] [address]  [facility] Residential Treatment Services Must make an appointment Accepts private pay, [facility] [phone]  [address] [place], [place] [address]   [facility] Females only Associated with the [facility] [phone] [address] [place], [place] [address]  [facility] only [phone], or [phone] [address] [place], [phone]  [facility]     Detox Residential rehabilitation Private insurance only Multiple locations [phone] admissions  [facility]     Private pay Private insurance [phone] [address] [place], [place] [address]  [facility]     Males only Fee required at time of admission [phone] [address] [place], [place] [address]  [facility]     Private pay only   [phone] [address] [place], [place]  [facility] (Residential Treatment Services)    Detox - private pay, Medicaid Residential rehabilitation for males  - Medicare, Medicaid, insurance, private pay [phone] [address] [place], [place]   O[facility]JG              Walk-in interviews Monday - Saturday from 8 am - 4 pm Individuals with legal charges are not eligible [phone] [address] [place], [place] [address]  [facility]  Must be willing to work Must attend Alcoholics Anonymous meetings [phone] [address] [place], [place]   [facility] [employer]    Faith-based program Private pay only [phone] [address] [place], [place]         [employer] Resource/Facility Type: Syringe Services Program, Harm Reduction, Peer-Support Mobile Exchange [phone] (call to setup exchange) https://www.PrimeForces.ca  [facility] Resource/Facility Type:  IOP, OP [address] [place], [place] [address] [phone] Insurance/Self-Pay, Medicare, Medicaid, Sliding scale https://www.[facility]recovery.org/locations/[place]-[facility]  [facility] Resource/Facility Type: IOP, OP [address] [place], [place] [address] [phone] Insurance/Self-Pay, Medicare, Medicaid, Sliding scale https://www.daymarkrecovery.org/  [facility] Recovery Services- [facility] Resource/Facility Type: Detox [address] [place], [place]  [address] [phone] Insurance/Self-Pay, Medicare, Medicaid, Sliding scale, Non-Insurance https://www.[facility]recovery.org/locations/[facility]bc-[place]  [employer] (Women) [address], [place] [address] [phone] Check for available beds/vacancies here: https://www.oxfordvacancies.com/  [employer] (Men) [address] [place], [place] [address] [phone] Check for available beds/vacancies here: https://www.oxfordvacancies.com/  [facility] Resource/Facility Type: Health Department, Harm Reduction [address] [place], [place] [address] [phone] [email or web address]  [facility] Resource/Facility Type: Buprenorphine Provider, Medically Assisted Treatment [address] [place], [place] [address] [phone] [employer]Tell[employer]is

## 2023-07-16 NOTE — Progress Notes (Signed)
Orthopedic Tech Progress Note Patient Details:  [name] 08-Mar-1966 [id number]  Ortho Devices Type of Ortho Device: Radio broadcast assistant Ortho Device/Splint Location: BLE Ortho Device/Splint Interventions: Application, Ordered   Post Interventions Patient Tolerated: Well Instructions Provided: Care of device  [name] A [name] 07/16/2023, 11:33 AM

## 2023-07-16 NOTE — Progress Notes (Signed)
PROGRESS NOTE    [name]  ZOX:[id number] DOB: 1966/06/11 DOA: 07/10/2023 PCP: Donell Beers, FNP  58/M w/ history of cocaine abuse, hypertension, CAD, CVA, T2DM and bipolar disorder who presented with lower extremity edema. Noncompliant w medical therapy or outpatient follow up. IN ED, Hypertensive, positive lower extremity edema. bun 52 cr 1,85 , BNP >4,500, troponin 91 and 78  Toxicology screen positive for cocaine   CXR wcardiomegaly with bilateral hilar vascular congestion, bilateral central interstitial infiltrates with cephalization in the vasculature and fluid in the right fissure.  -1/27 responding to diuresis but remains volume overloaded  Subjective: Feels better overall, improving with diuresis  Assessment and Plan:  Acute on chronic systolic CHF  -Echo with reduced LV systolic function 30%, LV with moderate to severe reduction in systolic function, diffuse hypokinesis, akinesis of the basal to mid infero lateral and antero lateral wall. RV systolic function low normal, Moderate to severe MR,  -17.7 L negative, still remains volume overloaded, continue Lasix 80 Mg twice daily, Jardiance  -Mild uptrend in creatinine, add low-dose Aldactone in 1 to 2 days if kidney function tolerates -Continue Unna boots -Blood pressure trending down, discontinue hydralazine today -Cards following, no plan for ischemic workup at this time  Hypoalbuminemia -Unclear if he has liver disease, likely combination of malnutrition and chronic liver disease  CAD (coronary artery disease) Continue aspirin and statin  High sensitive troponin elevation due to heart failure, ruled out acute coronary syndrome.   Hypertension Meds as above  AKI (acute kidney injury) (HCC) -Likely cardiorenal, improving with diuresis  DM II (diabetes mellitus, type II), controlled (HCC) Improving, continue SSI, Jardiance  Anxiety Continue buspirone.   Cigarette smoker Nicotine patch counseling once  patient is stable.  Cocaine abuse (HCC) Counseled  GERD (gastroesophageal reflux disease) Continue pantoprazole.   Protein-calorie malnutrition, severe Continue with nutritional supplements.   Elevated LFTs Transaminitis secondary to passive hepatic congestion from CHF exacerbation.  CVA (cerebral vascular accident) (HCC) - aspirin and statin   DVT prophylaxis: Hep SQ Code Status: Full Code Family Communication: None present Disposition Plan: Home likely 48 hours  Consultants:    Procedures:   Antimicrobials:    Objective: Vitals:   07/15/23 2000 07/15/23 2317 07/16/23 0455 07/16/23 0800  BP:  (!) 125/93 126/87 (!) 124/92  Pulse:  78 72   Resp: 20 19 16 18   Temp:  98.2 F (36.8 C) 97.6 F (36.4 C) 98.2 F (36.8 C)  TempSrc:  Oral Oral Oral  SpO2: 97% 97% 97%   Weight:   79.9 kg   Height:        Intake/Output Summary (Last 24 hours) at 07/16/2023 0951 Last data filed at 07/16/2023 0900 Gross per 24 hour  Intake 958 ml  Output 4800 ml  Net -3842 ml   Filed Weights   07/14/23 0500 07/15/23 0532 07/16/23 0455  Weight: 83 kg 81.6 kg 79.9 kg    Examination:  General exam: Chronically ill male sitting up in bed, AAOx3 HEENT: Positive JVD CVS: S1-S2, regular rhythm Lungs: Decreased breath sounds to bases Abdomen: Soft, nontender, bowel sounds present Extremities: 2+ edema, Unna boots off this morning  Skin: No rashes Psychiatry:  Mood & affect appropriate.     Data Reviewed:   CBC: Recent Labs  Lab 07/10/23 1549 07/10/23 2036 07/11/23 0449 07/15/23 0218  WBC 9.5 9.9 9.9 9.8  HGB 14.8 16.1 15.2 13.7  HCT 44.9 47.9 45.8 42.3  MCV 93.7 92.8 93.3 93.2  PLT 204  232 193 190   Basic Metabolic Panel: Recent Labs  Lab 07/12/23 0209 07/13/23 0211 07/14/23 0214 07/15/23 0218 07/16/23 0753  NA 135 138 135 136 135  K 3.7 3.5 3.6 4.1 4.3  CL 104 104 104 104 98  CO2 22 25 23  20* 26  GLUCOSE 161* 134* 157* 111* 206*  BUN 38* 38* 39* 43* 49*   CREATININE 1.82* 1.39* 1.43* 1.56* 1.60*  CALCIUM 8.2* 8.4* 7.9* 8.1* 8.5*  MG 2.1 2.0 2.0  --   --    GFR: Estimated Creatinine Clearance: 57.6 mL/min (A) (by C-G formula based on SCr of 1.6 mg/dL (H)). Liver Function Tests: Recent Labs  Lab 07/10/23 1549 07/11/23 0449 07/15/23 0218  AST 50* 39 31  ALT 54* 48* 28  ALKPHOS 116 115 84  BILITOT 1.5* 1.3* 1.1  PROT 5.2* 5.5* 5.5*  ALBUMIN 2.7* 2.7* 2.5*   No results for input(s): "LIPASE", "AMYLASE" in the last 168 hours. No results for input(s): "AMMONIA" in the last 168 hours. Coagulation Profile: Recent Labs  Lab 07/15/23 0218  INR 1.0   Cardiac Enzymes: No results for input(s): "CKTOTAL", "CKMB", "CKMBINDEX", "TROPONINI" in the last 168 hours. BNP (last 3 results) Recent Labs    05/28/23 0850  PROBNP 24,067*   HbA1C: No results for input(s): "HGBA1C" in the last 72 hours. CBG: Recent Labs  Lab 07/15/23 0610 07/15/23 1115 07/15/23 1639 07/15/23 2126 07/16/23 0615  GLUCAP 143* 102* 102* 205* 161*   Lipid Profile: No results for input(s): "CHOL", "HDL", "LDLCALC", "TRIG", "CHOLHDL", "LDLDIRECT" in the last 72 hours.  Thyroid Function Tests: No results for input(s): "TSH", "T4TOTAL", "FREET4", "T3FREE", "THYROIDAB" in the last 72 hours.  Anemia Panel: No results for input(s): "VITAMINB12", "FOLATE", "FERRITIN", "TIBC", "IRON", "RETICCTPCT" in the last 72 hours. Urine analysis:    Component Value Date/Time   COLORURINE YELLOW 07/10/2023 1555   APPEARANCEUR CLEAR 07/10/2023 1555   LABSPEC 1.014 07/10/2023 1555   PHURINE 5.0 07/10/2023 1555   GLUCOSEU NEGATIVE 07/10/2023 1555   HGBUR SMALL (A) 07/10/2023 1555   BILIRUBINUR NEGATIVE 07/10/2023 1555   KETONESUR NEGATIVE 07/10/2023 1555   PROTEINUR 100 (A) 07/10/2023 1555   UROBILINOGEN 1.0 03/02/2010 2212   NITRITE NEGATIVE 07/10/2023 1555   LEUKOCYTESUR NEGATIVE 07/10/2023 1555   Sepsis Labs: @LABRCNTIP (procalcitonin:4,lacticidven:4)  ) Recent  Results (from the past 240 hours)  Resp panel by RT-PCR (RSV, Flu A&B, Covid) Anterior Nasal Swab     Status: None   Collection Time: 07/10/23  3:49 PM   Specimen: Anterior Nasal Swab  Result Value Ref Range Status   SARS Coronavirus 2 by RT PCR NEGATIVE NEGATIVE Final   Influenza A by PCR NEGATIVE NEGATIVE Final   Influenza B by PCR NEGATIVE NEGATIVE Final    Comment: (NOTE) The Xpert Xpress SARS-CoV-2/FLU/RSV plus assay is intended as an aid in the diagnosis of influenza from Nasopharyngeal swab specimens and should not be used as a sole basis for treatment. Nasal washings and aspirates are unacceptable for Xpert Xpress SARS-CoV-2/FLU/RSV testing.  Fact Sheet for Patients: BloggerCourse.com  Fact Sheet for Healthcare Providers: SeriousBroker.it  This test is not yet approved or cleared by the [place] FDA and has been authorized for detection and/or diagnosis of SARS-CoV-2 by FDA under an Emergency Use Authorization (EUA). This EUA will remain in effect (meaning this test can be used) for the duration of the COVID-19 declaration under Section 564(b)(1) of the Act, 21 U.S.C. section 360bbb-3(b)(1), unless the authorization is terminated or revoked.  Resp Syncytial Virus by PCR NEGATIVE NEGATIVE Final    Comment: (NOTE) Fact Sheet for Patients: BloggerCourse.com  Fact Sheet for Healthcare Providers: SeriousBroker.it  This test is not yet approved or cleared by the [place] FDA and has been authorized for detection and/or diagnosis of SARS-CoV-2 by FDA under an Emergency Use Authorization (EUA). This EUA will remain in effect (meaning this test can be used) for the duration of the COVID-19 declaration under Section 564(b)(1) of the Act, 21 U.S.C. section 360bbb-3(b)(1), unless the authorization is terminated or revoked.  Performed at [facility], [address] [place], [place] [address]   MRSA Next Gen by PCR, Nasal     Status: None   Collection Time: 07/11/23  5:45 PM   Specimen: Nasal Mucosa; Nasal Swab  Result Value Ref Range Status   MRSA by PCR Next Gen NOT DETECTED NOT DETECTED Final    Comment: (NOTE) The GeneXpert MRSA Assay (FDA approved for NASAL specimens only), is one component of a comprehensive MRSA colonization surveillance program. It is not intended to diagnose MRSA infection nor to guide or monitor treatment for MRSA infections. Test performance is not FDA approved in patients less than 7 years old. Performed at [facility], [address] [place], [place] [address]      Radiology Studies: No results found.    Scheduled Meds:  aspirin  81 mg Oral Daily   carvedilol  12.5 mg Oral BID WC   empagliflozin  10 mg Oral Daily   feeding supplement  1 Container Oral TID BM   finasteride  5 mg Oral Daily   furosemide  80 mg Intravenous BID   heparin  5,000 Units Subcutaneous Q8H   insulin aspart  0-15 Units Subcutaneous TID WC   nicotine  14 mg Transdermal Daily   pantoprazole  40 mg Oral QHS   rosuvastatin  40 mg Oral Daily   sodium chloride flush  3 mL Intravenous Q12H   tamsulosin  0.8 mg Oral QHS   Continuous Infusions:   LOS: 6 days    Time spent:    Zannie Cove, MD [facility]   07/16/2023, 9:51 AM

## 2023-07-16 NOTE — Progress Notes (Signed)
Occupational Therapy Treatment and Discharge Patient Details Name: [name] MRN: [id number] DOB: Aug 18, 1965 Today's Date: 07/16/2023   History of present illness 58 y.o. male presents to [facility] 07/10/23 with B LE edema and SOB. Suspect edema 2/2 systolic and diastolic CHF. Pt also with acute on chronic kidney disease. Labs positive for cocaine. PMHx: HTN, MI s/p stent, CVA, cocaine abuse, CAD   OT comments  OT session focused on training in techniques for increased safety and independence with ADLs and functional transfers/mobility. Pt currently demonstrates ability to complete ADLs Mod I to Supervision for safety and functional mobility/transfers without an AD with mod I to Supervision for safety due to impaired safety awareness (OT uncertain of baseline), occasional impulsiveness, and occasional SOB with activity in standing. Pt participated well in session and reports he feels he is at baseline except for occasional SOB. Pt has met all OT goals at this time and is appropriate for discharge from an OT standpoint. No post-acute skilled OT or equipment needs identified at this time. OT is signing off.       If plan is discharge home, recommend the following:  A little help with walking and/or transfers;A little help with bathing/dressing/bathroom;Assistance with cooking/housework;Assist for transportation;Help with stairs or ramp for entrance   Equipment Recommendations  None recommended by OT    Recommendations for Other Services      Precautions / Restrictions Precautions Precautions: Fall Restrictions Weight Bearing Restrictions Per Provider Order: No       Mobility Bed Mobility               General bed mobility comments: Pt sitting on EOB at beginning and end of session    Transfers Overall transfer level: Needs assistance Equipment used: None Transfers: Sit to/from Stand, Bed to chair/wheelchair/BSC Sit to Stand: Modified independent (Device/Increase time)      Step pivot transfers: Supervision     General transfer comment: supervision for safety; no physical assist needed     Balance Overall balance assessment: Mild deficits observed, not formally tested Sitting-balance support: No upper extremity supported, Feet supported Sitting balance-Leahy Scale: Good     Standing balance support: No upper extremity supported, During functional activity Standing balance-Leahy Scale: Fair (Fair+ to Good-) Standing balance comment: No overt LOB.                           ADL either performed or assessed with clinical judgement   ADL Overall ADL's : Modified independent;At baseline;Needs assistance/impaired Eating/Feeding: Modified independent;Sitting   Grooming: Modified independent;Standing   Upper Body Bathing: Modified independent;Sitting   Lower Body Bathing: Supervison/ safety;Sit to/from stand Lower Body Bathing Details (indicate cue type and reason): Supervision for safety Upper Body Dressing : Modified independent;Sitting   Lower Body Dressing: Modified independent;Sit to/from stand   Toilet Transfer: Modified Independent;Ambulation;Regular Toilet;Grab bars (without an AD)   Toileting- Clothing Manipulation and Hygiene: Modified independent;Sit to/from stand       Functional mobility during ADLs: Supervision/safety (without an AD) General ADL Comments: Pt currently Mod I to Supervision for safety with ADLs and funcitonal mobility short distances/transfers without an AD. OT recommends Supervision for funcitonal mobility outside of room due to conitnued SOB with activity, occasional impulsiveness, and impaired safety awareness. Pt now at or within 95% of baseline PLOF with pt reporting feeling "back to normal" except for occasional episodes of shortness of breath with activities in standing.    Extremity/Trunk Assessment Upper Extremity Assessment  Upper Extremity Assessment: Generalized weakness   Lower Extremity  Assessment Lower Extremity Assessment: Defer to PT evaluation        Vision       Perception     Praxis      Cognition Arousal: Alert Behavior During Therapy: WFL for tasks assessed/performed, Impulsive (Largely WFl with occasional impulsiveness with movement) Overall Cognitive Status: No family/caregiver present to determine baseline cognitive functioning                                 General Comments: Cognition largely [facility] for tasks assists. However, pt presents with noted short-term memory deficits, impaired insight into deficits, impaired safety awareness, and with poor carryover of prior education regarding O2 needs versus fluid. OT uncertain of pt's baseline cognition.        Exercises      Shoulder Instructions       General Comments VSS on RA throughout session    Pertinent Vitals/ Pain       Pain Assessment Pain Assessment: No/denies pain  Home Living                                          Prior Functioning/Environment              Frequency           Progress Toward Goals  OT Goals(current goals can now be found in the care plan section)  Progress towards OT goals: Goals met/education completed, patient discharged from OT  Acute Rehab OT Goals Patient Stated Goal: to return home  Plan      Co-evaluation                 AM-PAC OT "6 Clicks" Daily Activity     Outcome Measure   Help from another person eating meals?: None Help from another person taking care of personal grooming?: None Help from another person toileting, which includes using toliet, bedpan, or urinal?: None Help from another person bathing (including washing, rinsing, drying)?: A Little (Supervision for safety) Help from another person to put on and taking off regular upper body clothing?: None Help from another person to put on and taking off regular lower body clothing?: None 6 Click Score: 23    End of Session    OT  Visit Diagnosis: Unsteadiness on feet (R26.81);Muscle weakness (generalized) (M62.81)   Activity Tolerance Patient tolerated treatment well   Patient Left in bed;with call bell/phone within reach   Nurse Communication Mobility status;Other (comment) (Pt requesting extra sandwich with dinner if possible)        Time: [id number] OT Time Calculation (min): 19 min  Charges: OT General Charges $OT Visit: 1 Visit OT Treatments $Self Care/Home Management : 8-22 mins  Edessa Jakubowicz "Orson Eva., OTR/L, MA Acute Rehab [phone]   Lendon Colonel 07/16/2023, 4:50 PM

## 2023-07-16 NOTE — Plan of Care (Signed)
No changes today can continue diuresis.  Can assess over the weekend to determine when he can transition to oral.  If his creatinine continues to rise would switch him to oral Lasix 40 mg twice daily with follow-up bmet in 2 weeks.  With low albumin and malnutrition this will be challenging.

## 2023-07-16 NOTE — TOC Progression Note (Signed)
Transition of Care [facility]) - Progression Note    Patient Details  Name: [name] MRN: [id number] Date of Birth: 1965/08/23  Transition of Care [facility]) CM/SW Contact  Marliss Coots, LCSW Phone Number: 07/16/2023, 4:52 PM  Clinical Narrative:     4:52 PM Per patient consent, CSW added substance use resources to patient's AVS.  Expected Discharge Plan: Home/Self Care Barriers to Discharge: Continued Medical Work up  Expected Discharge Plan and Services In-house Referral: PCP / [facility] Discharge Planning Services: Follow-up appt scheduled   Living arrangements for the past 2 months: Single Family Home                                       Social Determinants of Health (SDOH) Interventions SDOH Screenings   Food Insecurity: No Food Insecurity (07/11/2023)  Housing: Unknown (07/12/2023)  Recent Concern: Housing - High Risk (07/11/2023)  Transportation Needs: No Transportation Needs (07/12/2023)  Utilities: Not At Risk (07/11/2023)  Alcohol Screen: Low Risk  (07/12/2023)  Financial Resource Strain: Low Risk  (07/12/2023)  Tobacco Use: High Risk (07/10/2023)    Readmission Risk Interventions     No data to display

## 2023-07-16 NOTE — Progress Notes (Signed)
   Heart Failure Stewardship Pharmacist Progress Note   PCP: Donell Beers, FNP PCP-Cardiologist: None    HPI:  58 yo M with PMH of CHF, HTN, CVA, hepatitis C, CAD, bipolar disorder, history of cocaine use, and medical noncompliance.   Presented to the ED on 1/25 with shortness of breath, LEE, and scrotal edema for 2 months. BNP >4500, trop 91>78. UDS positive for cocaine on admission. CXR with low lung volumes, pulmonary edema, and cardiomegaly. ECHO 1/27 showed LVEF 30%, RWMA with diffuse hypokinesis, mild concentric LVH, RV low normal, moderately elevated PA pressure, moderate to severe MR.    Reports that he is still feeling short of breath at rest and on exertion but seems less short of breath when speaking to me today vs the last two days. Still has significantly LE edema. Wraps were taken off yesterday but not replaced. He states someone is coming today to re-wrap his legs. Had been up walking with PT again today and felt great.   Current HF Medications: Diuretic: furosemide 80 mg IV BID Beta Blocker: carvedilol 12.5 mg BID SGLT2i: Jardiance 10 mg daily  Prior to admission HF Medications: Diuretic: furosemide 40 mg daily *also prescribed amlodipine 10 mg daily, hydrochlorothiazide 25 mg daily, and clonidine 0.2 mg BID - although noncompliant  Pertinent Lab Values: Serum creatinine 1.60, BUN 49, Potassium 4.3, Sodium 135, BNP >4500, Magnesium 2.0, A1c 6.9   Vital Signs: Weight: 176 lbs (admission weight: 175 lbs) Blood pressure: 120/90s  Heart rate: 70-80s  I/O: net -3.6L yesterday; net -17.8L since admission  Medication Assistance / Insurance Benefits Check: Does the patient have prescription insurance?  Yes Type of insurance plan: [place] Medicaid  Outpatient Pharmacy:  Prior to admission outpatient [facility] Is the patient willing to use [facility] TOC pharmacy at discharge? Yes Is the patient willing to transition their outpatient pharmacy to utilize a [facility]  outpatient pharmacy?   No    Assessment: 1. Acute on chronic systolic CHF (LVEF 30%). NYHA class III symptoms. - Continue furosemide 80 mg IV BID. Unna boots placed 1/28, will be re-wrapped today. Output increased with higher dose. Continue to monitor renal function, stable today. Strict I/Os and daily weights. Keep K>4 and Mg>2. - Continue carvedilol 12.5 mg BID - Consider adding Entresto and spironolactone pending improvement in renal function - Continue Jardiance 10 mg daily - Now off hydralazine/nitrate with BP more controlled - Would not resume amlodipine, hydrochlorothiazide, or clonidine at discharge   Plan: 1) Medication changes recommended at this time: - Continue IV diuresis  2) Patient assistance: - Farxiga/Jardiance copay $0  3)  Education  - Patient has been educated on current HF medications and potential additions to HF medication regimen - Patient verbalizes understanding that over the next few months, these medication doses may change and more medications may be added to optimize HF regimen - Patient has been educated on basic disease state pathophysiology and goals of therapy   Sharen Hones, PharmD, BCPS Heart Failure Stewardship Pharmacist Phone [phone]

## 2023-07-16 NOTE — Plan of Care (Signed)
  Problem: Nutritional: Goal: Progress toward achieving an optimal weight will improve Outcome: Progressing   Problem: Education: Goal: Knowledge of General Education information will improve Description: Including pain rating scale, medication(s)/side effects and non-pharmacologic comfort measures Outcome: Progressing   Problem: Clinical Measurements: Goal: Ability to maintain clinical measurements within normal limits will improve Outcome: Progressing   Problem: Education: Goal: Ability to demonstrate management of disease process will improve Outcome: Progressing

## 2023-07-17 DIAGNOSIS — I5023 Acute on chronic systolic (congestive) heart failure: Secondary | ICD-10-CM | POA: Diagnosis not present

## 2023-07-17 LAB — BASIC METABOLIC PANEL
Anion gap: 13 (ref 5–15)
BUN: 57 mg/dL — ABNORMAL HIGH (ref 6–20)
CO2: 24 mmol/L (ref 22–32)
Calcium: 8.9 mg/dL (ref 8.9–10.3)
Chloride: 98 mmol/L (ref 98–111)
Creatinine, Ser: 1.59 mg/dL — ABNORMAL HIGH (ref 0.61–1.24)
GFR, Estimated: 50 mL/min — ABNORMAL LOW (ref >60)
Glucose, Bld: 107 mg/dL — ABNORMAL HIGH (ref 70–99)
Potassium: 4 mmol/L (ref 3.5–5.1)
Sodium: 135 mmol/L (ref 135–145)

## 2023-07-17 LAB — GLUCOSE, CAPILLARY
Glucose-Capillary: 131 mg/dL — ABNORMAL HIGH (ref 70–99)
Glucose-Capillary: 113 mg/dL — ABNORMAL HIGH (ref 70–99)
Glucose-Capillary: 159 mg/dL — ABNORMAL HIGH (ref 70–99)
Glucose-Capillary: 177 mg/dL — ABNORMAL HIGH (ref 70–99)

## 2023-07-17 MED ORDER — SPIRONOLACTONE 12.5 MG HALF TABLET
12.5000 mg | ORAL_TABLET | Freq: Every day | ORAL | Status: DC
  Administered 2023-07-17 – 2023-07-22 (×6): 12.5 mg via ORAL
  Filled 2023-07-17 (×6): qty 1

## 2023-07-17 NOTE — Progress Notes (Signed)
Rounding Note   Patient Name: [name] Date of Encounter: 07/17/2023  [facility] Cardiologist: Dr Tomie China  Subjective   Net negative another 3.2L overnight on IV lasix - overall 21L negative. Creatinine has remained stable despite aggressive diuresis.   Inpatient Medications    Scheduled Meds:  aspirin  81 mg Oral Daily   carvedilol  12.5 mg Oral BID WC   empagliflozin  10 mg Oral Daily   feeding supplement  1 Container Oral TID BM   finasteride  5 mg Oral Daily   furosemide  80 mg Intravenous BID   heparin  5,000 Units Subcutaneous Q8H   insulin aspart  0-15 Units Subcutaneous TID WC   nicotine  14 mg Transdermal Daily   pantoprazole  40 mg Oral QHS   rosuvastatin  40 mg Oral Daily   sodium chloride flush  3 mL Intravenous Q12H   spironolactone  12.5 mg Oral Daily   tamsulosin  0.8 mg Oral QHS   Continuous Infusions:  PRN Meds: acetaminophen **OR** acetaminophen, albuterol, hydrALAZINE, nitroGLYCERIN, ondansetron **OR** ondansetron (ZOFRAN) IV   Vital Signs    Vitals:   07/16/23 1928 07/16/23 2236 07/17/23 0525 07/17/23 0740  BP: 122/82 137/87 (!) 128/96 125/88  Pulse: 77 76 80 70  Resp: 20 (!) 24 16 18   Temp: 97.6 F (36.4 C) 98.4 F (36.9 C) 97.9 F (36.6 C) 98.3 F (36.8 C)  TempSrc: Axillary Oral Oral Oral  SpO2: 97% 96% 98% 98%  Weight:   79.9 kg   Height:        Intake/Output Summary (Last 24 hours) at 07/17/2023 0952 Last data filed at 07/17/2023 0750 Gross per 24 hour  Intake 1080 ml  Output 4890 ml  Net -3810 ml      07/17/2023    5:25 AM 07/16/2023    4:55 AM 07/15/2023    5:32 AM  Last 3 Weights  Weight (lbs) 176 lb 2.4 oz 176 lb 2.4 oz 179 lb 14.3 oz  Weight (kg) 79.9 kg 79.9 kg 81.6 kg      Telemetry    Sinus rhythm - Personally Reviewed  Physical Exam   General appearance: alert and no distress Lungs: clear to auscultation bilaterally Heart: regular rate and rhythm Extremities: edema 2+ bilateral LE, wrapped  legs Neurologic: Grossly normal   Labs    High Sensitivity Troponin:   Recent Labs  Lab 07/10/23 1549 07/10/23 1752  TROPONINIHS 91* 78*     Chemistry Recent Labs  Lab 07/10/23 1549 07/10/23 2036 07/11/23 0449 07/12/23 0209 07/13/23 0211 07/14/23 0214 07/15/23 0218 07/16/23 0753 07/17/23 0203  NA 137  --  138 135 138 135 136 135 135  K 5.0  --  3.6 3.7 3.5 3.6 4.1 4.3 4.0  CL 106  --  108 104 104 104 104 98 98  CO2 21*  --  21* 22 25 23  20* 26 24  GLUCOSE 119*  --  160* 161* 134* 157* 111* 206* 107*  BUN 52*  --  43* 38* 38* 39* 43* 49* 57*  CREATININE 1.84*   < > 1.85* 1.82* 1.39* 1.43* 1.56* 1.60* 1.59*  CALCIUM 8.7*  --  8.4* 8.2* 8.4* 7.9* 8.1* 8.5* 8.9  MG  --   --   --  2.1 2.0 2.0  --   --   --   PROT 5.2*  --  5.5*  --   --   --  5.5*  --   --  ALBUMIN 2.7*  --  2.7*  --   --   --  2.5*  --   --   AST 50*  --  39  --   --   --  31  --   --   ALT 54*  --  48*  --   --   --  28  --   --   ALKPHOS 116  --  115  --   --   --  84  --   --   BILITOT 1.5*  --  1.3*  --   --   --  1.1  --   --   GFRNONAA 42*   < > 42* 43* 59* 57* 51* 50* 50*  ANIONGAP 10  --  9 9 9 8 12 11 13    < > = values in this interval not displayed.    Lipids  Recent Labs  Lab 07/10/23 2036  CHOL 218*  TRIG 108  HDL 44  LDLCALC 152*  CHOLHDL 5.0    Hematology Recent Labs  Lab 07/10/23 2036 07/11/23 0449 07/15/23 0218  WBC 9.9 9.9 9.8  RBC 5.16 4.91 4.54  HGB 16.1 15.2 13.7  HCT 47.9 45.8 42.3  MCV 92.8 93.3 93.2  MCH 31.2 31.0 30.2  MCHC 33.6 33.2 32.4  RDW 16.8* 16.8* 16.5*  PLT 232 193 190   Thyroid  Recent Labs  Lab 07/10/23 2036  TSH 4.268    BNP Recent Labs  Lab 07/10/23 1549  BNP >4,500.0*      Radiology    No results found.   Patient Profile     58 y.o. male with PMH of CAD, bipolar, CVA, substance abuse, hypertension, noncompliance for evaluation of acute on chronic diastolic CHF. Seen recently in the office and echo ordered but not  performed.  Assessment & Plan    1 Ischemic Acute on chronic systolic CHF-pt has been noncompliant with meds. Significantly volume overloaded.  -Echo showed EF 30% with basal to mid inferior/anterior lateral akinesis -Net +465 L, total net -9.6 L; weight is up dont think # is accurate.  Creatinine 1.8-1.39->1.43 -> now stable around 1.5-1.6 -Continue Lasix 80 mg BID IV -On jardiance, aldactone, carvedilol  2 hypertension-  In the setting of cocaine withdrawal Better controlled -Continue Coreg 12.5 mg twice daily, aldactone 12.5 mg daily  3 acute on chronic stage 3a kidney disease-follow renal function with diuresis.  Complicated with recent cocaine use  4 cocaine abuse-needs to avoid.   5 elevated troponin-minimal; no plans for ischemia eval at this point. He would need to demonstrate compliance with meds and FU prior to procedures.  6 H/O CAD-continue ASA 81 mg daily and statin.  7 NSVT -K>4, Mg>2 -No further arrhythmias noted overnight  8 Nocturnal hypoxia - consider overnight oximetry and outpatient sleep study - he reports snoring and gasping for breath (however, in the setting of decompensated heart failure).  Time Spent Directly with Patient:  I have spent a total of 35 minutes with the patient reviewing hospital notes, telemetry, EKGs, labs and examining the patient as well as establishing an assessment and plan that was discussed personally with the patient.     For questions or updates, please contact [facility] Please consult [email or web address] for contact info under   Chrystie Nose, MD, [facility]agros Loll  [place]  [facility]  Medical Director of the [facility]  [facility] Diplomate of the American Board  of Clinical Lipidology Attending Cardiologist  Direct Dial: [phone]  Fax: [phone]  Website:  [email or web address]  Chrystie Nose, MD  07/17/2023, 9:52 AM

## 2023-07-17 NOTE — Progress Notes (Signed)
PROGRESS NOTE    NARAYAN SCULL  NFA:[id number] DOB: [birth year] DOA: 07/10/2023 PCP: Donell Beers, FNP  58/M w/ history of cocaine abuse, hypertension, CAD, CVA, T2DM and bipolar disorder who presented with lower extremity edema. Noncompliant w medical therapy or outpatient follow up. IN ED, Hypertensive, positive lower extremity edema. bun 52 cr 1,85 , BNP >4,500, troponin 91 and 78  Toxicology screen positive for cocaine   CXR wcardiomegaly with bilateral hilar vascular congestion, bilateral central interstitial infiltrates with cephalization in the vasculature and fluid in the right fissure.  -Improving with diuresis, Unna boots  Subjective: -Feels better overall, swelling improving  Assessment and Plan:  Acute on chronic systolic CHF  -Echo with reduced LV systolic function 30%, LV with moderate to severe reduction in systolic function, diffuse hypokinesis, akinesis of the basal to mid infero lateral and antero lateral wall. RV systolic function low normal, Moderate to severe MR,  - 21 L negative, weight likely inaccurate, remains volume overloaded, continue Lasix 80 Mg twice daily, Jardiance, add low-dose Aldactone  -Discontinued hydralazine yesterday -Cards following, no plan for ischemic workup at this time -Increase activity, BMP in a.m. -TOC clinic follow-up made  Hypoalbuminemia -likely combination of malnutrition and chronic liver disease  CAD (coronary artery disease) Continue aspirin and statin  High sensitive troponin elevation due to heart failure, ruled out acute coronary syndrome.   Hypertension Meds as above  AKI (acute kidney injury) (HCC) -Likely cardiorenal, improving with diuresis  DM II (diabetes mellitus, type II), controlled (HCC) Improving, continue SSI, Jardiance  Anxiety Continue buspirone.   Cigarette smoker Nicotine patch counseling once patient is stable.  Cocaine abuse (HCC) Counseled  GERD (gastroesophageal reflux  disease) Continue pantoprazole.   Protein-calorie malnutrition, severe Continue with nutritional supplements.   Elevated LFTs Transaminitis secondary to passive hepatic congestion from CHF exacerbation.  CVA (cerebral vascular accident) (HCC) - aspirin and statin   DVT prophylaxis: Hep SQ Code Status: Full Code Family Communication: None present Disposition Plan: Home likely 48 hours  Consultants:    Procedures:   Antimicrobials:    Objective: Vitals:   07/16/23 1928 07/16/23 2236 07/17/23 0525 07/17/23 0740  BP: 122/82 137/87 (!) 128/96 125/88  Pulse: 77 76 80 70  Resp: 20 (!) 24 16 18   Temp: 97.6 F (36.4 C) 98.4 F (36.9 C) 97.9 F (36.6 C) 98.3 F (36.8 C)  TempSrc: Axillary Oral Oral Oral  SpO2: 97% 96% 98% 98%  Weight:   79.9 kg   Height:        Intake/Output Summary (Last 24 hours) at 07/17/2023 1009 Last data filed at 07/17/2023 0750 Gross per 24 hour  Intake 1080 ml  Output 4890 ml  Net -3810 ml   Filed Weights   07/15/23 0532 07/16/23 0455 07/17/23 0525  Weight: 81.6 kg 79.9 kg 79.9 kg    Examination:  General exam: Chronically ill male sitting up in bed, AAOx3 HEENT: Positive JVD CVS: S1-S2, regular rhythm Lungs: Decreased breath sounds to bases Abdomen: Soft, nontender, bowel sounds present Extremities: 2+ edema, Unna boots off this morning  Skin: No rashes Psychiatry:  Mood & affect appropriate.     Data Reviewed:   CBC: Recent Labs  Lab 07/10/23 1549 07/10/23 2036 07/11/23 0449 07/15/23 0218  WBC 9.5 9.9 9.9 9.8  HGB 14.8 16.1 15.2 13.7  HCT 44.9 47.9 45.8 42.3  MCV 93.7 92.8 93.3 93.2  PLT 204 232 193 190   Basic Metabolic Panel: Recent Labs  Lab 07/12/23 0209 07/13/23  1308 07/14/23 0214 07/15/23 0218 07/16/23 0753 07/17/23 0203  NA 135 138 135 136 135 135  K 3.7 3.5 3.6 4.1 4.3 4.0  CL 104 104 104 104 98 98  CO2 22 25 23  20* 26 24  GLUCOSE 161* 134* 157* 111* 206* 107*  BUN 38* 38* 39* 43* 49* 57*  CREATININE  1.82* 1.39* 1.43* 1.56* 1.60* 1.59*  CALCIUM 8.2* 8.4* 7.9* 8.1* 8.5* 8.9  MG 2.1 2.0 2.0  --   --   --    GFR: Estimated Creatinine Clearance: 57.9 mL/min (A) (by C-G formula based on SCr of 1.59 mg/dL (H)). Liver Function Tests: Recent Labs  Lab 07/10/23 1549 07/11/23 0449 07/15/23 0218  AST 50* 39 31  ALT 54* 48* 28  ALKPHOS 116 115 84  BILITOT 1.5* 1.3* 1.1  PROT 5.2* 5.5* 5.5*  ALBUMIN 2.7* 2.7* 2.5*   No results for input(s): "LIPASE", "AMYLASE" in the last 168 hours. No results for input(s): "AMMONIA" in the last 168 hours. Coagulation Profile: Recent Labs  Lab 07/15/23 0218  INR 1.0   Cardiac Enzymes: No results for input(s): "CKTOTAL", "CKMB", "CKMBINDEX", "TROPONINI" in the last 168 hours. BNP (last 3 results) Recent Labs    05/28/23 0850  PROBNP 24,067*   HbA1C: No results for input(s): "HGBA1C" in the last 72 hours. CBG: Recent Labs  Lab 07/16/23 0615 07/16/23 1135 07/16/23 1549 07/16/23 2053 07/17/23 0535  GLUCAP 161* 172* 210* 160* 131*   Lipid Profile: No results for input(s): "CHOL", "HDL", "LDLCALC", "TRIG", "CHOLHDL", "LDLDIRECT" in the last 72 hours.  Thyroid Function Tests: No results for input(s): "TSH", "T4TOTAL", "FREET4", "T3FREE", "THYROIDAB" in the last 72 hours.  Anemia Panel: No results for input(s): "VITAMINB12", "FOLATE", "FERRITIN", "TIBC", "IRON", "RETICCTPCT" in the last 72 hours. Urine analysis:    Component Value Date/Time   COLORURINE YELLOW 07/10/2023 1555   APPEARANCEUR CLEAR 07/10/2023 1555   LABSPEC 1.014 07/10/2023 1555   PHURINE 5.0 07/10/2023 1555   GLUCOSEU NEGATIVE 07/10/2023 1555   HGBUR SMALL (A) 07/10/2023 1555   BILIRUBINUR NEGATIVE 07/10/2023 1555   KETONESUR NEGATIVE 07/10/2023 1555   PROTEINUR 100 (A) 07/10/2023 1555   UROBILINOGEN 1.0 03/02/2010 2212   NITRITE NEGATIVE 07/10/2023 1555   LEUKOCYTESUR NEGATIVE 07/10/2023 1555   Sepsis Labs: @LABRCNTIP (procalcitonin:4,lacticidven:4)  ) Recent  Results (from the past 240 hours)  Resp panel by RT-PCR (RSV, Flu A&B, Covid) Anterior Nasal Swab     Status: None   Collection Time: 07/10/23  3:49 PM   Specimen: Anterior Nasal Swab  Result Value Ref Range Status   SARS Coronavirus 2 by RT PCR NEGATIVE NEGATIVE Final   Influenza A by PCR NEGATIVE NEGATIVE Final   Influenza B by PCR NEGATIVE NEGATIVE Final    Comment: (NOTE) The Xpert Xpress SARS-CoV-2/FLU/RSV plus assay is intended as an aid in the diagnosis of influenza from Nasopharyngeal swab specimens and should not be used as a sole basis for treatment. Nasal washings and aspirates are unacceptable for Xpert Xpress SARS-CoV-2/FLU/RSV testing.  Fact Sheet for Patients: BloggerCourse.com  Fact Sheet for Healthcare Providers: SeriousBroker.it  This test is not yet approved or cleared by the [place] FDA and has been authorized for detection and/or diagnosis of SARS-CoV-2 by FDA under an Emergency Use Authorization (EUA). This EUA will remain in effect (meaning this test can be used) for the duration of the COVID-19 declaration under Section 564(b)(1) of the Act, 21 U.S.C. section 360bbb-3(b)(1), unless the authorization is terminated or revoked.  Resp Syncytial Virus by PCR NEGATIVE NEGATIVE Final    Comment: (NOTE) Fact Sheet for Patients: BloggerCourse.com  Fact Sheet for Healthcare Providers: SeriousBroker.it  This test is not yet approved or cleared by the [place] FDA and has been authorized for detection and/or diagnosis of SARS-CoV-2 by FDA under an Emergency Use Authorization (EUA). This EUA will remain in effect (meaning this test can be used) for the duration of the COVID-19 declaration under Section 564(b)(1) of the Act, 21 U.S.C. section 360bbb-3(b)(1), unless the authorization is terminated or revoked.  Performed at [facility], [address], [place], [place] [address]   MRSA Next Gen by PCR, Nasal     Status: None   Collection Time: 07/11/23  5:45 PM   Specimen: Nasal Mucosa; Nasal Swab  Result Value Ref Range Status   MRSA by PCR Next Gen NOT DETECTED NOT DETECTED Final    Comment: (NOTE) The GeneXpert MRSA Assay (FDA approved for NASAL specimens only), is one component of a comprehensive MRSA colonization surveillance program. It is not intended to diagnose MRSA infection nor to guide or monitor treatment for MRSA infections. Test performance is not FDA approved in patients less than 22 years old. Performed at [facility], [address] [place], [place] [address]      Radiology Studies: No results found.    Scheduled Meds:  aspirin  81 mg Oral Daily   carvedilol  12.5 mg Oral BID WC   empagliflozin  10 mg Oral Daily   feeding supplement  1 Container Oral TID BM   finasteride  5 mg Oral Daily   furosemide  80 mg Intravenous BID   heparin  5,000 Units Subcutaneous Q8H   insulin aspart  0-15 Units Subcutaneous TID WC   nicotine  14 mg Transdermal Daily   pantoprazole  40 mg Oral QHS   rosuvastatin  40 mg Oral Daily   sodium chloride flush  3 mL Intravenous Q12H   spironolactone  12.5 mg Oral Daily   tamsulosin  0.8 mg Oral QHS   Continuous Infusions:   LOS: 7 days    Time spent:    Zannie Cove, MD [facility]   07/17/2023, 10:09 AM

## 2023-07-18 DIAGNOSIS — N179 Acute kidney failure, unspecified: Secondary | ICD-10-CM | POA: Diagnosis not present

## 2023-07-18 DIAGNOSIS — I5023 Acute on chronic systolic (congestive) heart failure: Secondary | ICD-10-CM | POA: Diagnosis not present

## 2023-07-18 DIAGNOSIS — I4729 Other ventricular tachycardia: Secondary | ICD-10-CM | POA: Diagnosis not present

## 2023-07-18 DIAGNOSIS — I2510 Atherosclerotic heart disease of native coronary artery without angina pectoris: Secondary | ICD-10-CM | POA: Diagnosis not present

## 2023-07-18 LAB — BASIC METABOLIC PANEL
Anion gap: 10 (ref 5–15)
BUN: 59 mg/dL — ABNORMAL HIGH (ref 6–20)
CO2: 27 mmol/L (ref 22–32)
Calcium: 9.1 mg/dL (ref 8.9–10.3)
Chloride: 97 mmol/L — ABNORMAL LOW (ref 98–111)
Creatinine, Ser: 1.63 mg/dL — ABNORMAL HIGH (ref 0.61–1.24)
GFR, Estimated: 49 mL/min — ABNORMAL LOW (ref >60)
Glucose, Bld: 137 mg/dL — ABNORMAL HIGH (ref 70–99)
Potassium: 4.6 mmol/L (ref 3.5–5.1)
Sodium: 134 mmol/L — ABNORMAL LOW (ref 135–145)

## 2023-07-18 LAB — GLUCOSE, CAPILLARY
Glucose-Capillary: 141 mg/dL — ABNORMAL HIGH (ref 70–99)
Glucose-Capillary: 193 mg/dL — ABNORMAL HIGH (ref 70–99)
Glucose-Capillary: 183 mg/dL — ABNORMAL HIGH (ref 70–99)
Glucose-Capillary: 133 mg/dL — ABNORMAL HIGH (ref 70–99)
Glucose-Capillary: 220 mg/dL — ABNORMAL HIGH (ref 70–99)

## 2023-07-18 LAB — MAGNESIUM: Magnesium: 2.3 mg/dL (ref 1.7–2.4)

## 2023-07-18 MED ORDER — CARVEDILOL 25 MG PO TABS
25.0000 mg | ORAL_TABLET | Freq: Two times a day (BID) | ORAL | Status: DC
  Administered 2023-07-18 – 2023-07-20 (×4): 25 mg via ORAL
  Filled 2023-07-18 (×4): qty 1

## 2023-07-18 NOTE — Plan of Care (Signed)
  Problem: Skin Integrity: Goal: Risk for impaired skin integrity will decrease Outcome: Progressing   Problem: Education: Goal: Knowledge of General Education information will improve Description: Including pain rating scale, medication(s)/side effects and non-pharmacologic comfort measures Outcome: Progressing   Problem: Clinical Measurements: Goal: Will remain free from infection Outcome: Progressing Goal: Diagnostic test results will improve Outcome: Progressing

## 2023-07-18 NOTE — Progress Notes (Signed)
PROGRESS NOTE    [name]  ZOX:[id number] DOB: Nov 15, 1965 DOA: 07/10/2023 PCP: Donell Beers, FNP  57/M w/ history of cocaine abuse, hypertension, CAD, CVA, T2DM and bipolar disorder who presented with lower extremity edema. Noncompliant w medical therapy or outpatient follow up. IN ED, Hypertensive, positive lower extremity edema. bun 52 cr 1,85 , BNP >4,500, troponin 91 and 78  Toxicology screen positive for cocaine   CXR wcardiomegaly with bilateral hilar vascular congestion, bilateral central interstitial infiltrates with cephalization in the vasculature and fluid in the right fissure.  -Echo with new CHF, EF 30%, +wall motion abnormality, moderate to severe MR -Improving with diuresis, Unna boots  Subjective: -Swelling improving, fixated on diet, wants more than double portions  Assessment and Plan:  Acute on chronic systolic CHF  -Echo with reduced LV systolic function 30%, LV with moderate to severe reduction in systolic function, diffuse hypokinesis, akinesis of the basal to mid infero lateral and antero lateral wall. RV systolic function low normal, Moderate to severe MR,  -24.7  L negative, weight likely inaccurate, remains volume overloaded, continue Lasix 80 Mg twice daily, Jardiance, Aldactone, carvedilol> dose increased -Cards following, no plan for ischemic workup at this time -Increase activity, BMP in a.m. -TOC clinic follow-up made  Hypoalbuminemia -likely combination of malnutrition and chronic liver disease  CAD (coronary artery disease) Continue aspirin and statin  High sensitive troponin elevation due to heart failure, ruled out acute coronary syndrome.   Hypertension Meds as above  AKI (acute kidney injury) (HCC) -Likely cardiorenal, improving with diuresis  DM II (diabetes mellitus, type II), controlled (HCC) Improving, continue SSI, Jardiance  Anxiety Continue buspirone.   Cigarette smoker Nicotine patch counseling once patient is  stable.  Cocaine abuse (HCC) Counseled  GERD (gastroesophageal reflux disease) Continue pantoprazole.   Protein-calorie malnutrition, severe Continue with nutritional supplements.   Elevated LFTs Transaminitis secondary to passive hepatic congestion from CHF exacerbation.  CVA (cerebral vascular accident) (HCC) - aspirin and statin   DVT prophylaxis: Hep SQ Code Status: Full Code Family Communication: None present Disposition Plan: Home likely 48 hours  Consultants:    Procedures:   Antimicrobials:    Objective: Vitals:   07/17/23 2312 07/18/23 0256 07/18/23 0500 07/18/23 0825  BP: (!) 134/91 (!) 120/91    Pulse: 69 75 73   Resp: 19 20  19   Temp: 97.6 F (36.4 C) 97.7 F (36.5 C)  98.1 F (36.7 C)  TempSrc: Oral Oral  Oral  SpO2: 98% 95% 98%   Weight:   77.3 kg   Height:        Intake/Output Summary (Last 24 hours) at 07/18/2023 1118 Last data filed at 07/18/2023 8119 Gross per 24 hour  Intake 820 ml  Output 3400 ml  Net -2580 ml   Filed Weights   07/16/23 0455 07/17/23 0525 07/18/23 0500  Weight: 79.9 kg 79.9 kg 77.3 kg    Examination:  General exam: Chronically ill male sitting up in bed, AAOx3 HEENT: Positive JVD CVS: S1-S2, regular rhythm Lungs: Decreased breath sounds at the bases Abdomen: Soft, nontender, bowel sounds present Remedies: 2+ edema, Unna boots on  Skin: No rashes Psychiatry:  Mood & affect appropriate.     Data Reviewed:   CBC: Recent Labs  Lab 07/15/23 0218  WBC 9.8  HGB 13.7  HCT 42.3  MCV 93.2  PLT 190   Basic Metabolic Panel: Recent Labs  Lab 07/12/23 0209 07/13/23 0211 07/14/23 0214 07/15/23 1478 07/16/23 0753 07/17/23 0203 07/18/23  0211  NA 135 138 135 136 135 135 134*  K 3.7 3.5 3.6 4.1 4.3 4.0 4.6  CL 104 104 104 104 98 98 97*  CO2 22 25 23  20* 26 24 27   GLUCOSE 161* 134* 157* 111* 206* 107* 137*  BUN 38* 38* 39* 43* 49* 57* 59*  CREATININE 1.82* 1.39* 1.43* 1.56* 1.60* 1.59* 1.63*  CALCIUM 8.2*  8.4* 7.9* 8.1* 8.5* 8.9 9.1  MG 2.1 2.0 2.0  --   --   --  2.3   GFR: Estimated Creatinine Clearance: 54.7 mL/min (A) (by C-G formula based on SCr of 1.63 mg/dL (H)). Liver Function Tests: Recent Labs  Lab 07/15/23 0218  AST 31  ALT 28  ALKPHOS 84  BILITOT 1.1  PROT 5.5*  ALBUMIN 2.5*   No results for input(s): "LIPASE", "AMYLASE" in the last 168 hours. No results for input(s): "AMMONIA" in the last 168 hours. Coagulation Profile: Recent Labs  Lab 07/15/23 0218  INR 1.0   Cardiac Enzymes: No results for input(s): "CKTOTAL", "CKMB", "CKMBINDEX", "TROPONINI" in the last 168 hours. BNP (last 3 results) Recent Labs    05/28/23 0850  PROBNP 24,067*   HbA1C: No results for input(s): "HGBA1C" in the last 72 hours. CBG: Recent Labs  Lab 07/17/23 1147 07/17/23 1545 07/17/23 2118 07/18/23 0610 07/18/23 0831  GLUCAP 113* 159* 177* 141* 193*   Lipid Profile: No results for input(s): "CHOL", "HDL", "LDLCALC", "TRIG", "CHOLHDL", "LDLDIRECT" in the last 72 hours.  Thyroid Function Tests: No results for input(s): "TSH", "T4TOTAL", "FREET4", "T3FREE", "THYROIDAB" in the last 72 hours.  Anemia Panel: No results for input(s): "VITAMINB12", "FOLATE", "FERRITIN", "TIBC", "IRON", "RETICCTPCT" in the last 72 hours. Urine analysis:    Component Value Date/Time   COLORURINE YELLOW 07/10/2023 1555   APPEARANCEUR CLEAR 07/10/2023 1555   LABSPEC 1.014 07/10/2023 1555   PHURINE 5.0 07/10/2023 1555   GLUCOSEU NEGATIVE 07/10/2023 1555   HGBUR SMALL (A) 07/10/2023 1555   BILIRUBINUR NEGATIVE 07/10/2023 1555   KETONESUR NEGATIVE 07/10/2023 1555   PROTEINUR 100 (A) 07/10/2023 1555   UROBILINOGEN 1.0 03/02/2010 2212   NITRITE NEGATIVE 07/10/2023 1555   LEUKOCYTESUR NEGATIVE 07/10/2023 1555   Sepsis Labs: @LABRCNTIP (procalcitonin:4,lacticidven:4)  ) Recent Results (from the past 240 hours)  Resp panel by RT-PCR (RSV, Flu A&B, Covid) Anterior Nasal Swab     Status: None    Collection Time: 07/10/23  3:49 PM   Specimen: Anterior Nasal Swab  Result Value Ref Range Status   SARS Coronavirus 2 by RT PCR NEGATIVE NEGATIVE Final   Influenza A by PCR NEGATIVE NEGATIVE Final   Influenza B by PCR NEGATIVE NEGATIVE Final    Comment: (NOTE) The Xpert Xpress SARS-CoV-2/FLU/RSV plus assay is intended as an aid in the diagnosis of influenza from Nasopharyngeal swab specimens and should not be used as a sole basis for treatment. Nasal washings and aspirates are unacceptable for Xpert Xpress SARS-CoV-2/FLU/RSV testing.  Fact Sheet for Patients: BloggerCourse.com  Fact Sheet for Healthcare Providers: SeriousBroker.it  This test is not yet approved or cleared by the [place] FDA and has been authorized for detection and/or diagnosis of SARS-CoV-2 by FDA under an Emergency Use Authorization (EUA). This EUA will remain in effect (meaning this test can be used) for the duration of the COVID-19 declaration under Section 564(b)(1) of the Act, 21 U.S.C. section 360bbb-3(b)(1), unless the authorization is terminated or revoked.     Resp Syncytial Virus by PCR NEGATIVE NEGATIVE Final    Comment: (NOTE) Fact  Sheet for Patients: BloggerCourse.com  Fact Sheet for Healthcare Providers: SeriousBroker.it  This test is not yet approved or cleared by the [place] FDA and has been authorized for detection and/or diagnosis of SARS-CoV-2 by FDA under an Emergency Use Authorization (EUA). This EUA will remain in effect (meaning this test can be used) for the duration of the COVID-19 declaration under Section 564(b)(1) of the Act, 21 U.S.C. section 360bbb-3(b)(1), unless the authorization is terminated or revoked.  Performed at [facility], [address], [place], [place] [address]   MRSA Next Gen by PCR, Nasal     Status: None   Collection Time: 07/11/23  5:45  PM   Specimen: Nasal Mucosa; Nasal Swab  Result Value Ref Range Status   MRSA by PCR Next Gen NOT DETECTED NOT DETECTED Final    Comment: (NOTE) The GeneXpert MRSA Assay (FDA approved for NASAL specimens only), is one component of a comprehensive MRSA colonization surveillance program. It is not intended to diagnose MRSA infection nor to guide or monitor treatment for MRSA infections. Test performance is not FDA approved in patients less than 35 years old. Performed at [facility], [address], [place], [place] [address]      Radiology Studies: No results found.    Scheduled Meds:  aspirin  81 mg Oral Daily   carvedilol  25 mg Oral BID WC   empagliflozin  10 mg Oral Daily   feeding supplement  1 Container Oral TID BM   finasteride  5 mg Oral Daily   furosemide  80 mg Intravenous BID   heparin  5,000 Units Subcutaneous Q8H   insulin aspart  0-15 Units Subcutaneous TID WC   nicotine  14 mg Transdermal Daily   pantoprazole  40 mg Oral QHS   rosuvastatin  40 mg Oral Daily   sodium chloride flush  3 mL Intravenous Q12H   spironolactone  12.5 mg Oral Daily   tamsulosin  0.8 mg Oral QHS   Continuous Infusions:   LOS: 8 days    Time spent:    Zannie Cove, MD [facility]   07/18/2023, 11:18 AM

## 2023-07-18 NOTE — Progress Notes (Signed)
Rounding Note   Patient Name: [name] Date of Encounter: 07/18/2023  [facility] Cardiologist: Dr Tomie China  Subjective   No complaints overnight. Diuresed another 3.7L, now 24L negative.  Creatinine has remained relatively stable at 1.63 today. Slight drop in sodium to 134.  Inpatient Medications    Scheduled Meds:  aspirin  81 mg Oral Daily   carvedilol  12.5 mg Oral BID WC   empagliflozin  10 mg Oral Daily   feeding supplement  1 Container Oral TID BM   finasteride  5 mg Oral Daily   furosemide  80 mg Intravenous BID   heparin  5,000 Units Subcutaneous Q8H   insulin aspart  0-15 Units Subcutaneous TID WC   nicotine  14 mg Transdermal Daily   pantoprazole  40 mg Oral QHS   rosuvastatin  40 mg Oral Daily   sodium chloride flush  3 mL Intravenous Q12H   spironolactone  12.5 mg Oral Daily   tamsulosin  0.8 mg Oral QHS   Continuous Infusions:  PRN Meds: acetaminophen **OR** acetaminophen, albuterol, hydrALAZINE, nitroGLYCERIN, ondansetron **OR** ondansetron (ZOFRAN) IV   Vital Signs    Vitals:   07/17/23 1941 07/17/23 2312 07/18/23 0256 07/18/23 0500  BP: (!) 121/91 (!) 134/91 (!) 120/91   Pulse: 68 69 75 73  Resp: 15 19 20    Temp: 98 F (36.7 C) 97.6 F (36.4 C) 97.7 F (36.5 C)   TempSrc: Oral Oral Oral   SpO2: 98% 98% 95% 98%  Weight:    77.3 kg  Height:        Intake/Output Summary (Last 24 hours) at 07/18/2023 0756 Last data filed at 07/18/2023 0537 Gross per 24 hour  Intake 820 ml  Output 4200 ml  Net -3380 ml      07/18/2023    5:00 AM 07/17/2023    5:25 AM 07/16/2023    4:55 AM  Last 3 Weights  Weight (lbs) 170 lb 6.7 oz 176 lb 2.4 oz 176 lb 2.4 oz  Weight (kg) 77.3 kg 79.9 kg 79.9 kg      Telemetry    Sinus rhythm - Personally Reviewed  Physical Exam   General appearance: alert and no distress Lungs: clear to auscultation bilaterally Heart: regular rate and rhythm Extremities: edema 1-2+ bilateral LE, wrapped  legs Neurologic: Grossly normal   Labs    High Sensitivity Troponin:   Recent Labs  Lab 07/10/23 1549 07/10/23 1752  TROPONINIHS 91* 78*     Chemistry Recent Labs  Lab 07/12/23 0209 07/13/23 0211 07/14/23 0214 07/15/23 0218 07/16/23 0753 07/17/23 0203 07/18/23 0211  NA 135 138 135 136 135 135 134*  K 3.7 3.5 3.6 4.1 4.3 4.0 4.6  CL 104 104 104 104 98 98 97*  CO2 22 25 23  20* 26 24 27   GLUCOSE 161* 134* 157* 111* 206* 107* 137*  BUN 38* 38* 39* 43* 49* 57* 59*  CREATININE 1.82* 1.39* 1.43* 1.56* 1.60* 1.59* 1.63*  CALCIUM 8.2* 8.4* 7.9* 8.1* 8.5* 8.9 9.1  MG 2.1 2.0 2.0  --   --   --   --   PROT  --   --   --  5.5*  --   --   --   ALBUMIN  --   --   --  2.5*  --   --   --   AST  --   --   --  31  --   --   --  ALT  --   --   --  28  --   --   --   ALKPHOS  --   --   --  84  --   --   --   BILITOT  --   --   --  1.1  --   --   --   GFRNONAA 43* 59* 57* 51* 50* 50* 49*  ANIONGAP 9 9 8 12 11 13 10     Lipids  No results for input(s): "CHOL", "TRIG", "HDL", "LABVLDL", "LDLCALC", "CHOLHDL" in the last 168 hours.   Hematology Recent Labs  Lab 07/15/23 0218  WBC 9.8  RBC 4.54  HGB 13.7  HCT 42.3  MCV 93.2  MCH 30.2  MCHC 32.4  RDW 16.5*  PLT 190   Thyroid  No results for input(s): "TSH", "FREET4" in the last 168 hours.   BNP No results for input(s): "BNP", "PROBNP" in the last 168 hours.     Radiology    No results found.   Patient Profile     58 y.o. male with PMH of CAD, bipolar, CVA, substance abuse, hypertension, noncompliance for evaluation of acute on chronic diastolic CHF. Seen recently in the office and echo ordered but not performed.  Assessment & Plan    1 Ischemic Acute on chronic systolic CHF-pt has been noncompliant with meds. Significantly volume overloaded.  -Echo showed EF 30% with basal to mid inferior/anterior lateral akinesis -Diuresed around 27L total. Creatinine 1.8-1.39->1.43 -> now stable around 1.5-1.6 -Continue Lasix 80  mg BID IV - good urine output , relatively stable creatinine, sodium 134 today -On jardiance, aldactone, carvedilol -Would arrange for outpatient ischemia evaluation given history of CAD, prior MI (Reported stent) and depressed LVEF  2 hypertension-  In the setting of cocaine withdrawal Better controlled, but still some diastolic hypertension -Increase coreg to 25 mg twice daily, continue aldactone 12.5 mg daily  3 acute on chronic stage 3a kidney disease-follow renal function with diuresis.  Complicated with recent cocaine use  4 cocaine abuse-needs to avoid.   5 elevated troponin-minimal; no plans for ischemia eval at this point. He would need to demonstrate compliance with meds and FU prior to procedures.  6 H/O CAD-continue ASA 81 mg daily and statin.  7 NSVT -K>4, Mg>2 -He had more NSVT overnight - add magnesium to labs, potassium ok -Echo suggests prior infarct, the is likely the source. -Could consider LifeVest or AICD, however, non-compliance appears to be a major deterrent to this - Will increase carvedilol to 25 mg BID  8 Nocturnal hypoxia - consider overnight oximetry and outpatient sleep study - he reports snoring and gasping for breath (however, in the setting of decompensated heart failure).  Time Spent Directly with Patient:  I have spent a total of 35 minutes with the patient reviewing hospital notes, telemetry, EKGs, labs and examining the patient as well as establishing an assessment and plan that was discussed personally with the patient.     For questions or updates, please contact [facility] Please consult [email or web address] for contact info under   Chrystie Nose, MD, Milagros Loll  [place]  [facility]  Medical Director of the Advanced Lipid Disorders &  Cardiovascular Risk Reduction Clinic Diplomate of the American Board of Clinical Lipidology Attending Cardiologist  Direct Dial: [phone]  Fax: [phone]  Website:   [email or web address]  Chrystie Nose, MD  07/18/2023, 7:56 AM

## 2023-07-18 NOTE — Progress Notes (Signed)
Mobility Specialist Progress Note;   07/18/23 0905  Mobility  Activity Ambulated with assistance in hallway  Level of Assistance Standby assist, set-up cues, supervision of patient - no hands on  Assistive Device Front wheel walker  Distance Ambulated (ft) 100 ft  Activity Response Tolerated well  Mobility Referral Yes  Mobility visit 1 Mobility  Mobility Specialist Start Time (ACUTE ONLY) 0905  Mobility Specialist Stop Time (ACUTE ONLY) 0912  Mobility Specialist Time Calculation (min) (ACUTE ONLY) 7 min   Pt agreeable to mobility. Requested to use RW during session. Required no physical assistance during ambulation, SV. VSS throughout and no c/o during session. Pt returned back to EOB w/ all needs met.   [name] Mobility Specialist Please contact via SecureChat or [employer] [phone]

## 2023-07-19 DIAGNOSIS — I4729 Other ventricular tachycardia: Secondary | ICD-10-CM

## 2023-07-19 DIAGNOSIS — I5023 Acute on chronic systolic (congestive) heart failure: Secondary | ICD-10-CM | POA: Diagnosis not present

## 2023-07-19 HISTORY — DX: Other ventricular tachycardia: I47.29

## 2023-07-19 LAB — BASIC METABOLIC PANEL
Anion gap: 12 (ref 5–15)
BUN: 65 mg/dL — ABNORMAL HIGH (ref 6–20)
CO2: 25 mmol/L (ref 22–32)
Calcium: 8.7 mg/dL — ABNORMAL LOW (ref 8.9–10.3)
Chloride: 98 mmol/L (ref 98–111)
Creatinine, Ser: 1.66 mg/dL — ABNORMAL HIGH (ref 0.61–1.24)
GFR, Estimated: 48 mL/min — ABNORMAL LOW (ref >60)
Glucose, Bld: 194 mg/dL — ABNORMAL HIGH (ref 70–99)
Potassium: 3.8 mmol/L (ref 3.5–5.1)
Sodium: 135 mmol/L (ref 135–145)

## 2023-07-19 LAB — GLUCOSE, CAPILLARY
Glucose-Capillary: 198 mg/dL — ABNORMAL HIGH (ref 70–99)
Glucose-Capillary: 256 mg/dL — ABNORMAL HIGH (ref 70–99)
Glucose-Capillary: 130 mg/dL — ABNORMAL HIGH (ref 70–99)
Glucose-Capillary: 133 mg/dL — ABNORMAL HIGH (ref 70–99)

## 2023-07-19 MED ORDER — INSULIN GLARGINE-YFGN 100 UNIT/ML ~~LOC~~ SOLN
10.0000 [IU] | Freq: Every day | SUBCUTANEOUS | Status: DC
  Administered 2023-07-19 – 2023-07-22 (×4): 10 [IU] via SUBCUTANEOUS
  Filled 2023-07-19 (×4): qty 0.1

## 2023-07-19 NOTE — Progress Notes (Signed)
Mobility Specialist Progress Note;   07/19/23 1040  Mobility  Activity Ambulated with assistance in hallway  Level of Assistance Contact guard assist, steadying assist  Assistive Device None  Distance Ambulated (ft) 100 ft  Activity Response Tolerated well  Mobility Referral Yes  Mobility visit 1 Mobility  Mobility Specialist Start Time (ACUTE ONLY) 1040  Mobility Specialist Stop Time (ACUTE ONLY) 1046  Mobility Specialist Time Calculation (min) (ACUTE ONLY) 6 min   Pt agreeable to mobility. Required MinG assistance during ambulation via gait belt for steadiness. VSS throughout. Only c/o wanting to eat lunch. Otherwise, asx. Pt returned back to bed with all needs met.   Caesar Bookman Mobility Specialist Please contact via SecureChat or [employer] [phone]

## 2023-07-19 NOTE — Progress Notes (Addendum)
Nutrition Follow-up  DOCUMENTATION CODES:   Severe malnutrition in context of chronic illness  INTERVENTION:  Double portion proteins Magic Cup TID QHS snack  Discontinue Boost Breeze as patient is refusing  NUTRITION DIAGNOSIS:  Severe Malnutrition related to chronic illness as evidenced by energy intake < 75% for > or equal to 1 month, severe fat depletion, severe muscle depletion, percent weight loss. - remains applicable  GOAL:  Patient will meet greater than or equal to 90% of their needs - meeting/ongoing  MONITOR:  Supplement acceptance, PO intake, Weight trends  REASON FOR ASSESSMENT:  Consult Assessment of nutrition requirement/status  ASSESSMENT:  Pt presented to ED with c/o SOB and BLE swelling. Endorses noncompliance with medications x 3-4 weeks. Found to have heart failure exacerbation in setting of cocaine intoxication. PMH: T2DM, CHF, CVA, HTN, COPD, Hep C, CAD, hx of cocaine abuse.  CHO modified re-initiated as patient intake has picked up exponentially and blood sugars more volatile. Consuming 100% of meals, including double portions of proteins. Remains fixated on intake. Boost Breeze ordered over the weekend. Pt does not like. Also not amicable to additional supplementation. Will discontinue.   Admit Weight: 78.3kg Current Weight: 75.8kg  Admission high of 83kg. Diuresis continues. BLE edema improving, however significant. He continues with Unna boots. Transitioning to oral torsemide tomorrow. Remains on fluid restriction.   Continues to ask for additional food and beverage. Did discuss the need to limit fluid/sodium intake to continue to encourage improvement in fluid status and prevent prolonged admission. Discussed root cause of why he is admitted is fluid overload. Patient verbalizes understanding, however continues to state the reasoning for his requests.    Intake/Output Summary (Last 24 hours) at 07/19/2023 1520 Last data filed at 07/19/2023  1029 Gross per 24 hour  Intake 280 ml  Output 3365 ml  Net -3085 ml    Net IO Since Admission: -30,002 mL [07/19/23 1520]   Labs: Na+ 134>135 (wnl) K+ 3.8 (wnl) Mg 2.3 (wnl) CBGs 137-194 over 48 hours A1c 6.9 (05/2023) Crt 1.59>1.63>1.66  Meds: furosemide, SSI, semglee, pantoprazole, spironolactone  Diet Order:   Diet Order             Diet Carb Modified Fluid consistency: Thin; Room service appropriate? Yes; Fluid restriction: 1500 mL Fluid  Diet effective now             EDUCATION NEEDS:  Education needs have been addressed  Skin:  Skin Assessment: Reviewed RN Assessment  Last BM:  2/3  Height:  Ht Readings from Last 1 Encounters:  07/11/23 6\' 2"  (1.88 m)   Weight:  Wt Readings from Last 1 Encounters:  07/19/23 75.8 kg    Ideal Body Weight:  86.4 kg  BMI:  Body mass index is 21.46 kg/m.  Estimated Nutritional Needs:   Kcal:  2200-2400kcal  Protein:  120-130g  Fluid:  1.5L/day  Myrtie Cruise MS, RD, LDN Registered Dietitian Clinical Nutrition RD Inpatient Contact Info in Amion

## 2023-07-19 NOTE — Inpatient Diabetes Management (Addendum)
Inpatient Diabetes Program Recommendations  AACE/ADA: New Consensus Statement on Inpatient Glycemic Control   Target Ranges:  Prepandial:   less than 140 mg/dL      Peak postprandial:   less than 180 mg/dL (1-2 hours)      Critically ill patients:  140 - 180 mg/dL    Latest Reference Range & Units 07/18/23 08:31 07/18/23 11:41 07/18/23 16:18 07/18/23 21:25 07/19/23 05:46  Glucose-Capillary 70 - 99 mg/dL 409 (H) 811 (H) 914 (H) 220 (H) 198 (H)    Latest Reference Range & Units 05/28/23 08:50  Hemoglobin A1C 4.8 - 5.6 % 6.9 (H)   Review of Glycemic Control  Diabetes history: DM2 Outpatient Diabetes medications: None Current orders for Inpatient glycemic control: Novolog 0-15 units TID with meals, Jardiance 10 mg daily, Semglee 10 units daily  Inpatient Diabetes Program Recommendations:    Insulin: Bedtime CBG 220 mg/dl on 12/21/27 and fasting CBG 198 mg/dl today. Noted Semglee ordered today. Recommend to discontinue Semglee, add Novolog 0-5 units at bedtime for bedtime correction, discontinue Regular diet, and order Carb Modified diet.  HbgA1C:  A1C 6.9% on 05/28/23 and not taking any DM medications outpatient.  At discharge, recommend prescribing Jardiance 10 mg daily and have patient follow up with PCP regarding DM management.   Thanks, Orlando Penner, RN, MSN, CDCES Diabetes Coordinator Inpatient Diabetes Program [phone] (Team Pager from 8am to 5pm)

## 2023-07-19 NOTE — Progress Notes (Signed)
Rounding Note   Patient Name: [name] Date of Encounter: 07/19/2023  [facility] Cardiologist: Dr Tomie China  Subjective   Diuresed another 4.7L overnight- now 29L Negative.  Creatinine stable to slowly uptrending.  BUN 65.  Inpatient Medications    Scheduled Meds:  aspirin  81 mg Oral Daily   carvedilol  25 mg Oral BID WC   empagliflozin  10 mg Oral Daily   feeding supplement  1 Container Oral TID BM   finasteride  5 mg Oral Daily   furosemide  80 mg Intravenous BID   heparin  5,000 Units Subcutaneous Q8H   insulin aspart  0-15 Units Subcutaneous TID WC   insulin glargine-yfgn  10 Units Subcutaneous Daily   nicotine  14 mg Transdermal Daily   pantoprazole  40 mg Oral QHS   rosuvastatin  40 mg Oral Daily   sodium chloride flush  3 mL Intravenous Q12H   spironolactone  12.5 mg Oral Daily   tamsulosin  0.8 mg Oral QHS   Continuous Infusions:  PRN Meds: acetaminophen **OR** acetaminophen, albuterol, hydrALAZINE, nitroGLYCERIN, ondansetron **OR** ondansetron (ZOFRAN) IV   Vital Signs    Vitals:   07/19/23 0300 07/19/23 0345 07/19/23 0548 07/19/23 0759  BP: (!) 126/93  120/87 104/64  Pulse: 75  66 64  Resp: 20     Temp: 97.8 F (36.6 C)   97.9 F (36.6 C)  TempSrc: Oral   Oral  SpO2: 97%   100%  Weight:  75.8 kg    Height:        Intake/Output Summary (Last 24 hours) at 07/19/2023 0859 Last data filed at 07/19/2023 0557 Gross per 24 hour  Intake 500 ml  Output 5265 ml  Net -4765 ml      07/19/2023    3:45 AM 07/18/2023    5:00 AM 07/17/2023    5:25 AM  Last 3 Weights  Weight (lbs) 167 lb 1.7 oz 170 lb 6.7 oz 176 lb 2.4 oz  Weight (kg) 75.8 kg 77.3 kg 79.9 kg      Telemetry    Sinus rhythm, occasional short NSVT bursts - Personally Reviewed  Physical Exam   General appearance: alert and no distress Lungs: clear to auscultation bilaterally Heart: regular rate and rhythm Extremities: edema 2+ bilateral LE, wrapped legs Neurologic:  Grossly normal   Labs    High Sensitivity Troponin:   Recent Labs  Lab 07/10/23 1549 07/10/23 1752  TROPONINIHS 91* 78*     Chemistry Recent Labs  Lab 07/13/23 0211 07/14/23 0214 07/15/23 0218 07/16/23 0753 07/17/23 0203 07/18/23 0211 07/19/23 0203  NA 138 135 136   < > 135 134* 135  K 3.5 3.6 4.1   < > 4.0 4.6 3.8  CL 104 104 104   < > 98 97* 98  CO2 25 23 20*   < > 24 27 25   GLUCOSE 134* 157* 111*   < > 107* 137* 194*  BUN 38* 39* 43*   < > 57* 59* 65*  CREATININE 1.39* 1.43* 1.56*   < > 1.59* 1.63* 1.66*  CALCIUM 8.4* 7.9* 8.1*   < > 8.9 9.1 8.7*  MG 2.0 2.0  --   --   --  2.3  --   PROT  --   --  5.5*  --   --   --   --   ALBUMIN  --   --  2.5*  --   --   --   --  AST  --   --  31  --   --   --   --   ALT  --   --  28  --   --   --   --   ALKPHOS  --   --  84  --   --   --   --   BILITOT  --   --  1.1  --   --   --   --   GFRNONAA 59* 57* 51*   < > 50* 49* 48*  ANIONGAP 9 8 12    < > 13 10 12    < > = values in this interval not displayed.    Lipids  No results for input(s): "CHOL", "TRIG", "HDL", "LABVLDL", "LDLCALC", "CHOLHDL" in the last 168 hours.   Hematology Recent Labs  Lab 07/15/23 0218  WBC 9.8  RBC 4.54  HGB 13.7  HCT 42.3  MCV 93.2  MCH 30.2  MCHC 32.4  RDW 16.5*  PLT 190   Thyroid  No results for input(s): "TSH", "FREET4" in the last 168 hours.   BNP No results for input(s): "BNP", "PROBNP" in the last 168 hours.     Radiology    No results found.   Patient Profile     58 y.o. male with PMH of CAD, bipolar, CVA, substance abuse, hypertension, noncompliance for evaluation of acute on chronic diastolic CHF. Seen recently in the office and echo ordered but not performed.  Assessment & Plan    1 Ischemic Acute on chronic systolic CHF-pt has been noncompliant with meds. Significantly volume overloaded.  -Echo showed EF 30% with basal to mid inferior/anterior lateral akinesis -Diuresed around 27L total. Creatinine 1.8-1.39->1.43  -> now stable around 1.5-1.6 -Continue Lasix 80 mg BID IV - good urine output , relatively stable, would continue IV for 1 more day and then switch to oral diuretic tomorrow  -On jardiance, aldactone, carvedilol -Would arrange for outpatient ischemia evaluation given history of CAD, prior MI (Reported stent) and depressed LVEF  2 hypertension-  In the setting of cocaine withdrawal Better controlled, but still some diastolic hypertension -Increase coreg to 25 mg twice daily, continue aldactone 12.5 mg daily  3 acute on chronic stage 3a kidney disease-follow renal function with diuresis.  Complicated with recent cocaine use  4 cocaine abuse-needs to avoid.   5 elevated troponin-minimal; no plans for ischemia eval at this point. He would need to demonstrate compliance with meds and FU prior to procedures.  6 H/O CAD-continue ASA 81 mg daily and statin.  7 NSVT -K>4, Mg>2 -He had more NSVT overnight - add magnesium to labs, potassium ok -Echo suggests prior infarct, the is likely the source. -Could consider LifeVest or AICD, however, non-compliance appears to be a major deterrent to this - Continue carvedilol 25 mg BID -short bursts of NSVT overnight  8 Nocturnal hypoxia - consider overnight oximetry and outpatient sleep study - he reports snoring and gasping for breath (however, in the setting of decompensated heart failure).  Time Spent Directly with Patient:  I have spent a total of 35 minutes with the patient reviewing hospital notes, telemetry, EKGs, labs and examining the patient as well as establishing an assessment and plan that was discussed personally with the patient.     For questions or updates, please contact [facility] Please consult [email or web address] for contact info under   Chrystie Nose, MD, Milagros Loll  [place]  [facility] Director of  the Advanced Lipid Disorders &  Cardiovascular Risk Reduction Clinic Diplomate of the American Board of  Clinical Lipidology Attending Cardiologist  Direct Dial: [phone]  Fax: [phone]  Website:  [email or web address]  Chrystie Nose, MD  07/19/2023, 8:59 AM

## 2023-07-19 NOTE — Plan of Care (Signed)

## 2023-07-19 NOTE — Progress Notes (Signed)
Orthopedic Tech Progress Note Patient Details:  [name] 12-Mar-1966 [id number]  Ortho Devices Type of Ortho Device: Ace wrap, Unna boot Ortho Device/Splint Location: BLE Ortho Device/Splint Interventions: Ordered, Application, Adjustment   Post Interventions Patient Tolerated: Well Instructions Provided: Care of device  [name] 07/19/2023, 5:03 PM

## 2023-07-19 NOTE — Progress Notes (Addendum)
PROGRESS NOTE    [name]  KYH:[id number] DOB: [birth year] DOA: 07/10/2023 PCP: Donell Beers, FNP  57/M w/ history of cocaine abuse, hypertension, CAD, CVA, T2DM and bipolar disorder who presented with lower extremity edema. Noncompliant w medical therapy or outpatient follow up. IN ED, Hypertensive, positive lower extremity edema. bun 52 cr 1,85 , BNP >4,500, troponin 91 and 78  Toxicology screen positive for cocaine   CXR wcardiomegaly with bilateral hilar vascular congestion, bilateral central interstitial infiltrates with cephalization in the vasculature and fluid in the right fissure.  -Echo with new CHF, EF 30%, +wall motion abnormality, moderate to severe MR -Improving with diuresis, Unna boots  Subjective: -Feels fair overall, wants to stay as long as needed, swelling is improving  Assessment and Plan:  Acute on chronic systolic CHF  -Echo with reduced LV systolic function 30%, LV with moderate to severe reduction in systolic function, diffuse hypokinesis, akinesis of the basal to mid infero lateral and antero lateral wall. RV systolic function low normal, Moderate to severe MR,  - 29.5 L negative, weight inaccurate, remains volume overloaded, continue Lasix 80 Mg BID 1 more day, transition to torsemide tomorrow, continue, Jardiance, Aldactone, carvedilol> dose increased -Cards following, recommended ischemic eval as outpatient -Increase activity, BMP in a.m. -TOC clinic follow-up changed to next week  Hypoalbuminemia -likely combination of malnutrition and chronic liver disease  CAD (coronary artery disease) Continue aspirin and statin  High sensitive troponin elevation due to heart failure, ruled out acute coronary syndrome.   Hypertension Meds as above  AKI (acute kidney injury) (HCC) -Likely cardiorenal, improving with diuresis  DM II (diabetes mellitus, type II), controlled (HCC) Continue Jardiance, SSI, add Semglee, change diet to carb  mod  Anxiety Continue buspirone.   Cigarette smoker Nicotine patch counseling once patient is stable.  Cocaine abuse (HCC) Counseled  GERD (gastroesophageal reflux disease) Continue pantoprazole.   Protein-calorie malnutrition, severe Continue with nutritional supplements.   Elevated LFTs Transaminitis secondary to passive hepatic congestion from CHF exacerbation.  CVA (cerebral vascular accident) (HCC) - aspirin and statin   DVT prophylaxis: Hep SQ Code Status: Full Code Family Communication: None present Disposition Plan: Home in 1 to 2 days  Consultants:    Procedures:   Antimicrobials:    Objective: Vitals:   07/19/23 0300 07/19/23 0345 07/19/23 0548 07/19/23 0759  BP: (!) 126/93  120/87 104/64  Pulse: 75  66 64  Resp: 20     Temp: 97.8 F (36.6 C)   97.9 F (36.6 C)  TempSrc: Oral   Oral  SpO2: 97%   100%  Weight:  75.8 kg    Height:        Intake/Output Summary (Last 24 hours) at 07/19/2023 1135 Last data filed at 07/19/2023 1029 Gross per 24 hour  Intake 380 ml  Output 5765 ml  Net -5385 ml   Filed Weights   07/17/23 0525 07/18/23 0500 07/19/23 0345  Weight: 79.9 kg 77.3 kg 75.8 kg    Examination:  General exam: Chronically ill male sitting up in bed, AAOx3 HEENT: Positive JVD CVS: S1-S2, regular rhythm Lungs: Decreased breath sounds at the bases Abdomen: Soft, nontender, bowel sounds present Remedies: 1+ edema, Unna boots on Skin: No rashes Psychiatry:  Mood & affect appropriate.     Data Reviewed:   CBC: Recent Labs  Lab 07/15/23 0218  WBC 9.8  HGB 13.7  HCT 42.3  MCV 93.2  PLT 190   Basic Metabolic Panel: Recent Labs  Lab 07/13/23  8119 07/14/23 0214 07/15/23 0218 07/16/23 0753 07/17/23 0203 07/18/23 0211 07/19/23 0203  NA 138 135 136 135 135 134* 135  K 3.5 3.6 4.1 4.3 4.0 4.6 3.8  CL 104 104 104 98 98 97* 98  CO2 25 23 20* 26 24 27 25   GLUCOSE 134* 157* 111* 206* 107* 137* 194*  BUN 38* 39* 43* 49* 57* 59*  65*  CREATININE 1.39* 1.43* 1.56* 1.60* 1.59* 1.63* 1.66*  CALCIUM 8.4* 7.9* 8.1* 8.5* 8.9 9.1 8.7*  MG 2.0 2.0  --   --   --  2.3  --    GFR: Estimated Creatinine Clearance: 52.6 mL/min (A) (by C-G formula based on SCr of 1.66 mg/dL (H)). Liver Function Tests: Recent Labs  Lab 07/15/23 0218  AST 31  ALT 28  ALKPHOS 84  BILITOT 1.1  PROT 5.5*  ALBUMIN 2.5*   No results for input(s): "LIPASE", "AMYLASE" in the last 168 hours. No results for input(s): "AMMONIA" in the last 168 hours. Coagulation Profile: Recent Labs  Lab 07/15/23 0218  INR 1.0   Cardiac Enzymes: No results for input(s): "CKTOTAL", "CKMB", "CKMBINDEX", "TROPONINI" in the last 168 hours. BNP (last 3 results) Recent Labs    05/28/23 0850  PROBNP 24,067*   HbA1C: No results for input(s): "HGBA1C" in the last 72 hours. CBG: Recent Labs  Lab 07/18/23 0831 07/18/23 1141 07/18/23 1618 07/18/23 2125 07/19/23 0546  GLUCAP 193* 183* 133* 220* 198*   Lipid Profile: No results for input(s): "CHOL", "HDL", "LDLCALC", "TRIG", "CHOLHDL", "LDLDIRECT" in the last 72 hours.  Thyroid Function Tests: No results for input(s): "TSH", "T4TOTAL", "FREET4", "T3FREE", "THYROIDAB" in the last 72 hours.  Anemia Panel: No results for input(s): "VITAMINB12", "FOLATE", "FERRITIN", "TIBC", "IRON", "RETICCTPCT" in the last 72 hours. Urine analysis:    Component Value Date/Time   COLORURINE YELLOW 07/10/2023 1555   APPEARANCEUR CLEAR 07/10/2023 1555   LABSPEC 1.014 07/10/2023 1555   PHURINE 5.0 07/10/2023 1555   GLUCOSEU NEGATIVE 07/10/2023 1555   HGBUR SMALL (A) 07/10/2023 1555   BILIRUBINUR NEGATIVE 07/10/2023 1555   KETONESUR NEGATIVE 07/10/2023 1555   PROTEINUR 100 (A) 07/10/2023 1555   UROBILINOGEN 1.0 03/02/2010 2212   NITRITE NEGATIVE 07/10/2023 1555   LEUKOCYTESUR NEGATIVE 07/10/2023 1555   Sepsis Labs: @LABRCNTIP (procalcitonin:4,lacticidven:4)  ) Recent Results (from the past 240 hours)  Resp panel by  RT-PCR (RSV, Flu A&B, Covid) Anterior Nasal Swab     Status: None   Collection Time: 07/10/23  3:49 PM   Specimen: Anterior Nasal Swab  Result Value Ref Range Status   SARS Coronavirus 2 by RT PCR NEGATIVE NEGATIVE Final   Influenza A by PCR NEGATIVE NEGATIVE Final   Influenza B by PCR NEGATIVE NEGATIVE Final    Comment: (NOTE) The Xpert Xpress SARS-CoV-2/FLU/RSV plus assay is intended as an aid in the diagnosis of influenza from Nasopharyngeal swab specimens and should not be used as a sole basis for treatment. Nasal washings and aspirates are unacceptable for Xpert Xpress SARS-CoV-2/FLU/RSV testing.  Fact Sheet for Patients: BloggerCourse.com  Fact Sheet for Healthcare Providers: SeriousBroker.it  This test is not yet approved or cleared by the [place] FDA and has been authorized for detection and/or diagnosis of SARS-CoV-2 by FDA under an Emergency Use Authorization (EUA). This EUA will remain in effect (meaning this test can be used) for the duration of the COVID-19 declaration under Section 564(b)(1) of the Act, 21 U.S.C. section 360bbb-3(b)(1), unless the authorization is terminated or revoked.  Resp Syncytial Virus by PCR NEGATIVE NEGATIVE Final    Comment: (NOTE) Fact Sheet for Patients: BloggerCourse.com  Fact Sheet for Healthcare Providers: SeriousBroker.it  This test is not yet approved or cleared by the [place] FDA and has been authorized for detection and/or diagnosis of SARS-CoV-2 by FDA under an Emergency Use Authorization (EUA). This EUA will remain in effect (meaning this test can be used) for the duration of the COVID-19 declaration under Section 564(b)(1) of the Act, 21 U.S.C. section 360bbb-3(b)(1), unless the authorization is terminated or revoked.  Performed at [facility], [address] [place], [place] [address]   MRSA Next  Gen by PCR, Nasal     Status: None   Collection Time: 07/11/23  5:45 PM   Specimen: Nasal Mucosa; Nasal Swab  Result Value Ref Range Status   MRSA by PCR Next Gen NOT DETECTED NOT DETECTED Final    Comment: (NOTE) The GeneXpert MRSA Assay (FDA approved for NASAL specimens only), is one component of a comprehensive MRSA colonization surveillance program. It is not intended to diagnose MRSA infection nor to guide or monitor treatment for MRSA infections. Test performance is not FDA approved in patients less than 43 years old. Performed at [facility], [address] [place], [place] [address]      Radiology Studies: No results found.    Scheduled Meds:  aspirin  81 mg Oral Daily   carvedilol  25 mg Oral BID WC   empagliflozin  10 mg Oral Daily   feeding supplement  1 Container Oral TID BM   finasteride  5 mg Oral Daily   furosemide  80 mg Intravenous BID   heparin  5,000 Units Subcutaneous Q8H   insulin aspart  0-15 Units Subcutaneous TID WC   insulin glargine-yfgn  10 Units Subcutaneous Daily   nicotine  14 mg Transdermal Daily   pantoprazole  40 mg Oral QHS   rosuvastatin  40 mg Oral Daily   sodium chloride flush  3 mL Intravenous Q12H   spironolactone  12.5 mg Oral Daily   tamsulosin  0.8 mg Oral QHS   Continuous Infusions:   LOS: 9 days    Time spent:    Zannie Cove, MD [facility]   07/19/2023, 11:35 AM

## 2023-07-19 NOTE — Progress Notes (Signed)
Heart Failure Nurse Navigator Progress Note    Patient original HF TOC follow appointment was rescheduled from 07/23/23 to 07/28/2023 @ 11 am due to patient still hospitalized. Patient was updated of appointment change and a new appointment card was given to patient at bedside.   Rhae Hammock, BSN, Scientist, clinical (histocompatibility and immunogenetics) Only

## 2023-07-19 NOTE — Progress Notes (Signed)
   Heart Failure Stewardship Pharmacist Progress Note   PCP: Donell Beers, FNP PCP-Cardiologist: None    HPI:  58 yo M with PMH of CHF, HTN, CVA, hepatitis C, CAD, bipolar disorder, history of cocaine use, and medical noncompliance.   Presented to the ED on 1/25 with shortness of breath, LEE, and scrotal edema for 2 months. BNP >4500, trop 91>78. UDS positive for cocaine on admission. CXR with low lung volumes, pulmonary edema, and cardiomegaly. ECHO 1/27 showed LVEF 30%, RWMA with diffuse hypokinesis, mild concentric LVH, RV low normal, moderately elevated PA pressure, moderate to severe MR.    Reports improvement in breathing. Denies shortness of breath. LE edema improving. Has been up walking with PT and reports that he is able to walk further and denies shortness of breath on exertion. Still has UNNA boots placed. Asking about needing cardiac cath before discharge - Dr. Rennis Golden aware.   Current HF Medications: Diuretic: furosemide 80 mg IV BID Beta Blocker: carvedilol 25 mg BID MRA: spironolactone 12.5 mg daily SGLT2i: Jardiance 10 mg daily  Prior to admission HF Medications: Diuretic: furosemide 40 mg daily *also prescribed amlodipine 10 mg daily, hydrochlorothiazide 25 mg daily, and clonidine 0.2 mg BID - although noncompliant  Pertinent Lab Values: Serum creatinine 1.66, BUN 65, Potassium 3.8, Sodium 135, BNP >4500, Magnesium 2.3, A1c 6.9   Vital Signs: Weight: 167 lbs (admission weight: 175 lbs) Blood pressure: 100-120/80s  Heart rate: 60-70s  I/O: net -4.8L yesterday; net -29.5L since admission  Medication Assistance / Insurance Benefits Check: Does the patient have prescription insurance?  Yes Type of insurance plan: [place] Medicaid  Outpatient Pharmacy:  Prior to admission [facility] Is the patient willing to use [facility] TOC pharmacy at discharge? Yes Is the patient willing to transition their outpatient pharmacy to utilize a [facility] outpatient  pharmacy?   No    Assessment: 1. Acute on chronic systolic CHF (LVEF 30%). NYHA class III symptoms. - Continue furosemide 80 mg IV BID, close to transitioning to PO. Unna boots placed 1/28. Output increased with higher dose. Continue to monitor renal function, stable today. Strict I/Os and daily weights. Keep K>4 and Mg>2. - Continue carvedilol 25 mg BID - Consider adding Entresto vs ARB pending BP prior to discharge - Continue spironolactone 12.5 mg daily  - Continue Jardiance 10 mg daily - Now off hydralazine/nitrate with BP more controlled - Would not resume amlodipine, hydrochlorothiazide, or clonidine at discharge   Plan: 1) Medication changes recommended at this time: - Continue IV diuresis, may be able to transition to PO tomorrow  2) Patient assistance: - Farxiga/Jardiance copay $0  3)  Education  - Patient has been educated on current HF medications and potential additions to HF medication regimen - Patient verbalizes understanding that over the next few months, these medication doses may change and more medications may be added to optimize HF regimen - Patient has been educated on basic disease state pathophysiology and goals of therapy   Sharen Hones, PharmD, BCPS Heart Failure Stewardship Pharmacist Phone [phone]

## 2023-07-20 DIAGNOSIS — I4729 Other ventricular tachycardia: Secondary | ICD-10-CM | POA: Diagnosis not present

## 2023-07-20 DIAGNOSIS — N179 Acute kidney failure, unspecified: Secondary | ICD-10-CM | POA: Diagnosis not present

## 2023-07-20 DIAGNOSIS — I5023 Acute on chronic systolic (congestive) heart failure: Secondary | ICD-10-CM | POA: Diagnosis not present

## 2023-07-20 LAB — GLUCOSE, CAPILLARY
Glucose-Capillary: 146 mg/dL — ABNORMAL HIGH (ref 70–99)
Glucose-Capillary: 189 mg/dL — ABNORMAL HIGH (ref 70–99)
Glucose-Capillary: 186 mg/dL — ABNORMAL HIGH (ref 70–99)
Glucose-Capillary: 198 mg/dL — ABNORMAL HIGH (ref 70–99)

## 2023-07-20 LAB — BASIC METABOLIC PANEL
Anion gap: 11 (ref 5–15)
BUN: 67 mg/dL — ABNORMAL HIGH (ref 6–20)
CO2: 27 mmol/L (ref 22–32)
Calcium: 9.1 mg/dL (ref 8.9–10.3)
Chloride: 100 mmol/L (ref 98–111)
Creatinine, Ser: 1.57 mg/dL — ABNORMAL HIGH (ref 0.61–1.24)
GFR, Estimated: 51 mL/min — ABNORMAL LOW (ref >60)
Glucose, Bld: 117 mg/dL — ABNORMAL HIGH (ref 70–99)
Potassium: 4.2 mmol/L (ref 3.5–5.1)
Sodium: 138 mmol/L (ref 135–145)

## 2023-07-20 MED ORDER — CARVEDILOL 25 MG PO TABS
37.5000 mg | ORAL_TABLET | Freq: Two times a day (BID) | ORAL | Status: DC
  Administered 2023-07-20 – 2023-07-22 (×4): 37.5 mg via ORAL
  Filled 2023-07-20 (×4): qty 1

## 2023-07-20 MED ORDER — BUMETANIDE 2 MG PO TABS
4.0000 mg | ORAL_TABLET | Freq: Every day | ORAL | Status: DC
  Administered 2023-07-20: 4 mg via ORAL
  Filled 2023-07-20 (×2): qty 2

## 2023-07-20 MED ORDER — TAMSULOSIN HCL 0.4 MG PO CAPS
0.4000 mg | ORAL_CAPSULE | Freq: Every day | ORAL | Status: DC
  Administered 2023-07-20 – 2023-07-21 (×2): 0.4 mg via ORAL
  Filled 2023-07-20 (×2): qty 1

## 2023-07-20 NOTE — Plan of Care (Signed)

## 2023-07-20 NOTE — Progress Notes (Addendum)
 Rounding Note   Patient Name: KI CORBO Date of Encounter: 07/20/2023  [facility] Cardiologist: Dr Edwyna  Subjective   Diuresed another 2L overnight - now 31L negative.  Creatinine further improved to 1.57. 2 runs of NSVT again.  Inpatient Medications    Scheduled Meds:  aspirin   81 mg Oral Daily   carvedilol   25 mg Oral BID WC   empagliflozin   10 mg Oral Daily   finasteride   5 mg Oral Daily   furosemide   80 mg Intravenous BID   heparin   5,000 Units Subcutaneous Q8H   insulin  aspart  0-15 Units Subcutaneous TID WC   insulin  glargine-yfgn  10 Units Subcutaneous Daily   nicotine   14 mg Transdermal Daily   pantoprazole   40 mg Oral QHS   rosuvastatin   40 mg Oral Daily   sodium chloride  flush  3 mL Intravenous Q12H   spironolactone   12.5 mg Oral Daily   tamsulosin   0.8 mg Oral QHS   Continuous Infusions:  PRN Meds: acetaminophen  **OR** acetaminophen , albuterol , hydrALAZINE , nitroGLYCERIN , ondansetron  **OR** ondansetron  (ZOFRAN ) IV   Vital Signs    Vitals:   07/19/23 1810 07/19/23 1920 07/19/23 2332 07/20/23 0333  BP: 105/66 125/85 116/75 109/72  Pulse:  70 66 65  Resp:  18 16 18   Temp:  98.1 F (36.7 C) 97.6 F (36.4 C) 97.6 F (36.4 C)  TempSrc:  Oral Oral Oral  SpO2:  97% 95% 96%  Weight:    75.5 kg  Height:        Intake/Output Summary (Last 24 hours) at 07/20/2023 0834 Last data filed at 07/20/2023 0334 Gross per 24 hour  Intake 360 ml  Output 2550 ml  Net -2190 ml      07/20/2023    3:33 AM 07/19/2023    3:45 AM 07/18/2023    5:00 AM  Last 3 Weights  Weight (lbs) 166 lb 7.2 oz 167 lb 1.7 oz 170 lb 6.7 oz  Weight (kg) 75.5 kg 75.8 kg 77.3 kg      Telemetry    Sinus rhythm with occasional NSVT- Personally Reviewed  Physical Exam   General appearance: alert and no distress Lungs: clear to auscultation bilaterally Heart: regular rate and rhythm Extremities: edema 1+ bilateral LE, wrapped legs Neurologic: Grossly normal  Labs     High Sensitivity Troponin:   Recent Labs  Lab 07/10/23 1549 07/10/23 1752  TROPONINIHS 91* 78*     Chemistry Recent Labs  Lab 07/14/23 0214 07/15/23 0218 07/16/23 0753 07/18/23 0211 07/19/23 0203 07/20/23 0218  NA 135 136   < > 134* 135 138  K 3.6 4.1   < > 4.6 3.8 4.2  CL 104 104   < > 97* 98 100  CO2 23 20*   < > 27 25 27   GLUCOSE 157* 111*   < > 137* 194* 117*  BUN 39* 43*   < > 59* 65* 67*  CREATININE 1.43* 1.56*   < > 1.63* 1.66* 1.57*  CALCIUM  7.9* 8.1*   < > 9.1 8.7* 9.1  MG 2.0  --   --  2.3  --   --   PROT  --  5.5*  --   --   --   --   ALBUMIN  --  2.5*  --   --   --   --   AST  --  31  --   --   --   --   ALT  --  28  --   --   --   --   ALKPHOS  --  84  --   --   --   --   BILITOT  --  1.1  --   --   --   --   GFRNONAA 57* 51*   < > 49* 48* 51*  ANIONGAP 8 12   < > 10 12 11    < > = values in this interval not displayed.    Lipids  No results for input(s): CHOL, TRIG, HDL, LABVLDL, LDLCALC, CHOLHDL in the last 168 hours.   Hematology Recent Labs  Lab 07/15/23 0218  WBC 9.8  RBC 4.54  HGB 13.7  HCT 42.3  MCV 93.2  MCH 30.2  MCHC 32.4  RDW 16.5*  PLT 190   Thyroid  No results for input(s): TSH, FREET4 in the last 168 hours.   BNP No results for input(s): BNP, PROBNP in the last 168 hours.     Radiology    No results found.   Patient Profile     58 y.o. male with PMH of CAD, bipolar, CVA, substance abuse, hypertension, noncompliance for evaluation of acute on chronic diastolic CHF. Seen recently in the office and echo ordered but not performed.  Assessment & Plan    1 Ischemic Acute on chronic systolic CHF-pt has been noncompliant with meds. Significantly volume overloaded.  -Echo showed EF 30% with basal to mid inferior/anterior lateral akinesis -Diuresed around 27L total. Creatinine 1.8-1.39->1.43 -> now stable around 1.5-1.6 -Will switch to oral Bumex  4 mg daily today- monitor output. -On jardiance , aldactone ,  carvedilol  -Would arrange for outpatient ischemia evaluation given history of CAD, prior MI (Reported stent) and depressed LVEF  2 hypertension-  In the setting of cocaine withdrawal Better controlled, but still some diastolic hypertension -Increase coreg  to 25 mg twice daily, continue aldactone  12.5 mg daily  3 acute on chronic stage 3a kidney disease-follow renal function with diuresis.  Complicated with recent cocaine use  4 cocaine abuse-needs to avoid.   5 elevated troponin-minimal; no plans for ischemia eval at this point. He would need to demonstrate compliance with meds and FU prior to procedures.  6 H/O CAD-continue ASA 81 mg daily and statin.  7 NSVT -K>4, Mg>2 -He had more NSVT overnight - add magnesium to labs, potassium ok -Echo suggests prior infarct, the is likely the source. -Could consider LifeVest or AICD, however, non-compliance appears to be a major deterrent to this - Will increase carvedilol  to 37.5 mg BID, monitor BP as this may be a limiting step - Will decrease tamsulosin  to 0.4 mg at bedtime to allow BP Room - Would not consider him an AICD/LifeVest candidate d/t non-compliance and polysubstance abuse  8 Nocturnal hypoxia - recommend outpatient sleep study - he reports snoring and gasping for breath (however, in the setting of decompensated heart failure).  Time Spent Directly with Patient:  I have spent a total of 35 minutes with the patient reviewing hospital notes, telemetry, EKGs, labs and examining the patient as well as establishing an assessment and plan that was discussed personally with the patient.     For questions or updates, please contact [facility] Please consult [email or web address] for contact info under   Vinie KYM Maxcy, MD, CODY CALANDRA  [place]  [facility]  Medical Director of the [facility]  Card[facility] Diplomate of the American Board of Clinical Lipidology Attending  Cardiologist  Direct Dial:  [phone]  Fax: [phone]  Website:  [email or web address]  Vinie JAYSON Maxcy, MD  07/20/2023, 8:34 AM

## 2023-07-20 NOTE — Progress Notes (Signed)
 PROGRESS NOTE    [name]  FMW:[id number] DOB: [birth year] DOA: 07/10/2023 PCP: Paseda, Folashade R, FNP  57/M w/ history of cocaine abuse, hypertension, CAD, CVA, T2DM and bipolar disorder who presented with lower extremity edema. Noncompliant w medical therapy or outpatient follow up. IN ED, Hypertensive, positive lower extremity edema. bun 52 cr 1,85 , BNP >4,500, troponin 91 and 78  Toxicology screen positive for cocaine   CXR wcardiomegaly with bilateral hilar vascular congestion, bilateral central interstitial infiltrates with cephalization in the vasculature and fluid in the right fissure.  -Echo with new CHF, EF 30%, +wall motion abnormality, moderate to severe MR -Improving with diuresis, Unna boots  Subjective: -Feels better overall, swelling continues to improve, fixated on food  Assessment and Plan:  Acute on chronic systolic CHF  -Echo with reduced LV systolic function 30%, LV with moderate to severe reduction in systolic function, diffuse hypokinesis, akinesis of the basal to mid infero lateral and antero lateral wall. RV systolic function low normal, Moderate to severe MR,  -31.6 L, volume status significantly improved, transition to oral Bumex  today, continue, Jardiance , Aldactone , carvedilol > dose increased -Cards following, recommended ischemic eval as outpatient -Discharge planning, home tomorrow if stable -TOC clinic follow-up changed to next week  Hypoalbuminemia -likely combination of malnutrition and chronic liver disease  CAD (coronary artery disease) Continue aspirin  and statin  High sensitive troponin elevation due to heart failure, ruled out acute coronary syndrome.   Hypertension Meds as above  AKI (acute kidney injury) (HCC) -Likely cardiorenal, improving with diuresis  DM II (diabetes mellitus, type II), controlled (HCC) Continue Jardiance , SSI, A1c was 6.9 in December, -Now on low-dose Semglee , will discontinue at  discharge  Anxiety Continue buspirone.   Cigarette smoker Nicotine  patch counseling once patient is stable.  Cocaine abuse (HCC) Counseled  GERD (gastroesophageal reflux disease) Continue pantoprazole .   Protein-calorie malnutrition, severe Continue with nutritional supplements.   Elevated LFTs Transaminitis secondary to passive hepatic congestion from CHF exacerbation.  CVA (cerebral vascular accident) (HCC) - aspirin  and statin   DVT prophylaxis: Hep SQ Code Status: Full Code Family Communication: None present Disposition Plan: Home likely tomorrow  Consultants:    Procedures:   Antimicrobials:    Objective: Vitals:   07/19/23 1920 07/19/23 2332 07/20/23 0333 07/20/23 0843  BP: 125/85 116/75 109/72 102/68  Pulse: 70 66 65 62  Resp: 18 16 18    Temp: 98.1 F (36.7 C) 97.6 F (36.4 C) 97.6 F (36.4 C) 97.6 F (36.4 C)  TempSrc: Oral Oral Oral Oral  SpO2: 97% 95% 96%   Weight:   75.5 kg   Height:        Intake/Output Summary (Last 24 hours) at 07/20/2023 0948 Last data filed at 07/20/2023 9162 Gross per 24 hour  Intake 600 ml  Output 2550 ml  Net -1950 ml   Filed Weights   07/18/23 0500 07/19/23 0345 07/20/23 0333  Weight: 77.3 kg 75.8 kg 75.5 kg    Examination:  General exam: Chronically ill male sitting up in bed, AAOx3 HEENT: noJVD CVS: S1-S2, regular rhythm Lungs: Decreased breath sounds at the bases Abdomen: Soft, nontender, bowel sounds present Remedies: 1+ edema, Unna boots on Skin: No rashes Psychiatry:  Mood & affect appropriate.     Data Reviewed:   CBC: Recent Labs  Lab 07/15/23 0218  WBC 9.8  HGB 13.7  HCT 42.3  MCV 93.2  PLT 190   Basic Metabolic Panel: Recent Labs  Lab 07/14/23 0214 07/15/23 0218 07/16/23 0753  07/17/23 0203 07/18/23 0211 07/19/23 0203 07/20/23 0218  NA 135   < > 135 135 134* 135 138  K 3.6   < > 4.3 4.0 4.6 3.8 4.2  CL 104   < > 98 98 97* 98 100  CO2 23   < > 26 24 27 25 27   GLUCOSE 157*    < > 206* 107* 137* 194* 117*  BUN 39*   < > 49* 57* 59* 65* 67*  CREATININE 1.43*   < > 1.60* 1.59* 1.63* 1.66* 1.57*  CALCIUM  7.9*   < > 8.5* 8.9 9.1 8.7* 9.1  MG 2.0  --   --   --  2.3  --   --    < > = values in this interval not displayed.   GFR: Estimated Creatinine Clearance: 55.4 mL/min (A) (by C-G formula based on SCr of 1.57 mg/dL (H)). Liver Function Tests: Recent Labs  Lab 07/15/23 0218  AST 31  ALT 28  ALKPHOS 84  BILITOT 1.1  PROT 5.5*  ALBUMIN 2.5*   No results for input(s): LIPASE, AMYLASE in the last 168 hours. No results for input(s): AMMONIA in the last 168 hours. Coagulation Profile: Recent Labs  Lab 07/15/23 0218  INR 1.0   Cardiac Enzymes: No results for input(s): CKTOTAL, CKMB, CKMBINDEX, TROPONINI in the last 168 hours. BNP (last 3 results) Recent Labs    05/28/23 0850  PROBNP 24,067*   HbA1C: No results for input(s): HGBA1C in the last 72 hours. CBG: Recent Labs  Lab 07/19/23 0546 07/19/23 1145 07/19/23 1603 07/19/23 2112 07/20/23 0629  GLUCAP 198* 256* 130* 133* 146*   Lipid Profile: No results for input(s): CHOL, HDL, LDLCALC, TRIG, CHOLHDL, LDLDIRECT in the last 72 hours.  Thyroid Function Tests: No results for input(s): TSH, T4TOTAL, FREET4, T3FREE, THYROIDAB in the last 72 hours.  Anemia Panel: No results for input(s): VITAMINB12, FOLATE, FERRITIN, TIBC, IRON, RETICCTPCT in the last 72 hours. Urine analysis:    Component Value Date/Time   COLORURINE YELLOW 07/10/2023 1555   APPEARANCEUR CLEAR 07/10/2023 1555   LABSPEC 1.014 07/10/2023 1555   PHURINE 5.0 07/10/2023 1555   GLUCOSEU NEGATIVE 07/10/2023 1555   HGBUR SMALL (A) 07/10/2023 1555   BILIRUBINUR NEGATIVE 07/10/2023 1555   KETONESUR NEGATIVE 07/10/2023 1555   PROTEINUR 100 (A) 07/10/2023 1555   UROBILINOGEN 1.0 03/02/2010 2212   NITRITE NEGATIVE 07/10/2023 1555   LEUKOCYTESUR NEGATIVE 07/10/2023 1555   Sepsis  Labs: @LABRCNTIP (procalcitonin:4,lacticidven:4)  ) Recent Results (from the past 240 hours)  Resp panel by RT-PCR (RSV, Flu A&B, Covid) Anterior Nasal Swab     Status: None   Collection Time: 07/10/23  3:49 PM   Specimen: Anterior Nasal Swab  Result Value Ref Range Status   SARS Coronavirus 2 by RT PCR NEGATIVE NEGATIVE Final   Influenza A by PCR NEGATIVE NEGATIVE Final   Influenza B by PCR NEGATIVE NEGATIVE Final    Comment: (NOTE) The Xpert Xpress SARS-CoV-2/FLU/RSV plus assay is intended as an aid in the diagnosis of influenza from Nasopharyngeal swab specimens and should not be used as a sole basis for treatment. Nasal washings and aspirates are unacceptable for Xpert Xpress SARS-CoV-2/FLU/RSV testing.  Fact Sheet for Patients: bloggercourse.com  Fact Sheet for Healthcare Providers: seriousbroker.it  This test is not yet approved or cleared by the [place]  FDA and has been authorized for detection and/or diagnosis of SARS-CoV-2 by FDA under an Emergency Use Authorization (EUA). This EUA will remain in effect (meaning this  test can be used) for the duration of the COVID-19 declaration under Section 564(b)(1) of the Act, 21 U.S.C. section 360bbb-3(b)(1), unless the authorization is terminated or revoked.     Resp Syncytial Virus by PCR NEGATIVE NEGATIVE Final    Comment: (NOTE) Fact Sheet for Patients: bloggercourse.com  Fact Sheet for Healthcare Providers: seriousbroker.it  This test is not yet approved or cleared by the [place]  FDA and has been authorized for detection and/or diagnosis of SARS-CoV-2 by FDA under an Emergency Use Authorization (EUA). This EUA will remain in effect (meaning this test can be used) for the duration of the COVID-19 declaration under Section 564(b)(1) of the Act, 21 U.S.C. section 360bbb-3(b)(1), unless the authorization is  terminated or revoked.  Performed at [facility], [address] [place], [place] [address]   MRSA Next Gen by PCR, Nasal     Status: None   Collection Time: 07/11/23  5:45 PM   Specimen: Nasal Mucosa; Nasal Swab  Result Value Ref Range Status   MRSA by PCR Next Gen NOT DETECTED NOT DETECTED Final    Comment: (NOTE) The GeneXpert MRSA Assay (FDA approved for NASAL specimens only), is one component of a comprehensive MRSA colonization surveillance program. It is not intended to diagnose MRSA infection nor to guide or monitor treatment for MRSA infections. Test performance is not FDA approved in patients less than 3 years old. Performed at [facility], [address], [place], [place] [address]      Radiology Studies: No results found.    Scheduled Meds:  aspirin   81 mg Oral Daily   bumetanide   4 mg Oral Daily   carvedilol   37.5 mg Oral BID WC   empagliflozin   10 mg Oral Daily   finasteride   5 mg Oral Daily   heparin   5,000 Units Subcutaneous Q8H   insulin  aspart  0-15 Units Subcutaneous TID WC   insulin  glargine-yfgn  10 Units Subcutaneous Daily   nicotine   14 mg Transdermal Daily   pantoprazole   40 mg Oral QHS   rosuvastatin   40 mg Oral Daily   sodium chloride  flush  3 mL Intravenous Q12H   spironolactone   12.5 mg Oral Daily   tamsulosin   0.4 mg Oral QHS   Continuous Infusions:   LOS: 10 days    Time spent:    Sigurd Pac, MD [facility]   07/20/2023, 9:48 AM

## 2023-07-20 NOTE — Progress Notes (Signed)
   Heart Failure Stewardship Pharmacist Progress Note   PCP: Paseda, Folashade R, FNP PCP-Cardiologist: None    HPI:  58 yo M with PMH of CHF, HTN, CVA, hepatitis C, CAD, bipolar disorder, history of cocaine use, and medical noncompliance.   Presented to the ED on 1/25 with shortness of breath, LEE, and scrotal edema for 2 months. BNP >4500, trop 91>78. UDS positive for cocaine on admission. CXR with low lung volumes, pulmonary edema, and cardiomegaly. ECHO 1/27 showed LVEF 30%, RWMA with diffuse hypokinesis, mild concentric LVH, RV low normal, moderately elevated PA pressure, moderate to severe MR.    Reports improvement in breathing. Denies shortness of breath. LE edema improving. Legs have been wrapped in UNNA boots but he has them off now. Will be re-wrapped later today. Has not been up with PT yet.   Current HF Medications: Diuretic: bumetanide  4 mg daily Beta Blocker: carvedilol  37.5 mg BID MRA: spironolactone  12.5 mg daily SGLT2i: Jardiance  10 mg daily  Prior to admission HF Medications: Diuretic: furosemide  40 mg daily *also prescribed amlodipine  10 mg daily, hydrochlorothiazide 25 mg daily, and clonidine  0.2 mg BID - although noncompliant  Pertinent Lab Values: Serum creatinine 1.57, BUN 67, Potassium 4.2, Sodium 138, BNP >4500, Magnesium 2.3, A1c 6.9   Vital Signs: Weight: 166 lbs (admission weight: 175 lbs) Blood pressure: 100/70s  Heart rate: 60-70s  I/O: net -2.2L yesterday; net -31.5L since admission  Medication Assistance / Insurance Benefits Check: Does the patient have prescription insurance?  Yes Type of insurance plan: [place] Medicaid  Outpatient Pharmacy:  Prior to admission [facility] Is the patient willing to use [facility] TOC pharmacy at discharge? Yes Is the patient willing to transition their outpatient pharmacy to utilize a [facility] outpatient pharmacy?   No    Assessment: 1. Acute on chronic systolic CHF (LVEF 30%). NYHA class II  symptoms. - Agree with transitioning to bumetanide  4 mg daily. Strict I/Os and daily weights. Keep K>4 and Mg>2. - Agree with increasing carvedilol  to 37.5 mg BID with further runs of NSVT - Consider adding Entresto vs ARB pending BP and renal function prior to discharge - Continue spironolactone  12.5 mg daily  - Continue Jardiance  10 mg daily - Now off hydralazine /nitrate with BP more controlled - Would not resume amlodipine , hydrochlorothiazide, or clonidine  at discharge   Plan: 1) Medication changes recommended at this time: - Agree with changes  2) Patient assistance: - Farxiga/Jardiance  copay $0  3)  Education  - Patient has been educated on current HF medications and potential additions to HF medication regimen - Patient verbalizes understanding that over the next few months, these medication doses may change and more medications may be added to optimize HF regimen - Patient has been educated on basic disease state pathophysiology and goals of therapy   Duwaine Plant, PharmD, BCPS Heart Failure Stewardship Pharmacist Phone [phone]

## 2023-07-20 NOTE — Progress Notes (Addendum)
 This was erroneous placed in the chart for the wrong patient. Please disregard.  Vinie KYM Maxcy, MD, [facility], FACP  [place]  [facility]  Medical Director of the Advanced Lipid Disorders &  Cardiovascular Risk Reduction Clinic Diplomate of the American Board of Clinical Lipidology Attending Cardiologist  Direct Dial: [phone]  Fax: [phone]  Website:  [email or web address]

## 2023-07-21 DIAGNOSIS — F1410 Cocaine abuse, uncomplicated: Secondary | ICD-10-CM | POA: Diagnosis not present

## 2023-07-21 DIAGNOSIS — I5023 Acute on chronic systolic (congestive) heart failure: Secondary | ICD-10-CM | POA: Diagnosis not present

## 2023-07-21 DIAGNOSIS — I4729 Other ventricular tachycardia: Secondary | ICD-10-CM | POA: Diagnosis not present

## 2023-07-21 LAB — GLUCOSE, CAPILLARY
Glucose-Capillary: 151 mg/dL — ABNORMAL HIGH (ref 70–99)
Glucose-Capillary: 121 mg/dL — ABNORMAL HIGH (ref 70–99)
Glucose-Capillary: 129 mg/dL — ABNORMAL HIGH (ref 70–99)
Glucose-Capillary: 168 mg/dL — ABNORMAL HIGH (ref 70–99)

## 2023-07-21 LAB — BASIC METABOLIC PANEL
Anion gap: 12 (ref 5–15)
BUN: 76 mg/dL — ABNORMAL HIGH (ref 6–20)
CO2: 24 mmol/L (ref 22–32)
Calcium: 9 mg/dL (ref 8.9–10.3)
Chloride: 99 mmol/L (ref 98–111)
Creatinine, Ser: 1.78 mg/dL — ABNORMAL HIGH (ref 0.61–1.24)
GFR, Estimated: 44 mL/min — ABNORMAL LOW (ref >60)
Glucose, Bld: 161 mg/dL — ABNORMAL HIGH (ref 70–99)
Potassium: 4.1 mmol/L (ref 3.5–5.1)
Sodium: 135 mmol/L (ref 135–145)

## 2023-07-21 MED ORDER — BUMETANIDE 2 MG PO TABS
2.0000 mg | ORAL_TABLET | Freq: Every day | ORAL | Status: DC
  Administered 2023-07-21 – 2023-07-22 (×2): 2 mg via ORAL
  Filled 2023-07-21 (×2): qty 1

## 2023-07-21 NOTE — Progress Notes (Signed)
 Rounding Note   Patient Name: [name] Date of Encounter: 07/21/2023  [facility] Cardiologist: Dr Edwyna  Subjective   Net negative 1.8L  -  Creatinine up to 1.78. Appears to be nearing end-diuresis.  Inpatient Medications    Scheduled Meds:  aspirin   81 mg Oral Daily   bumetanide   4 mg Oral Daily   carvedilol   37.5 mg Oral BID WC   empagliflozin   10 mg Oral Daily   finasteride   5 mg Oral Daily   heparin   5,000 Units Subcutaneous Q8H   insulin  aspart  0-15 Units Subcutaneous TID WC   insulin  glargine-yfgn  10 Units Subcutaneous Daily   nicotine   14 mg Transdermal Daily   pantoprazole   40 mg Oral QHS   rosuvastatin   40 mg Oral Daily   sodium chloride  flush  3 mL Intravenous Q12H   spironolactone   12.5 mg Oral Daily   tamsulosin   0.4 mg Oral QHS   Continuous Infusions:  PRN Meds: acetaminophen  **OR** acetaminophen , albuterol , nitroGLYCERIN , ondansetron  **OR** ondansetron  (ZOFRAN ) IV   Vital Signs    Vitals:   07/20/23 1921 07/20/23 2336 07/21/23 0356 07/21/23 0812  BP: 105/72 117/75 (!) 119/96 103/68  Pulse: 60 71 77 (!) 59  Resp: 18 20 16 16   Temp: 97.6 F (36.4 C) 98.5 F (36.9 C) 98.2 F (36.8 C) 97.6 F (36.4 C)  TempSrc: Oral Oral Oral Oral  SpO2: 97% 96% 98% 98%  Weight:   75.7 kg   Height:        Intake/Output Summary (Last 24 hours) at 07/21/2023 0820 Last data filed at 07/21/2023 9180 Gross per 24 hour  Intake 1080 ml  Output 2650 ml  Net -1570 ml      07/21/2023    3:56 AM 07/20/2023    3:33 AM 07/19/2023    3:45 AM  Last 3 Weights  Weight (lbs) 166 lb 14.2 oz 166 lb 7.2 oz 167 lb 1.7 oz  Weight (kg) 75.7 kg 75.5 kg 75.8 kg      Telemetry    Sinus rhythm with occasional NSVT- Personally Reviewed  Physical Exam   General appearance: alert and no distress Lungs: clear to auscultation bilaterally Heart: regular rate and rhythm Extremities: edema 1+ bilateral LE, wrapped legs Neurologic: Grossly normal  Labs    High  Sensitivity Troponin:   Recent Labs  Lab 07/10/23 1549 07/10/23 1752  TROPONINIHS 91* 78*     Chemistry Recent Labs  Lab 07/15/23 0218 07/16/23 0753 07/18/23 0211 07/19/23 0203 07/20/23 0218 07/21/23 0211  NA 136   < > 134* 135 138 135  K 4.1   < > 4.6 3.8 4.2 4.1  CL 104   < > 97* 98 100 99  CO2 20*   < > 27 25 27 24   GLUCOSE 111*   < > 137* 194* 117* 161*  BUN 43*   < > 59* 65* 67* 76*  CREATININE 1.56*   < > 1.63* 1.66* 1.57* 1.78*  CALCIUM  8.1*   < > 9.1 8.7* 9.1 9.0  MG  --   --  2.3  --   --   --   PROT 5.5*  --   --   --   --   --   ALBUMIN 2.5*  --   --   --   --   --   AST 31  --   --   --   --   --   ALT  28  --   --   --   --   --   ALKPHOS 84  --   --   --   --   --   BILITOT 1.1  --   --   --   --   --   GFRNONAA 51*   < > 49* 48* 51* 44*  ANIONGAP 12   < > 10 12 11 12    < > = values in this interval not displayed.    Lipids  No results for input(s): CHOL, TRIG, HDL, LABVLDL, LDLCALC, CHOLHDL in the last 168 hours.   Hematology Recent Labs  Lab 07/15/23 0218  WBC 9.8  RBC 4.54  HGB 13.7  HCT 42.3  MCV 93.2  MCH 30.2  MCHC 32.4  RDW 16.5*  PLT 190   Thyroid  No results for input(s): TSH, FREET4 in the last 168 hours.   BNP No results for input(s): BNP, PROBNP in the last 168 hours.     Radiology    No results found.   Patient Profile     58 y.o. male with PMH of CAD, bipolar, CVA, substance abuse, hypertension, noncompliance for evaluation of acute on chronic diastolic CHF. Seen recently in the office and echo ordered but not performed.  Assessment & Plan    1 Ischemic Acute on chronic systolic CHF-pt has been noncompliant with meds. Significantly volume overloaded.  -Echo showed EF 30% with basal to mid inferior/anterior lateral akinesis -Diuresed around 27L total. Creatinine 1.8-1.39->1.43 -> now stable around 1.5-1.6 -Good diuresis- bump in creatinine. Will reduce Bumex  to 2 mg daily. -On jardiance ,  aldactone , carvedilol  -Would arrange for outpatient ischemia evaluation given history of CAD, prior MI (Reported stent) and depressed LVEF  2 hypertension-  In the setting of cocaine withdrawal Better controlled, but still some diastolic hypertension -Continue Coreg  37.5 mg BID  3 acute on chronic stage 3a kidney disease-follow renal function with diuresis.  Complicated with recent cocaine use  4 cocaine abuse-needs to avoid.   5 elevated troponin-minimal; no plans for ischemia eval at this point. He would need to demonstrate compliance with meds and FU prior to procedures.  6 H/O CAD-continue ASA 81 mg daily and statin.  7 NSVT -K>4, Mg>2 -Short bursts of NSVT - not an LifeVest or AICD candidate at this time d/t non-compliance and polysubstance abuse - Continue coreg  37.5 mg BID  8 Nocturnal hypoxia - recommend outpatient sleep study - he reports snoring and gasping for breath (however, in the setting of decompensated heart failure).  [facility] will sign off.   Medication Recommendations:  as above Other recommendations (labs, testing, etc):  none Follow up as an outpatient:  Dr. Jory   Time Spent Directly with Patient:  I have spent a total of 35 minutes with the patient reviewing hospital notes, telemetry, EKGs, labs and examining the patient as well as establishing an assessment and plan that was discussed personally with the patient.     For questions or updates, please contact [facility] Please consult [email or web address] for contact info under   Vinie KYM Maxcy, MD, CODY CALANDRA  [place]  [facility]  Medical Director of the Advanced Lipid Disorders &  Cardiovascular Risk Reduction Clinic Diplomate of the American Board of Clinical Lipidology Attending Cardiologist  Direct Dial: [phone]  Fax: [phone]  Website:  [email or web address]  Vinie JAYSON Maxcy, MD  07/21/2023, 8:20 AM

## 2023-07-21 NOTE — Progress Notes (Signed)
 Mobility Specialist Progress Note;   07/21/23 0935  Mobility  Activity Ambulated with assistance in hallway  Level of Assistance Standby assist, set-up cues, supervision of patient - no hands on  Assistive Device Front wheel walker  Distance Ambulated (ft) 400 ft  Activity Response Tolerated well  Mobility Referral Yes  Mobility visit 1 Mobility  Mobility Specialist Start Time (ACUTE ONLY) 0935  Mobility Specialist Stop Time (ACUTE ONLY) 0943  Mobility Specialist Time Calculation (min) (ACUTE ONLY) 8 min   Pt agreeable to mobility. Required no physical assistance during ambulation, SV. VSS throughout and no c/o during session. PT returned back to EOB with all needs met.   Lauraine Erm Mobility Specialist Please contact via SecureChat or [employer] [phone]

## 2023-07-21 NOTE — Progress Notes (Signed)
 PROGRESS NOTE    [name]  FMW:[id number] DOB: 08/14/1965 DOA: 07/10/2023 PCP: Paseda, Folashade R, FNP  58/M w/ history of cocaine abuse, hypertension, CAD, CVA, T2DM and bipolar disorder who presented with lower extremity edema. Noncompliant w medical therapy or outpatient follow up. IN ED, Hypertensive, positive lower extremity edema. bun 52 cr 1,85 , BNP >4,500, troponin 91 and 78  Toxicology screen positive for cocaine   CXR wcardiomegaly with bilateral hilar vascular congestion, bilateral central interstitial infiltrates with cephalization in the vasculature and fluid in the right fissure.  -Echo with new CHF, EF 30%, +wall motion abnormality, moderate to severe MR -Improving with diuresis, Unna boots  Subjective: -Still complains of swelling, breathing has improved  Assessment and Plan:  Acute on chronic systolic CHF  -Echo with reduced LV systolic function 30%, LV with moderate to severe reduction in systolic function, diffuse hypokinesis, akinesis of the basal to mid infero lateral and antero lateral wall. RV systolic function low normal, Moderate to severe MR,  - 33.5 L, volume status significantly improved, transitioned to oral Bumex  yesterday, creatinine has trended up slightly, dose decreased today -Continue Jardiance , Aldactone , carvedilol , add TED hose -Cards following, recommended ischemic eval as outpatient -Discharge planning, hopeful for home tomorrow -TOC clinic follow-up changed to next week  Hypoalbuminemia -likely combination of malnutrition and chronic liver disease  CAD (coronary artery disease) Continue aspirin  and statin  High sensitive troponin elevation due to heart failure, ruled out acute coronary syndrome.   Hypertension Meds as above  AKI (acute kidney injury) (HCC) -Likely cardiorenal, improving with diuresis  DM II (diabetes mellitus, type II), controlled (HCC) Continue Jardiance , SSI, A1c was 6.9 in December, -Now on low-dose Semglee ,  will discontinue at discharge  Anxiety Continue buspirone.   Cigarette smoker Nicotine  patch counseling once patient is stable.  Cocaine abuse (HCC) Counseled  GERD (gastroesophageal reflux disease) Continue pantoprazole .   Protein-calorie malnutrition, severe Continue with nutritional supplements.   Elevated LFTs Transaminitis secondary to passive hepatic congestion from CHF exacerbation.  CVA (cerebral vascular accident) (HCC) - aspirin  and statin   DVT prophylaxis: Hep SQ Code Status: Full Code Family Communication: None present Disposition Plan: Home likely tomorrow  Consultants:    Procedures:   Antimicrobials:    Objective: Vitals:   07/20/23 1921 07/20/23 2336 07/21/23 0356 07/21/23 0812  BP: 105/72 117/75 (!) 119/96 103/68  Pulse: 60 71 77 (!) 59  Resp: 18 20 16 16   Temp: 97.6 F (36.4 C) 98.5 F (36.9 C) 98.2 F (36.8 C) 97.6 F (36.4 C)  TempSrc: Oral Oral Oral Oral  SpO2: 97% 96% 98% 98%  Weight:   75.7 kg   Height:        Intake/Output Summary (Last 24 hours) at 07/21/2023 1057 Last data filed at 07/21/2023 9180 Gross per 24 hour  Intake 840 ml  Output 2650 ml  Net -1810 ml   Filed Weights   07/19/23 0345 07/20/23 0333 07/21/23 0356  Weight: 75.8 kg 75.5 kg 75.7 kg    Examination:  General exam: Chronically ill male sitting up in bed, AAOx3 HEENT: noJVD CVS: S1-S2, regular rhythm Lungs: Decreased breath sounds at the bases Abdomen: Soft, nontender, bowel sounds present Remedies: 1+ edema, Unna boots on Skin: No rashes Psychiatry:  Mood & affect appropriate.     Data Reviewed:   CBC: Recent Labs  Lab 07/15/23 0218  WBC 9.8  HGB 13.7  HCT 42.3  MCV 93.2  PLT 190   Basic Metabolic Panel: Recent  Labs  Lab 07/17/23 0203 07/18/23 0211 07/19/23 0203 07/20/23 0218 07/21/23 0211  NA 135 134* 135 138 135  K 4.0 4.6 3.8 4.2 4.1  CL 98 97* 98 100 99  CO2 24 27 25 27 24   GLUCOSE 107* 137* 194* 117* 161*  BUN 57* 59* 65*  67* 76*  CREATININE 1.59* 1.63* 1.66* 1.57* 1.78*  CALCIUM  8.9 9.1 8.7* 9.1 9.0  MG  --  2.3  --   --   --    GFR: Estimated Creatinine Clearance: 48.4 mL/min (A) (by C-G formula based on SCr of 1.78 mg/dL (H)). Liver Function Tests: Recent Labs  Lab 07/15/23 0218  AST 31  ALT 28  ALKPHOS 84  BILITOT 1.1  PROT 5.5*  ALBUMIN 2.5*   No results for input(s): LIPASE, AMYLASE in the last 168 hours. No results for input(s): AMMONIA in the last 168 hours. Coagulation Profile: Recent Labs  Lab 07/15/23 0218  INR 1.0   Cardiac Enzymes: No results for input(s): CKTOTAL, CKMB, CKMBINDEX, TROPONINI in the last 168 hours. BNP (last 3 results) Recent Labs    05/28/23 0850  PROBNP 24,067*   HbA1C: No results for input(s): HGBA1C in the last 72 hours. CBG: Recent Labs  Lab 07/20/23 0629 07/20/23 1059 07/20/23 1633 07/20/23 2125 07/21/23 0618  GLUCAP 146* 189* 186* 198* 151*   Lipid Profile: No results for input(s): CHOL, HDL, LDLCALC, TRIG, CHOLHDL, LDLDIRECT in the last 72 hours.  Thyroid Function Tests: No results for input(s): TSH, T4TOTAL, FREET4, T3FREE, THYROIDAB in the last 72 hours.  Anemia Panel: No results for input(s): VITAMINB12, FOLATE, FERRITIN, TIBC, IRON, RETICCTPCT in the last 72 hours. Urine analysis:    Component Value Date/Time   COLORURINE YELLOW 07/10/2023 1555   APPEARANCEUR CLEAR 07/10/2023 1555   LABSPEC 1.014 07/10/2023 1555   PHURINE 5.0 07/10/2023 1555   GLUCOSEU NEGATIVE 07/10/2023 1555   HGBUR SMALL (A) 07/10/2023 1555   BILIRUBINUR NEGATIVE 07/10/2023 1555   KETONESUR NEGATIVE 07/10/2023 1555   PROTEINUR 100 (A) 07/10/2023 1555   UROBILINOGEN 1.0 03/02/2010 2212   NITRITE NEGATIVE 07/10/2023 1555   LEUKOCYTESUR NEGATIVE 07/10/2023 1555   Sepsis Labs: @LABRCNTIP (procalcitonin:4,lacticidven:4)  ) Recent Results (from the past 240 hours)  MRSA Next Gen by PCR, Nasal     Status:  None   Collection Time: 07/11/23  5:45 PM   Specimen: Nasal Mucosa; Nasal Swab  Result Value Ref Range Status   MRSA by PCR Next Gen NOT DETECTED NOT DETECTED Final    Comment: (NOTE) The GeneXpert MRSA Assay (FDA approved for NASAL specimens only), is one component of a comprehensive MRSA colonization surveillance program. It is not intended to diagnose MRSA infection nor to guide or monitor treatment for MRSA infections. Test performance is not FDA approved in patients less than 50 years old. Performed at [facility], [address] [place], [place] [address]      Radiology Studies: No results found.    Scheduled Meds:  aspirin   81 mg Oral Daily   bumetanide   2 mg Oral Daily   carvedilol   37.5 mg Oral BID WC   empagliflozin   10 mg Oral Daily   finasteride   5 mg Oral Daily   heparin   5,000 Units Subcutaneous Q8H   insulin  aspart  0-15 Units Subcutaneous TID WC   insulin  glargine-yfgn  10 Units Subcutaneous Daily   nicotine   14 mg Transdermal Daily   pantoprazole   40 mg Oral QHS   rosuvastatin   40  mg Oral Daily   sodium chloride  flush  3 mL Intravenous Q12H   spironolactone   12.5 mg Oral Daily   tamsulosin   0.4 mg Oral QHS   Continuous Infusions:   LOS: 11 days    Time spent:    Sigurd Pac, MD [facility]   07/21/2023, 10:57 AM

## 2023-07-21 NOTE — Progress Notes (Signed)
   Heart Failure Stewardship Pharmacist Progress Note   PCP: Paseda, Folashade R, FNP PCP-Cardiologist: None    HPI:  58 yo M with PMH of CHF, HTN, CVA, hepatitis C, CAD, bipolar disorder, history of cocaine use, and medical noncompliance.   Presented to the ED on 1/25 with shortness of breath, LEE, and scrotal edema for 2 months. BNP >4500, trop 91>78. UDS positive for cocaine on admission. CXR with low lung volumes, pulmonary edema, and cardiomegaly. ECHO 1/27 showed LVEF 30%, RWMA with diffuse hypokinesis, mild concentric LVH, RV low normal, moderately elevated PA pressure, moderate to severe MR.    Denies shortness of breath. Legs still have edema but are still unwrapped. He states he will be getting compression stockings today vs re-wrap with UNNA boots. No new questions or concerns. [birth year].   Current HF Medications: Diuretic: bumetanide  2 mg daily Beta Blocker: carvedilol  37.5 mg BID MRA: spironolactone  12.5 mg daily SGLT2i: Jardiance  10 mg daily  Prior to admission HF Medications: Diuretic: furosemide  40 mg daily *also prescribed amlodipine  10 mg daily, hydrochlorothiazide 25 mg daily, and clonidine  0.2 mg BID - although noncompliant  Pertinent Lab Values: Serum creatinine 1.57>1.78, BUN 76, Potassium 4.1, Sodium 135, BNP >4500, Magnesium 2.3, A1c 6.9   Vital Signs: Weight: 166 lbs (admission weight: 175 lbs) Blood pressure: 100-110/70s  Heart rate: 60-70s  I/O: net -1.8L yesterday; net -33.3L since admission  Medication Assistance / Insurance Benefits Check: Does the patient have prescription insurance?  Yes Type of insurance plan: [place] Medicaid  Outpatient Pharmacy:  [facility] Is the patient willing to use [facility] TOC pharmacy at discharge? Yes Is the patient willing to transition their outpatient pharmacy to utilize a [facility] outpatient pharmacy?   No    Assessment: 1. Acute on chronic systolic CHF (LVEF 30%).  NYHA class II symptoms. - Agree with reducing to bumetanide  2 mg daily with bump in creatinine. Strict I/Os and daily weights. Keep K>4 and Mg>2. - Continue carvedilol  37.5 mg BID - Consider adding Entresto vs ARB pending BP and renal function prior to discharge - Continue spironolactone  12.5 mg daily  - Continue Jardiance  10 mg daily - Now off hydralazine /nitrate with BP more controlled - Would not resume amlodipine , hydrochlorothiazide, or clonidine  at discharge   Plan: 1) Medication changes recommended at this time: - Agree with changes  2) Patient assistance: - Farxiga/Jardiance  copay $0  3)  Education  - Patient has been educated on current HF medications and potential additions to HF medication regimen - Patient verbalizes understanding that over the next few months, these medication doses may change and more medications may be added to optimize HF regimen - Patient has been educated on basic disease state pathophysiology and goals of therapy   Duwaine Plant, PharmD, BCPS Heart Failure Stewardship Pharmacist Phone [phone]

## 2023-07-21 NOTE — Plan of Care (Signed)

## 2023-07-21 NOTE — TOC Progression Note (Signed)
 Transition of Care [facility]) - Progression Note    Patient Details  Name: ISAMU TRAMMEL MRN: [id number] Date of Birth: November 21, 1965  [facility] CM/SW Contact  Roxie KANDICE Stain, RN Phone Number: 07/21/2023, 12:18 PM  Clinical Narrative:    TOC continues to follow patient for discharge needs.   Expected Discharge Plan: Home/Self Care Barriers to Discharge: Continued Medical Work up  Expected Discharge Plan and Services In-house Referral: PCP / [facility] Discharge Planning Services: Follow-up appt scheduled   Living arrangements for the past 2 months: Single Family Home                                       Social Determinants of Health (SDOH) Interventions SDOH Screenings   Food Insecurity: No Food Insecurity (07/11/2023)  Housing: Unknown (07/12/2023)  Recent Concern: Housing - High Risk (07/11/2023)  Transportation Needs: No Transportation Needs (07/12/2023)  Utilities: Not At Risk (07/11/2023)  Alcohol Screen: Low Risk  (07/12/2023)  Financial Resource Strain: Low Risk  (07/12/2023)  Tobacco Use: High Risk (07/10/2023)    Readmission Risk Interventions     No data to display

## 2023-07-22 ENCOUNTER — Other Ambulatory Visit (HOSPITAL_COMMUNITY): Payer: Self-pay

## 2023-07-22 DIAGNOSIS — I5023 Acute on chronic systolic (congestive) heart failure: Secondary | ICD-10-CM | POA: Diagnosis not present

## 2023-07-22 LAB — BASIC METABOLIC PANEL
Anion gap: 11 (ref 5–15)
BUN: 67 mg/dL — ABNORMAL HIGH (ref 6–20)
CO2: 26 mmol/L (ref 22–32)
Calcium: 9.1 mg/dL (ref 8.9–10.3)
Chloride: 100 mmol/L (ref 98–111)
Creatinine, Ser: 1.85 mg/dL — ABNORMAL HIGH (ref 0.61–1.24)
GFR, Estimated: 42 mL/min — ABNORMAL LOW (ref >60)
Glucose, Bld: 136 mg/dL — ABNORMAL HIGH (ref 70–99)
Potassium: 4.4 mmol/L (ref 3.5–5.1)
Sodium: 137 mmol/L (ref 135–145)

## 2023-07-22 LAB — GLUCOSE, CAPILLARY: Glucose-Capillary: 169 mg/dL — ABNORMAL HIGH (ref 70–99)

## 2023-07-22 MED ORDER — ROSUVASTATIN CALCIUM 40 MG PO TABS
40.0000 mg | ORAL_TABLET | Freq: Every day | ORAL | 1 refills | Status: DC
  Filled 2023-07-22: qty 30, 30d supply, fill #0

## 2023-07-22 MED ORDER — BUMETANIDE 1 MG PO TABS
2.0000 mg | ORAL_TABLET | Freq: Every day | ORAL | 1 refills | Status: DC
  Filled 2023-07-22: qty 60, 30d supply, fill #0

## 2023-07-22 MED ORDER — SPIRONOLACTONE 25 MG PO TABS
12.5000 mg | ORAL_TABLET | Freq: Every day | ORAL | 1 refills | Status: DC
  Filled 2023-07-22: qty 15, 30d supply, fill #0

## 2023-07-22 MED ORDER — EMPAGLIFLOZIN 10 MG PO TABS
10.0000 mg | ORAL_TABLET | Freq: Every day | ORAL | 1 refills | Status: DC
  Filled 2023-07-22: qty 30, 30d supply, fill #0

## 2023-07-22 MED ORDER — CARVEDILOL 12.5 MG PO TABS
37.5000 mg | ORAL_TABLET | Freq: Two times a day (BID) | ORAL | 1 refills | Status: DC
  Filled 2023-07-22: qty 180, 30d supply, fill #0
  Filled 2023-07-22: qty 60, 10d supply, fill #0

## 2023-07-22 MED ORDER — BLOOD GLUCOSE METER KIT: PACK | 0 refills | Status: DC

## 2023-07-22 NOTE — Progress Notes (Signed)
   Heart Failure Stewardship Pharmacist Progress Note   PCP: Paseda, Folashade R, FNP PCP-Cardiologist: None    HPI:  58 yo M with PMH of CHF, HTN, CVA, hepatitis C, CAD, bipolar disorder, history of cocaine use, and medical noncompliance.   Presented to the ED on 1/25 with shortness of breath, LEE, and scrotal edema for 2 months. BNP >4500, trop 91>78. UDS positive for cocaine on admission. CXR with low lung volumes, pulmonary edema, and cardiomegaly. ECHO 1/27 showed LVEF 30%, RWMA with diffuse hypokinesis, mild concentric LVH, RV low normal, moderately elevated PA pressure, moderate to severe MR.    Denies shortness of breath. Still with some residual edema but stockings placed. No new questions or concerns. Planned discharge today.  Current HF Medications: Diuretic: bumetanide  2 mg daily Beta Blocker: carvedilol  37.5 mg BID MRA: spironolactone  12.5 mg daily SGLT2i: Jardiance  10 mg daily  Prior to admission HF Medications: Diuretic: furosemide  40 mg daily *also prescribed amlodipine  10 mg daily, hydrochlorothiazide 25 mg daily, and clonidine  0.2 mg BID - although noncompliant  Pertinent Lab Values: Serum creatinine 1.57>1.78>1.85, BUN 67, Potassium 4.4, Sodium 137, BNP >4500, Magnesium 2.3, A1c 6.9   Vital Signs: Weight: 167 lbs (admission weight: 175 lbs) Blood pressure: 100-110/70s  Heart rate: 60-70s  I/O: net -1.7L yesterday; net -34.9L since admission  Medication Assistance / Insurance Benefits Check: Does the patient have prescription insurance?  Yes Type of insurance plan: [place] Medicaid  Outpatient Pharmacy:  [facility] Is the patient willing to use [facility] TOC pharmacy at discharge? Yes Is the patient willing to transition their outpatient pharmacy to utilize a [facility] outpatient pharmacy?   No    Assessment: 1. Acute on chronic systolic CHF (LVEF 30%). NYHA class II symptoms. - Continue bumetanide  2 mg daily. Strict I/Os and  daily weights. Keep K>4 and Mg>2. - Continue carvedilol  37.5 mg BID - Consider adding Entresto vs ARB pending BP and renal function at follow up  - Continue spironolactone  12.5 mg daily  - Continue Jardiance  10 mg daily - Now off hydralazine /nitrate with BP more controlled - Would not resume amlodipine , hydrochlorothiazide, or clonidine  at discharge   Plan: 1) Medication changes recommended at this time: - Continue current regimen  2) Patient assistance: - Farxiga/Jardiance  copay $0  3)  Education  - Patient has been educated on current HF medications and potential additions to HF medication regimen - Patient verbalizes understanding that over the next few months, these medication doses may change and more medications may be added to optimize HF regimen - Patient has been educated on basic disease state pathophysiology and goals of therapy   Duwaine Plant, PharmD, BCPS Heart Failure Stewardship Pharmacist Phone [phone]

## 2023-07-22 NOTE — Discharge Summary (Signed)
 Physician Discharge Summary  [name] FMW:[id number] DOB: 11-Nov-1965 DOA: 07/10/2023  PCP: Paseda, Folashade R, FNP  Admit date: 07/10/2023 Discharge date: 07/22/2023  Time spent:45 minutes  Recommendations for Outpatient Follow-up:  CHF TOC clinic on 2/12 New PCP on 2/25 Consider outpatient ischemia evaluation   Discharge Diagnoses:  Principal Problem:   Acute on chronic systolic CHF (congestive heart failure) (HCC) Moderate to severe mitral regurgitation   CAD (coronary artery disease)   Hypertension   AKI (acute kidney injury) (HCC)   DM II (diabetes mellitus, type II), controlled (HCC)   Anxiety   Cigarette smoker   Cocaine abuse (HCC)   GERD (gastroesophageal reflux disease)   History of stroke   Protein-calorie malnutrition, severe   NSVT (nonsustained ventricular tachycardia) (HCC)   Discharge Condition: Improved  Diet recommendation: Low-sodium, heart healthy, diabetic  Filed Weights   07/20/23 0333 07/21/23 0356 07/22/23 0258  Weight: 75.5 kg 75.7 kg 76.2 kg    History of present illness:  57/M w/ history of cocaine abuse, hypertension, CAD, CVA, T2DM and bipolar disorder who presented with lower extremity edema. Noncompliant w medical therapy or outpatient follow up. IN ED, Hypertensive, positive lower extremity edema. bun 52 cr 1,85 , BNP >4,500, troponin 91 and 78  Toxicology screen positive for cocaine   CXR wcardiomegaly with bilateral hilar vascular congestion, bilateral central interstitial infiltrates with cephalization in the vasculature and fluid in the right fissure.  -Echo with new CHF, EF 30%, +wall motion abnormality, moderate to severe MR  Hospital Course:   Acute on chronic systolic CHF  Moderate to severe mitral regurgitation -Echo with reduced LV systolic function 30%, LV with moderate to severe reduction in systolic function, diffuse hypokinesis, akinesis of the basal to mid infero lateral and antero lateral wall. RV systolic function  low normal, Moderate to severe MR,  - 33.5 L, volume status significantly improved, transitioned to oral Bumex   -Continue Jardiance , Aldactone , carvedilol ,  TED hose -Cards following, recommended ischemic eval as outpatient -Discharge home today, meds sent to [facility], CHF TOC clinic follow-up next week 2/12   Hypoalbuminemia -likely combination of malnutrition and chronic liver disease   CAD (coronary artery disease) Continue aspirin  and statin  High sensitive troponin elevation due to heart failure, ruled out acute coronary syndrome.    Hypertension Meds as above   AKI (acute kidney injury) (HCC) -Likely cardiorenal, improving with diuresis   DM II (diabetes mellitus, type II), controlled (HCC) Continue Jardiance , SSI, A1c was 6.9 in December,   Anxiety Continue buspirone.    Cigarette smoker Nicotine  patch counseling once patient is stable.   Cocaine abuse (HCC) Counseled   GERD (gastroesophageal reflux disease) Continue pantoprazole .    Protein-calorie malnutrition, severe Continue with nutritional supplements.    Elevated LFTs Transaminitis secondary to passive hepatic congestion from CHF exacerbation.   CVA (cerebral vascular accident) (HCC) - aspirin  and statin     Discharge Exam: Vitals:   07/22/23 0258 07/22/23 0734  BP: 111/75 105/62  Pulse: 61 60  Resp: 18 16  Temp: 98 F (36.7 C) 97.6 F (36.4 C)  SpO2: 99% 98%    Gen: Awake, Alert, Oriented X 3,  HEENT: no JVD Lungs: Good air movement bilaterally, CTAB CVS: S1S2/RRR Abd: soft, Non tender, non distended, BS present Extremities: Trace edema, TED hose on Skin: no new rashes on exposed skin   Discharge Instructions   Discharge Instructions     Diet - low sodium heart healthy   Complete by: As directed  Diet Carb Modified   Complete by: As directed    Increase activity slowly   Complete by: As directed       Allergies as of 07/22/2023       Reactions   Ace Inhibitors Swelling    Penicillins Swelling   Throat and eye swelling Has patient had a PCN reaction causing immediate rash, facial/tongue/throat swelling, SOB or lightheadedness with hypotension: Yes Has patient had a PCN reaction causing severe rash involving mucus membranes or skin necrosis: No Has patient had a PCN reaction that required hospitalization: No Has patient had a PCN reaction occurring within the last 10 years: No If all of the above answers are NO, then may proceed with Cephalosporin use.        Medication List     STOP taking these medications    amLODipine  10 MG tablet Commonly known as: NORVASC    cloNIDine  0.2 MG tablet Commonly known as: CATAPRES    furosemide  40 MG tablet Commonly known as: LASIX    gabapentin 100 MG capsule Commonly known as: NEURONTIN   hydrochlorothiazide 25 MG tablet Commonly known as: HYDRODIURIL   meloxicam 7.5 MG tablet Commonly known as: MOBIC   traZODone 100 MG tablet Commonly known as: DESYREL       TAKE these medications    albuterol  108 (90 Base) MCG/ACT inhaler Commonly known as: VENTOLIN  HFA Inhale 2 puffs into the lungs every 4 (four) hours as needed for wheezing.   aspirin  EC 81 MG tablet Chew 81 mg by mouth daily.   blood glucose meter kit and supplies Dispense based on patient and insurance preference. Use up to four times daily as directed. (FOR ICD-10 E10.9, E11.9).   bumetanide  2 MG tablet Commonly known as: BUMEX  Take 1 tablet (2 mg total) by mouth daily. Start taking on: July 23, 2023   carvedilol  12.5 MG tablet Commonly known as: COREG  Take 3 tablets (37.5 mg total) by mouth 2 (two) times daily with a meal.   empagliflozin  10 MG Tabs tablet Commonly known as: JARDIANCE  Take 1 tablet (10 mg total) by mouth daily. Start taking on: July 23, 2023   finasteride  5 MG tablet Commonly known as: PROSCAR  Take 5 mg by mouth daily.   nitroGLYCERIN  0.4 MG SL tablet Commonly known as: NITROSTAT  Place 1 tablet (0.4  mg total) under the tongue every 5 (five) minutes as needed.   rosuvastatin  40 MG tablet Commonly known as: CRESTOR  Take 1 tablet (40 mg total) by mouth daily. Start taking on: July 23, 2023   spironolactone  25 MG tablet Commonly known as: ALDACTONE  Take 0.5 tablets (12.5 mg total) by mouth daily. Start taking on: July 23, 2023   tamsulosin  0.4 MG Caps capsule Commonly known as: FLOMAX  Take 0.4 mg by mouth daily.   tiZANidine 4 MG tablet Commonly known as: ZANAFLEX Take 4 mg by mouth 3 (three) times daily as needed for muscle spasms.       Allergies  Allergen Reactions   Ace Inhibitors Swelling   Penicillins Swelling    Throat and eye swelling Has patient had a PCN reaction causing immediate rash, facial/tongue/throat swelling, SOB or lightheadedness with hypotension: Yes Has patient had a PCN reaction causing severe rash involving mucus membranes or skin necrosis: No Has patient had a PCN reaction that required hospitalization: No Has patient had a PCN reaction occurring within the last 10 years: No If all of the above answers are NO, then may proceed with Cephalosporin use.    Follow-up Information  Paseda, Folashade R, FNP Follow up.   Specialty: Nurse Practitioner Why: TIME : 8:40 AM DATE :  FEB  25 ,2025 PLEASE BRING  INS CARDS, ID and MEDICATION Contact information: [address] [place], [place] [address] [phone]          [facility]. Go in 7 day(s).   Specialty: Cardiology Why: Hospital follow up 07/28/2023 @ 11 am PLEASE bring  current medication list to appointment FREE valet parking, Entrance C, off [employer] information: [address] [place]  [phone]                 The results of significant diagnostics from this hospitalization (including imaging, microbiology, ancillary and laboratory) are listed below for reference.     Significant Diagnostic Studies: ECHOCARDIOGRAM COMPLETE BUBBLE STUDY Result Date: 07/12/2023    ECHOCARDIOGRAM REPORT   Patient Name:   NISHANTH MCCAUGHAN Date of Exam: 07/12/2023 Medical Rec #:  [id number]        Height:       74.0 in Accession #:    [id number]       Weight:       175.5 lb Date of Birth:  Mar 30, 1966         BSA:          2.056 m Patient Age:    57 years         BP:           157/132 mmHg Patient Gender: M                HR:           82 bpm. Exam Location:  Inpatient Procedure: 2D Echo, Cardiac Doppler and Color Doppler Indications:    CHF  History:        Patient has no prior history of Echocardiogram examinations. CHF                 and Cardiomegaly, Cocaine abuse, Signs/Symptoms:Edema; Risk                 Factors:Hypertension, Diabetes, Dyslipidemia and Current Smoker.  Sonographer:    Tillman Nora RVT RCS Referring Phys: [phone] EKTA V PATEL  Sonographer Comments: Image acquisition challenging due to uncooperative patient. Patient unable to tolerate exam and refused to complete ECHO IMPRESSIONS  1. Left ventricular ejection fraction, by estimation, is 30%. The left ventricle has moderate to severely decreased function. The left ventricle demonstrates regional wall motion abnormalities with diffuse hypokinesis + basal to mid inferolateral and anterolateral akinesis. The left ventricular internal cavity size was moderately dilated. There is mild concentric left ventricular hypertrophy. Left ventricular diastolic parameters are indeterminate.  2. Right ventricular systolic function is low normal. The right ventricular size is moderately enlarged. There is moderately elevated pulmonary artery systolic pressure. The estimated right ventricular systolic pressure is 59.9 mmHg.  3. Left atrial size was severely dilated.  4. Right atrial size was severely dilated.  5. The mitral valve is abnormal. Moderate to severe mitral valve regurgitation, not fully visualized. The posterior leaflet was  restricted. No evidence of mitral stenosis.  6. Tricuspid valve regurgitation is mild to moderate.  7. The aortic valve is tricuspid. There is mild calcification of the aortic valve. Aortic valve regurgitation is not visualized. No aortic stenosis is present.  8. The inferior vena cava is dilated in size with <50% respiratory variability, suggesting right atrial pressure of  15 mmHg. FINDINGS  Left Ventricle: Left ventricular ejection fraction, by estimation, is 30%. The left ventricle has moderate to severely decreased function. The left ventricle demonstrates regional wall motion abnormalities. The left ventricular internal cavity size was moderately dilated. There is mild concentric left ventricular hypertrophy. Left ventricular diastolic parameters are indeterminate. Right Ventricle: The right ventricular size is moderately enlarged. No increase in right ventricular wall thickness. Right ventricular systolic function is low normal. There is moderately elevated pulmonary artery systolic pressure. The tricuspid regurgitant velocity is 3.35 m/s, and with an assumed right atrial pressure of 15 mmHg, the estimated right ventricular systolic pressure is 59.9 mmHg. Left Atrium: Left atrial size was severely dilated. Right Atrium: Right atrial size was severely dilated. Pericardium: Trivial pericardial effusion is present. Mitral Valve: The mitral valve is abnormal. Mild mitral annular calcification. Moderate to severe mitral valve regurgitation. No evidence of mitral valve stenosis. Tricuspid Valve: The tricuspid valve is normal in structure. Tricuspid valve regurgitation is mild to moderate. Aortic Valve: The aortic valve is tricuspid. There is mild calcification of the aortic valve. Aortic valve regurgitation is not visualized. No aortic stenosis is present. Aortic valve mean gradient measures 2.5 mmHg. Aortic valve peak gradient measures 5.5 mmHg. Aortic valve area, by VTI measures 2.88 cm. Pulmonic Valve: The  pulmonic valve was normal in structure. Pulmonic valve regurgitation is mild. Aorta: The aortic root is normal in size and structure. Venous: The inferior vena cava is dilated in size with less than 50% respiratory variability, suggesting right atrial pressure of 15 mmHg. IAS/Shunts: No atrial level shunt detected by color flow Doppler.  LEFT VENTRICLE PLAX 2D LVIDd:         7.00 cm LVIDs:         6.00 cm LV PW:         1.00 cm LV IVS:        1.30 cm LVOT diam:     2.10 cm LV SV:         55 LV SV Index:   27 LVOT Area:     3.46 cm  LV Volumes (MOD) LV vol d, MOD A2C: 248.0 ml LV vol d, MOD A4C: 235.0 ml LV vol s, MOD A2C: 156.0 ml LV SV MOD A2C:     92.0 ml LV SV MOD A4C:     235.0 ml RIGHT VENTRICLE             IVC RV Basal diam:  5.40 cm     IVC diam: 2.40 cm RV Mid diam:    5.00 cm RV S prime:     13.00 cm/s TAPSE (M-mode): 2.4 cm LEFT ATRIUM              Index        RIGHT ATRIUM           Index LA diam:        5.50 cm  2.68 cm/m   RA Area:     32.00 cm LA Vol (A2C):   180.0 ml 87.55 ml/m  RA Volume:   128.00 ml 62.26 ml/m LA Vol (A4C):   114.2 ml 55.55 ml/m LA Biplane Vol: 173.0 ml 84.14 ml/m  AORTIC VALVE                    PULMONIC VALVE AV Area (Vmax):    2.90 cm     PV Vmax:          0.90 m/s AV Area (Vmean):  2.68 cm     PV Peak grad:     3.2 mmHg AV Area (VTI):     2.88 cm     PR End Diast Vel: 9.73 msec AV Vmax:           117.50 cm/s AV Vmean:          77.600 cm/s AV VTI:            0.192 m AV Peak Grad:      5.5 mmHg AV Mean Grad:      2.5 mmHg LVOT Vmax:         98.50 cm/s LVOT Vmean:        60.100 cm/s LVOT VTI:          0.160 m LVOT/AV VTI ratio: 0.83  AORTA Ao Root diam: 3.50 cm Ao Asc diam:  3.70 cm TRICUSPID VALVE TR Peak grad:   44.9 mmHg TR Vmax:        335.00 cm/s  SHUNTS Systemic VTI:  0.16 m Systemic Diam: 2.10 cm Dalton McleanMD Electronically signed by Ezra Kanner Signature Date/Time: 07/12/2023/9:17:48 AM    Final    DG Chest Port 1 View Result Date: 07/10/2023 CLINICAL  DATA:  Shortness of breath EXAM: PORTABLE CHEST 1 VIEW COMPARISON:  Chest radiograph dated 01/15/2010 FINDINGS: Patient is rotated to the right. Low lung volumes with bronchovascular crowding. Diffuse interstitial opacities. Small right pleural effusion. No pneumothorax. Enlarged cardiomediastinal silhouette. No acute osseous abnormality. IMPRESSION: 1. Low lung volumes with bronchovascular crowding. Diffuse interstitial opacities, likely pulmonary edema. 2. Small right pleural effusion. 3. Marked cardiomegaly. Electronically Signed   By: Limin  Xu M.D.   On: 07/10/2023 17:01    Microbiology: No results found for this or any previous visit (from the past 240 hours).   Labs: Basic Metabolic Panel: Recent Labs  Lab 07/18/23 0211 07/19/23 0203 07/20/23 0218 07/21/23 0211 07/22/23 0209  NA 134* 135 138 135 137  K 4.6 3.8 4.2 4.1 4.4  CL 97* 98 100 99 100  CO2 27 25 27 24 26   GLUCOSE 137* 194* 117* 161* 136*  BUN 59* 65* 67* 76* 67*  CREATININE 1.63* 1.66* 1.57* 1.78* 1.85*  CALCIUM  9.1 8.7* 9.1 9.0 9.1  MG 2.3  --   --   --   --    Liver Function Tests: No results for input(s): AST, ALT, ALKPHOS, BILITOT, PROT, ALBUMIN in the last 168 hours. No results for input(s): LIPASE, AMYLASE in the last 168 hours. No results for input(s): AMMONIA in the last 168 hours. CBC: No results for input(s): WBC, NEUTROABS, HGB, HCT, MCV, PLT in the last 168 hours. Cardiac Enzymes: No results for input(s): CKTOTAL, CKMB, CKMBINDEX, TROPONINI in the last 168 hours. BNP: BNP (last 3 results) Recent Labs    07/10/23 1549  BNP >4,500.0*    ProBNP (last 3 results) Recent Labs    05/28/23 0850  PROBNP 24,067*    CBG: Recent Labs  Lab 07/21/23 0618 07/21/23 1132 07/21/23 1602 07/21/23 2115 07/22/23 0612  GLUCAP 151* 121* 129* 168* 169*       Signed:  Sigurd Pac MD.  [facility] 07/22/2023, 9:59 AM

## 2023-07-22 NOTE — TOC Transition Note (Signed)
 Transition of Care [facility]) - Discharge Note   Patient Details  Name: [name] MRN: [id number] Date of Birth: 1966-04-15  [facility] CM/SW Contact:  Roxie KANDICE Stain, RN Phone Number: 07/22/2023, 12:11 PM   Clinical Narrative:    Patient stable for discharge.  No TOC needs at this time.     Final next level of care: Home/Self Care Barriers to Discharge: Barriers Resolved   Patient Goals and CMS Choice Patient states their goals for this hospitalization and ongoing recovery are:: return home          Discharge Placement  home                     Discharge Plan and Services Additional resources added to the After Visit Summary for   In-house Referral: PCP / [facility] Discharge Planning Services: Follow-up appt scheduled                                 Social Drivers of Health (SDOH) Interventions SDOH Screenings   Food Insecurity: No Food Insecurity (07/11/2023)  Housing: Unknown (07/12/2023)  Recent Concern: Housing - High Risk (07/11/2023)  Transportation Needs: No Transportation Needs (07/12/2023)  Utilities: Not At Risk (07/11/2023)  Alcohol Screen: Low Risk  (07/12/2023)  Financial Resource Strain: Low Risk  (07/12/2023)  Tobacco Use: High Risk (07/10/2023)     Readmission Risk Interventions     No data to display

## 2023-07-23 ENCOUNTER — Encounter (HOSPITAL_COMMUNITY): Payer: 59

## 2023-07-27 ENCOUNTER — Telehealth (HOSPITAL_COMMUNITY): Payer: Self-pay

## 2023-07-27 NOTE — Telephone Encounter (Signed)
Attempted to call patient to confirm appointment tomorrow here in [facility] clinic for TOC. Unable to reach or leave message at this time.

## 2023-07-28 ENCOUNTER — Encounter (HOSPITAL_COMMUNITY): Payer: 59

## 2023-07-28 NOTE — Progress Notes (Incomplete)
HEART & VASCULAR TRANSITION OF CARE CONSULT NOTE     Referring Physician: Dr Jomarie Longs Primary Care: Primary Cardiologist:  Chief Complaint: Heart Failure   HPI: Referred to clinic by Dr Jomarie Longs for heart failure consultation.   [name] is a 58 year old with a history of HFrEF, cocaine abuse    Cardiac Testing    Past Medical History:  Diagnosis Date   Allergic rhinitis    Anxiety    Asthma    Bipolar disorder (HCC)    Bradycardia    CAD (coronary artery disease)    CVA (cerebral vascular accident) (HCC)    GERD (gastroesophageal reflux disease)    Headache    Hepatitis C    History of heart artery stent    History of myocardial infarction    History of stroke    Hypercholesterolemia    Hypertension    MI (myocardial infarction) (HCC)    Stroke (HCC)     Current Outpatient Medications  Medication Sig Dispense Refill   albuterol (VENTOLIN HFA) 108 (90 Base) MCG/ACT inhaler Inhale 2 puffs into the lungs every 4 (four) hours as needed for wheezing.     aspirin 81 MG EC tablet Chew 81 mg by mouth daily.     blood glucose meter kit and supplies Dispense based on patient and insurance preference. Use up to four times daily as directed. (FOR ICD-10 E10.9, E11.9). 1 each 0   bumetanide (BUMEX) 1 MG tablet Take 2 tablets (2 mg total) by mouth daily. 60 tablet 1   carvedilol (COREG) 12.5 MG tablet Take 3 tablets (37.5 mg total) by mouth 2 (two) times daily with a meal. 180 tablet 1   empagliflozin (JARDIANCE) 10 MG TABS tablet Take 1 tablet (10 mg total) by mouth daily. 30 tablet 1   finasteride (PROSCAR) 5 MG tablet Take 5 mg by mouth daily.     nitroGLYCERIN (NITROSTAT) 0.4 MG SL tablet Place 1 tablet (0.4 mg total) under the tongue every 5 (five) minutes as needed. 30 tablet 3   rosuvastatin (CRESTOR) 40 MG tablet Take 1 tablet (40 mg total) by mouth daily. 30 tablet 1   spironolactone (ALDACTONE) 25 MG tablet Take 0.5 tablets (12.5 mg total) by mouth daily. 15  tablet 1   tamsulosin (FLOMAX) 0.4 MG CAPS capsule Take 0.4 mg by mouth daily.  3   tiZANidine (ZANAFLEX) 4 MG tablet Take 4 mg by mouth 3 (three) times daily as needed for muscle spasms.     No current facility-administered medications for this visit.    Allergies  Allergen Reactions   Ace Inhibitors Swelling   Penicillins Swelling    Throat and eye swelling Has patient had a PCN reaction causing immediate rash, facial/tongue/throat swelling, SOB or lightheadedness with hypotension: Yes Has patient had a PCN reaction causing severe rash involving mucus membranes or skin necrosis: No Has patient had a PCN reaction that required hospitalization: No Has patient had a PCN reaction occurring within the last 10 years: No If all of the above answers are "NO", then may proceed with Cephalosporin use.      Social History   Socioeconomic History   Marital status: Single    Spouse name: Not on file   Number of children: 3   Years of education: Not on file   Highest education level: 11th grade  Occupational History   Occupation: disablity  Tobacco Use   Smoking status: Every Day    Types: Cigarettes  Smokeless tobacco: Never  Vaping Use   Vaping status: Never Used  Substance and Sexual Activity   Alcohol use: No   Drug use: No    Comment: last use cocaine "a couple months ago"   Sexual activity: Not on file  Other Topics Concern   Not on file  Social History Narrative   Not on file   Social Drivers of Health   Financial Resource Strain: Low Risk  (07/12/2023)   Overall Financial Resource Strain (CARDIA)    Difficulty of Paying Living Expenses: Not very hard  Food Insecurity: No Food Insecurity (07/11/2023)   Hunger Vital Sign    Worried About Running Out of Food in the Last Year: Never true    Ran Out of Food in the Last Year: Never true  Transportation Needs: No Transportation Needs (07/12/2023)   PRAPARE - Administrator, Civil Service (Medical): No    Lack  of Transportation (Non-Medical): No  Physical Activity: Not on file  Stress: Not on file  Social Connections: Not on file  Intimate Partner Violence: Not At Risk (07/11/2023)   Humiliation, Afraid, Rape, and Kick questionnaire    Fear of Current or Ex-Partner: No    Emotionally Abused: No    Physically Abused: No    Sexually Abused: No      Family History  Problem Relation Age of Onset   Colon polyps Mother    Stroke Father    Heart disease Maternal Grandmother    Hearing loss Maternal Grandmother    Skin cancer Maternal Grandmother    Asthma Maternal Aunt    Emphysema Maternal Aunt    Arthritis Maternal Uncle    Colon cancer Neg Hx    Esophageal cancer Neg Hx    Rectal cancer Neg Hx    Stomach cancer Neg Hx     There were no vitals filed for this visit.  PHYSICAL EXAM: General:  Well appearing. No respiratory difficulty HEENT: normal Neck: supple. no JVD. Carotids 2+ bilat; no bruits. No lymphadenopathy or thryomegaly appreciated. Cor: PMI nondisplaced. Regular rate & rhythm. No rubs, gallops or murmurs. Lungs: clear Abdomen: soft, nontender, nondistended. No hepatosplenomegaly. No bruits or masses. Good bowel sounds. Extremities: no cyanosis, clubbing, rash, edema Neuro: alert & oriented x 3, cranial nerves grossly intact. moves all 4 extremities w/o difficulty. Affect pleasant.  ECG:   ASSESSMENT & PLAN:  1. HFrEF Echo Ef 30% Etiology  NYHA *** GDMT  Diuretic- BB- Ace/ARB/ARNI MRA SGLT2i  2. Cocaine Abuse     Referred to HFSW (PCP, Medications, Transportation, ETOH Abuse, Drug Abuse, Insurance, Financial ): Yes or No Refer to Pharmacy: Yes or No Refer to Home Health: Yes on No Refer to Advanced Heart Failure Clinic: Yes or no  Refer to General Cardiology: Yes or No  Follow up

## 2023-08-10 ENCOUNTER — Ambulatory Visit: Payer: Self-pay | Admitting: Nurse Practitioner

## 2023-09-30 ENCOUNTER — Other Ambulatory Visit (HOSPITAL_COMMUNITY): Payer: Self-pay

## 2023-11-01 ENCOUNTER — Other Ambulatory Visit (HOSPITAL_COMMUNITY): Payer: Self-pay

## 2023-12-25 ENCOUNTER — Other Ambulatory Visit: Payer: Self-pay | Admitting: Cardiology

## 2024-01-05 DIAGNOSIS — T17920A Food in respiratory tract, part unspecified causing asphyxiation, initial encounter: Secondary | ICD-10-CM | POA: Diagnosis not present

## 2024-01-05 DIAGNOSIS — J811 Chronic pulmonary edema: Secondary | ICD-10-CM | POA: Diagnosis not present

## 2024-01-05 DIAGNOSIS — R609 Edema, unspecified: Secondary | ICD-10-CM | POA: Diagnosis not present

## 2024-01-05 DIAGNOSIS — R0602 Shortness of breath: Secondary | ICD-10-CM | POA: Diagnosis not present

## 2024-01-05 DIAGNOSIS — R6889 Other general symptoms and signs: Secondary | ICD-10-CM | POA: Diagnosis not present

## 2024-01-06 DIAGNOSIS — K219 Gastro-esophageal reflux disease without esophagitis: Secondary | ICD-10-CM | POA: Diagnosis not present

## 2024-01-06 DIAGNOSIS — Z91148 Patient's other noncompliance with medication regimen for other reason: Secondary | ICD-10-CM | POA: Diagnosis not present

## 2024-01-06 DIAGNOSIS — I2510 Atherosclerotic heart disease of native coronary artery without angina pectoris: Secondary | ICD-10-CM | POA: Diagnosis not present

## 2024-01-06 DIAGNOSIS — Z880 Allergy status to penicillin: Secondary | ICD-10-CM | POA: Diagnosis not present

## 2024-01-06 DIAGNOSIS — I160 Hypertensive urgency: Secondary | ICD-10-CM | POA: Diagnosis not present

## 2024-01-06 DIAGNOSIS — I340 Nonrheumatic mitral (valve) insufficiency: Secondary | ICD-10-CM | POA: Diagnosis not present

## 2024-01-06 DIAGNOSIS — J45909 Unspecified asthma, uncomplicated: Secondary | ICD-10-CM | POA: Diagnosis not present

## 2024-01-06 DIAGNOSIS — I5023 Acute on chronic systolic (congestive) heart failure: Secondary | ICD-10-CM | POA: Diagnosis not present

## 2024-01-06 DIAGNOSIS — I517 Cardiomegaly: Secondary | ICD-10-CM | POA: Diagnosis not present

## 2024-01-06 DIAGNOSIS — I509 Heart failure, unspecified: Secondary | ICD-10-CM | POA: Diagnosis not present

## 2024-01-06 DIAGNOSIS — I472 Ventricular tachycardia, unspecified: Secondary | ICD-10-CM | POA: Diagnosis not present

## 2024-01-06 DIAGNOSIS — F17210 Nicotine dependence, cigarettes, uncomplicated: Secondary | ICD-10-CM | POA: Diagnosis not present

## 2024-01-06 DIAGNOSIS — I4729 Other ventricular tachycardia: Secondary | ICD-10-CM | POA: Diagnosis not present

## 2024-01-06 DIAGNOSIS — E875 Hyperkalemia: Secondary | ICD-10-CM | POA: Diagnosis not present

## 2024-01-06 DIAGNOSIS — I255 Ischemic cardiomyopathy: Secondary | ICD-10-CM | POA: Diagnosis not present

## 2024-01-06 DIAGNOSIS — I110 Hypertensive heart disease with heart failure: Secondary | ICD-10-CM | POA: Diagnosis not present

## 2024-01-06 DIAGNOSIS — Z8673 Personal history of transient ischemic attack (TIA), and cerebral infarction without residual deficits: Secondary | ICD-10-CM | POA: Diagnosis not present

## 2024-01-06 DIAGNOSIS — I10 Essential (primary) hypertension: Secondary | ICD-10-CM | POA: Diagnosis not present

## 2024-01-06 DIAGNOSIS — Z7951 Long term (current) use of inhaled steroids: Secondary | ICD-10-CM | POA: Diagnosis not present

## 2024-01-06 DIAGNOSIS — I471 Supraventricular tachycardia, unspecified: Secondary | ICD-10-CM | POA: Diagnosis not present

## 2024-01-06 DIAGNOSIS — I252 Old myocardial infarction: Secondary | ICD-10-CM | POA: Diagnosis not present

## 2024-01-06 DIAGNOSIS — Z91199 Patient's noncompliance with other medical treatment and regimen due to unspecified reason: Secondary | ICD-10-CM | POA: Diagnosis not present

## 2024-01-06 DIAGNOSIS — Z79899 Other long term (current) drug therapy: Secondary | ICD-10-CM | POA: Diagnosis not present

## 2024-01-06 DIAGNOSIS — E785 Hyperlipidemia, unspecified: Secondary | ICD-10-CM | POA: Diagnosis not present

## 2024-01-06 DIAGNOSIS — I361 Nonrheumatic tricuspid (valve) insufficiency: Secondary | ICD-10-CM | POA: Diagnosis not present

## 2024-01-06 DIAGNOSIS — Z5902 Unsheltered homelessness: Secondary | ICD-10-CM | POA: Diagnosis not present

## 2024-01-06 DIAGNOSIS — Z955 Presence of coronary angioplasty implant and graft: Secondary | ICD-10-CM | POA: Diagnosis not present

## 2024-01-07 DIAGNOSIS — I2510 Atherosclerotic heart disease of native coronary artery without angina pectoris: Secondary | ICD-10-CM | POA: Diagnosis not present

## 2024-01-07 DIAGNOSIS — I472 Ventricular tachycardia, unspecified: Secondary | ICD-10-CM | POA: Diagnosis not present

## 2024-01-07 DIAGNOSIS — Z91199 Patient's noncompliance with other medical treatment and regimen due to unspecified reason: Secondary | ICD-10-CM | POA: Diagnosis not present

## 2024-01-07 DIAGNOSIS — I517 Cardiomegaly: Secondary | ICD-10-CM | POA: Diagnosis not present

## 2024-01-07 DIAGNOSIS — I361 Nonrheumatic tricuspid (valve) insufficiency: Secondary | ICD-10-CM | POA: Diagnosis not present

## 2024-01-07 DIAGNOSIS — I340 Nonrheumatic mitral (valve) insufficiency: Secondary | ICD-10-CM | POA: Diagnosis not present

## 2024-01-07 DIAGNOSIS — I509 Heart failure, unspecified: Secondary | ICD-10-CM | POA: Diagnosis not present

## 2024-01-08 DIAGNOSIS — I471 Supraventricular tachycardia, unspecified: Secondary | ICD-10-CM | POA: Diagnosis not present

## 2024-01-08 DIAGNOSIS — I2510 Atherosclerotic heart disease of native coronary artery without angina pectoris: Secondary | ICD-10-CM | POA: Diagnosis not present

## 2024-01-08 DIAGNOSIS — I509 Heart failure, unspecified: Secondary | ICD-10-CM | POA: Diagnosis not present

## 2024-01-08 DIAGNOSIS — I10 Essential (primary) hypertension: Secondary | ICD-10-CM | POA: Diagnosis not present

## 2024-01-08 DIAGNOSIS — E785 Hyperlipidemia, unspecified: Secondary | ICD-10-CM | POA: Diagnosis not present

## 2024-01-09 DIAGNOSIS — I471 Supraventricular tachycardia, unspecified: Secondary | ICD-10-CM | POA: Diagnosis not present

## 2024-01-09 DIAGNOSIS — I509 Heart failure, unspecified: Secondary | ICD-10-CM | POA: Diagnosis not present

## 2024-01-09 DIAGNOSIS — I2510 Atherosclerotic heart disease of native coronary artery without angina pectoris: Secondary | ICD-10-CM | POA: Diagnosis not present

## 2024-01-09 DIAGNOSIS — I10 Essential (primary) hypertension: Secondary | ICD-10-CM | POA: Diagnosis not present

## 2024-01-09 DIAGNOSIS — E785 Hyperlipidemia, unspecified: Secondary | ICD-10-CM | POA: Diagnosis not present

## 2024-01-10 DIAGNOSIS — I10 Essential (primary) hypertension: Secondary | ICD-10-CM | POA: Diagnosis not present

## 2024-01-10 DIAGNOSIS — I2510 Atherosclerotic heart disease of native coronary artery without angina pectoris: Secondary | ICD-10-CM | POA: Diagnosis not present

## 2024-01-10 DIAGNOSIS — I509 Heart failure, unspecified: Secondary | ICD-10-CM | POA: Diagnosis not present

## 2024-01-10 DIAGNOSIS — I471 Supraventricular tachycardia, unspecified: Secondary | ICD-10-CM | POA: Diagnosis not present

## 2024-01-10 DIAGNOSIS — E785 Hyperlipidemia, unspecified: Secondary | ICD-10-CM | POA: Diagnosis not present

## 2024-01-25 ENCOUNTER — Encounter: Payer: Self-pay | Admitting: Cardiology

## 2024-01-27 ENCOUNTER — Other Ambulatory Visit: Payer: Self-pay

## 2024-01-27 ENCOUNTER — Inpatient Hospital Stay (HOSPITAL_COMMUNITY)
Admission: EM | Admit: 2024-01-27 | Discharge: 2024-01-30 | DRG: 291 | Discharge status: Against Medical Advice | Attending: Internal Medicine | Admitting: Internal Medicine

## 2024-01-27 ENCOUNTER — Encounter (HOSPITAL_COMMUNITY): Payer: Self-pay

## 2024-01-27 ENCOUNTER — Emergency Department (HOSPITAL_COMMUNITY)

## 2024-01-27 DIAGNOSIS — B1920 Unspecified viral hepatitis C without hepatic coma: Secondary | ICD-10-CM | POA: Diagnosis present

## 2024-01-27 DIAGNOSIS — Z955 Presence of coronary angioplasty implant and graft: Secondary | ICD-10-CM

## 2024-01-27 DIAGNOSIS — Z7982 Long term (current) use of aspirin: Secondary | ICD-10-CM

## 2024-01-27 DIAGNOSIS — J45909 Unspecified asthma, uncomplicated: Secondary | ICD-10-CM | POA: Diagnosis not present

## 2024-01-27 DIAGNOSIS — E1122 Type 2 diabetes mellitus with diabetic chronic kidney disease: Secondary | ICD-10-CM | POA: Diagnosis not present

## 2024-01-27 DIAGNOSIS — Z7984 Long term (current) use of oral hypoglycemic drugs: Secondary | ICD-10-CM | POA: Diagnosis not present

## 2024-01-27 DIAGNOSIS — F1290 Cannabis use, unspecified, uncomplicated: Secondary | ICD-10-CM | POA: Diagnosis not present

## 2024-01-27 DIAGNOSIS — Z5900 Homelessness unspecified: Secondary | ICD-10-CM | POA: Diagnosis not present

## 2024-01-27 DIAGNOSIS — F17210 Nicotine dependence, cigarettes, uncomplicated: Secondary | ICD-10-CM | POA: Diagnosis present

## 2024-01-27 DIAGNOSIS — N1832 Chronic kidney disease, stage 3b: Secondary | ICD-10-CM | POA: Diagnosis present

## 2024-01-27 DIAGNOSIS — Z888 Allergy status to other drugs, medicaments and biological substances status: Secondary | ICD-10-CM | POA: Diagnosis not present

## 2024-01-27 DIAGNOSIS — Z716 Tobacco abuse counseling: Secondary | ICD-10-CM

## 2024-01-27 DIAGNOSIS — D631 Anemia in chronic kidney disease: Secondary | ICD-10-CM | POA: Diagnosis not present

## 2024-01-27 DIAGNOSIS — I252 Old myocardial infarction: Secondary | ICD-10-CM

## 2024-01-27 DIAGNOSIS — I2489 Other forms of acute ischemic heart disease: Secondary | ICD-10-CM | POA: Diagnosis not present

## 2024-01-27 DIAGNOSIS — F1490 Cocaine use, unspecified, uncomplicated: Secondary | ICD-10-CM | POA: Diagnosis present

## 2024-01-27 DIAGNOSIS — R072 Precordial pain: Secondary | ICD-10-CM | POA: Diagnosis not present

## 2024-01-27 DIAGNOSIS — Z79899 Other long term (current) drug therapy: Secondary | ICD-10-CM

## 2024-01-27 DIAGNOSIS — Z5329 Procedure and treatment not carried out because of patient's decision for other reasons: Secondary | ICD-10-CM | POA: Diagnosis present

## 2024-01-27 DIAGNOSIS — F419 Anxiety disorder, unspecified: Secondary | ICD-10-CM | POA: Diagnosis present

## 2024-01-27 DIAGNOSIS — Z8679 Personal history of other diseases of the circulatory system: Secondary | ICD-10-CM

## 2024-01-27 DIAGNOSIS — I358 Other nonrheumatic aortic valve disorders: Secondary | ICD-10-CM | POA: Diagnosis not present

## 2024-01-27 DIAGNOSIS — I5041 Acute combined systolic (congestive) and diastolic (congestive) heart failure: Secondary | ICD-10-CM | POA: Diagnosis not present

## 2024-01-27 DIAGNOSIS — R7989 Other specified abnormal findings of blood chemistry: Secondary | ICD-10-CM

## 2024-01-27 DIAGNOSIS — R0989 Other specified symptoms and signs involving the circulatory and respiratory systems: Secondary | ICD-10-CM | POA: Diagnosis not present

## 2024-01-27 DIAGNOSIS — Z808 Family history of malignant neoplasm of other organs or systems: Secondary | ICD-10-CM

## 2024-01-27 DIAGNOSIS — Z825 Family history of asthma and other chronic lower respiratory diseases: Secondary | ICD-10-CM

## 2024-01-27 DIAGNOSIS — E119 Type 2 diabetes mellitus without complications: Secondary | ICD-10-CM

## 2024-01-27 DIAGNOSIS — J810 Acute pulmonary edema: Secondary | ICD-10-CM | POA: Diagnosis not present

## 2024-01-27 DIAGNOSIS — Z8249 Family history of ischemic heart disease and other diseases of the circulatory system: Secondary | ICD-10-CM

## 2024-01-27 DIAGNOSIS — R0602 Shortness of breath: Secondary | ICD-10-CM | POA: Diagnosis not present

## 2024-01-27 DIAGNOSIS — F32A Depression, unspecified: Secondary | ICD-10-CM | POA: Diagnosis present

## 2024-01-27 DIAGNOSIS — E7800 Pure hypercholesterolemia, unspecified: Secondary | ICD-10-CM | POA: Diagnosis not present

## 2024-01-27 DIAGNOSIS — R079 Chest pain, unspecified: Secondary | ICD-10-CM | POA: Diagnosis not present

## 2024-01-27 DIAGNOSIS — N183 Chronic kidney disease, stage 3 unspecified: Secondary | ICD-10-CM | POA: Insufficient documentation

## 2024-01-27 DIAGNOSIS — I2510 Atherosclerotic heart disease of native coronary artery without angina pectoris: Secondary | ICD-10-CM | POA: Diagnosis not present

## 2024-01-27 DIAGNOSIS — I10 Essential (primary) hypertension: Secondary | ICD-10-CM | POA: Diagnosis not present

## 2024-01-27 DIAGNOSIS — I493 Ventricular premature depolarization: Secondary | ICD-10-CM | POA: Diagnosis present

## 2024-01-27 DIAGNOSIS — I130 Hypertensive heart and chronic kidney disease with heart failure and stage 1 through stage 4 chronic kidney disease, or unspecified chronic kidney disease: Principal | ICD-10-CM | POA: Diagnosis present

## 2024-01-27 DIAGNOSIS — Z83719 Family history of colon polyps, unspecified: Secondary | ICD-10-CM

## 2024-01-27 DIAGNOSIS — Z7151 Drug abuse counseling and surveillance of drug abuser: Secondary | ICD-10-CM

## 2024-01-27 DIAGNOSIS — I517 Cardiomegaly: Secondary | ICD-10-CM | POA: Diagnosis not present

## 2024-01-27 DIAGNOSIS — Z880 Allergy status to penicillin: Secondary | ICD-10-CM

## 2024-01-27 DIAGNOSIS — R069 Unspecified abnormalities of breathing: Secondary | ICD-10-CM | POA: Diagnosis not present

## 2024-01-27 DIAGNOSIS — I5043 Acute on chronic combined systolic (congestive) and diastolic (congestive) heart failure: Secondary | ICD-10-CM | POA: Diagnosis present

## 2024-01-27 DIAGNOSIS — I499 Cardiac arrhythmia, unspecified: Secondary | ICD-10-CM | POA: Diagnosis not present

## 2024-01-27 DIAGNOSIS — Z8673 Personal history of transient ischemic attack (TIA), and cerebral infarction without residual deficits: Secondary | ICD-10-CM

## 2024-01-27 DIAGNOSIS — F1491 Cocaine use, unspecified, in remission: Secondary | ICD-10-CM | POA: Insufficient documentation

## 2024-01-27 DIAGNOSIS — Z823 Family history of stroke: Secondary | ICD-10-CM

## 2024-01-27 DIAGNOSIS — K219 Gastro-esophageal reflux disease without esophagitis: Secondary | ICD-10-CM | POA: Diagnosis present

## 2024-01-27 DIAGNOSIS — Z743 Need for continuous supervision: Secondary | ICD-10-CM | POA: Diagnosis not present

## 2024-01-27 LAB — CBC
HCT: 37.3 % — ABNORMAL LOW (ref 39.0–52.0)
Hemoglobin: 12.2 g/dL — ABNORMAL LOW (ref 13.0–17.0)
MCH: 30.5 pg (ref 26.0–34.0)
MCHC: 32.7 g/dL (ref 30.0–36.0)
MCV: 93.3 fL (ref 80.0–100.0)
Platelets: 256 K/uL (ref 150–400)
RBC: 4 MIL/uL — ABNORMAL LOW (ref 4.22–5.81)
RDW: 15.4 % (ref 11.5–15.5)
WBC: 7.3 K/uL (ref 4.0–10.5)
nRBC: 0 % (ref 0.0–0.2)

## 2024-01-27 NOTE — ED Notes (Signed)
 Patient transported to X-ray

## 2024-01-27 NOTE — ED Triage Notes (Signed)
 PER EMS: pt reports shortness of breath and constant chest pressure onset tonight around 8pm. Hx of MI. EMS adm  324 aspirin  en route.   BP- 200/140, HR-76, 97% RA, RR-18,

## 2024-01-28 ENCOUNTER — Encounter (HOSPITAL_COMMUNITY): Payer: Self-pay | Admitting: Internal Medicine

## 2024-01-28 ENCOUNTER — Other Ambulatory Visit (HOSPITAL_COMMUNITY)

## 2024-01-28 DIAGNOSIS — E7800 Pure hypercholesterolemia, unspecified: Secondary | ICD-10-CM | POA: Diagnosis present

## 2024-01-28 DIAGNOSIS — Z888 Allergy status to other drugs, medicaments and biological substances status: Secondary | ICD-10-CM | POA: Diagnosis not present

## 2024-01-28 DIAGNOSIS — F1491 Cocaine use, unspecified, in remission: Secondary | ICD-10-CM | POA: Insufficient documentation

## 2024-01-28 DIAGNOSIS — I5043 Acute on chronic combined systolic (congestive) and diastolic (congestive) heart failure: Secondary | ICD-10-CM | POA: Diagnosis present

## 2024-01-28 DIAGNOSIS — F1290 Cannabis use, unspecified, uncomplicated: Secondary | ICD-10-CM | POA: Insufficient documentation

## 2024-01-28 DIAGNOSIS — Z8673 Personal history of transient ischemic attack (TIA), and cerebral infarction without residual deficits: Secondary | ICD-10-CM

## 2024-01-28 DIAGNOSIS — F32A Depression, unspecified: Secondary | ICD-10-CM | POA: Diagnosis present

## 2024-01-28 DIAGNOSIS — Z8679 Personal history of other diseases of the circulatory system: Secondary | ICD-10-CM

## 2024-01-28 DIAGNOSIS — Z5329 Procedure and treatment not carried out because of patient's decision for other reasons: Secondary | ICD-10-CM | POA: Diagnosis present

## 2024-01-28 DIAGNOSIS — N1832 Chronic kidney disease, stage 3b: Secondary | ICD-10-CM | POA: Diagnosis present

## 2024-01-28 DIAGNOSIS — R7989 Other specified abnormal findings of blood chemistry: Secondary | ICD-10-CM

## 2024-01-28 DIAGNOSIS — R072 Precordial pain: Secondary | ICD-10-CM | POA: Diagnosis present

## 2024-01-28 DIAGNOSIS — I2510 Atherosclerotic heart disease of native coronary artery without angina pectoris: Secondary | ICD-10-CM | POA: Diagnosis present

## 2024-01-28 DIAGNOSIS — I10 Essential (primary) hypertension: Secondary | ICD-10-CM | POA: Diagnosis not present

## 2024-01-28 DIAGNOSIS — I130 Hypertensive heart and chronic kidney disease with heart failure and stage 1 through stage 4 chronic kidney disease, or unspecified chronic kidney disease: Secondary | ICD-10-CM | POA: Diagnosis present

## 2024-01-28 DIAGNOSIS — E119 Type 2 diabetes mellitus without complications: Secondary | ICD-10-CM | POA: Diagnosis not present

## 2024-01-28 DIAGNOSIS — F17210 Nicotine dependence, cigarettes, uncomplicated: Secondary | ICD-10-CM | POA: Diagnosis present

## 2024-01-28 DIAGNOSIS — B1920 Unspecified viral hepatitis C without hepatic coma: Secondary | ICD-10-CM | POA: Diagnosis present

## 2024-01-28 DIAGNOSIS — Z7151 Drug abuse counseling and surveillance of drug abuser: Secondary | ICD-10-CM | POA: Diagnosis not present

## 2024-01-28 DIAGNOSIS — I5041 Acute combined systolic (congestive) and diastolic (congestive) heart failure: Secondary | ICD-10-CM | POA: Diagnosis not present

## 2024-01-28 DIAGNOSIS — D631 Anemia in chronic kidney disease: Secondary | ICD-10-CM | POA: Diagnosis present

## 2024-01-28 DIAGNOSIS — F1490 Cocaine use, unspecified, uncomplicated: Secondary | ICD-10-CM

## 2024-01-28 DIAGNOSIS — I2489 Other forms of acute ischemic heart disease: Secondary | ICD-10-CM | POA: Diagnosis present

## 2024-01-28 DIAGNOSIS — J45909 Unspecified asthma, uncomplicated: Secondary | ICD-10-CM | POA: Diagnosis present

## 2024-01-28 DIAGNOSIS — Z8249 Family history of ischemic heart disease and other diseases of the circulatory system: Secondary | ICD-10-CM | POA: Diagnosis not present

## 2024-01-28 DIAGNOSIS — J810 Acute pulmonary edema: Secondary | ICD-10-CM

## 2024-01-28 DIAGNOSIS — I252 Old myocardial infarction: Secondary | ICD-10-CM | POA: Diagnosis not present

## 2024-01-28 DIAGNOSIS — E1122 Type 2 diabetes mellitus with diabetic chronic kidney disease: Secondary | ICD-10-CM | POA: Diagnosis present

## 2024-01-28 DIAGNOSIS — Z955 Presence of coronary angioplasty implant and graft: Secondary | ICD-10-CM | POA: Diagnosis not present

## 2024-01-28 DIAGNOSIS — N183 Chronic kidney disease, stage 3 unspecified: Secondary | ICD-10-CM

## 2024-01-28 DIAGNOSIS — Z7984 Long term (current) use of oral hypoglycemic drugs: Secondary | ICD-10-CM | POA: Diagnosis not present

## 2024-01-28 DIAGNOSIS — Z5900 Homelessness unspecified: Secondary | ICD-10-CM | POA: Diagnosis not present

## 2024-01-28 DIAGNOSIS — I358 Other nonrheumatic aortic valve disorders: Secondary | ICD-10-CM | POA: Diagnosis present

## 2024-01-28 HISTORY — DX: Cocaine use, unspecified, uncomplicated: F14.90

## 2024-01-28 HISTORY — DX: Chronic kidney disease, stage 3 unspecified: N18.30

## 2024-01-28 HISTORY — DX: Other specified abnormal findings of blood chemistry: R79.89

## 2024-01-28 HISTORY — DX: Acute pulmonary edema: J81.0

## 2024-01-28 HISTORY — DX: Personal history of other diseases of the circulatory system: Z86.79

## 2024-01-28 HISTORY — DX: Cannabis use, unspecified, uncomplicated: F12.90

## 2024-01-28 HISTORY — DX: Cocaine use, unspecified, in remission: F14.91

## 2024-01-28 LAB — BASIC METABOLIC PANEL WITH GFR
Anion gap: 8 (ref 5–15)
BUN: 26 mg/dL — ABNORMAL HIGH (ref 6–20)
CO2: 17 mmol/L — ABNORMAL LOW (ref 22–32)
Calcium: 8.6 mg/dL — ABNORMAL LOW (ref 8.9–10.3)
Chloride: 111 mmol/L (ref 98–111)
Creatinine, Ser: 1.42 mg/dL — ABNORMAL HIGH (ref 0.61–1.24)
GFR, Estimated: 57 mL/min — ABNORMAL LOW (ref >60)
Glucose, Bld: 109 mg/dL — ABNORMAL HIGH (ref 70–99)
Potassium: 3.6 mmol/L (ref 3.5–5.1)
Sodium: 136 mmol/L (ref 135–145)

## 2024-01-28 LAB — HEMOGLOBIN A1C
Hgb A1c MFr Bld: 7.1 % — ABNORMAL HIGH (ref 4.8–5.6)
Mean Plasma Glucose: 157.07 mg/dL

## 2024-01-28 LAB — COMPREHENSIVE METABOLIC PANEL WITH GFR
ALT: 16 U/L (ref 0–44)
AST: 23 U/L (ref 15–41)
Albumin: 3 g/dL — ABNORMAL LOW (ref 3.5–5.0)
Alkaline Phosphatase: 77 U/L (ref 38–126)
Anion gap: 9 (ref 5–15)
BUN: 25 mg/dL — ABNORMAL HIGH (ref 6–20)
CO2: 20 mmol/L — ABNORMAL LOW (ref 22–32)
Calcium: 8.6 mg/dL — ABNORMAL LOW (ref 8.9–10.3)
Chloride: 108 mmol/L (ref 98–111)
Creatinine, Ser: 1.52 mg/dL — ABNORMAL HIGH (ref 0.61–1.24)
GFR, Estimated: 53 mL/min — ABNORMAL LOW (ref >60)
Glucose, Bld: 191 mg/dL — ABNORMAL HIGH (ref 70–99)
Potassium: 4.1 mmol/L (ref 3.5–5.1)
Sodium: 137 mmol/L (ref 135–145)
Total Bilirubin: 1.2 mg/dL (ref 0.0–1.2)
Total Protein: 6.6 g/dL (ref 6.5–8.1)

## 2024-01-28 LAB — CBC
HCT: 40.2 % (ref 39.0–52.0)
Hemoglobin: 12.7 g/dL — ABNORMAL LOW (ref 13.0–17.0)
MCH: 30.8 pg (ref 26.0–34.0)
MCHC: 31.6 g/dL (ref 30.0–36.0)
MCV: 97.3 fL (ref 80.0–100.0)
Platelets: 250 K/uL (ref 150–400)
RBC: 4.13 MIL/uL — ABNORMAL LOW (ref 4.22–5.81)
RDW: 15.4 % (ref 11.5–15.5)
WBC: 7 K/uL (ref 4.0–10.5)
nRBC: 0 % (ref 0.0–0.2)

## 2024-01-28 LAB — TROPONIN I (HIGH SENSITIVITY)
Troponin I (High Sensitivity): 36 ng/L — ABNORMAL HIGH (ref <18)
Troponin I (High Sensitivity): 35 ng/L — ABNORMAL HIGH (ref <18)

## 2024-01-28 LAB — RAPID URINE DRUG SCREEN, HOSP PERFORMED
Amphetamines: NOT DETECTED
Barbiturates: NOT DETECTED
Benzodiazepines: NOT DETECTED
Cocaine: POSITIVE — AB
Opiates: NOT DETECTED
Tetrahydrocannabinol: NOT DETECTED

## 2024-01-28 LAB — GLUCOSE, CAPILLARY
Glucose-Capillary: 99 mg/dL (ref 70–99)
Glucose-Capillary: 150 mg/dL — ABNORMAL HIGH (ref 70–99)

## 2024-01-28 LAB — CBG MONITORING, ED
Glucose-Capillary: 133 mg/dL — ABNORMAL HIGH (ref 70–99)
Glucose-Capillary: 130 mg/dL — ABNORMAL HIGH (ref 70–99)

## 2024-01-28 LAB — BRAIN NATRIURETIC PEPTIDE: B Natriuretic Peptide: >4500 pg/mL — ABNORMAL HIGH (ref 0.0–100.0)

## 2024-01-28 MED ORDER — EMPAGLIFLOZIN 10 MG PO TABS
10.0000 mg | ORAL_TABLET | Freq: Every day | ORAL | Status: DC
  Administered 2024-01-28 – 2024-01-30 (×3): 10 mg via ORAL
  Filled 2024-01-28 (×3): qty 1

## 2024-01-28 MED ORDER — ENOXAPARIN SODIUM 40 MG/0.4ML IJ SOSY
40.0000 mg | PREFILLED_SYRINGE | INTRAMUSCULAR | Status: DC
  Administered 2024-01-28 – 2024-01-30 (×3): 40 mg via SUBCUTANEOUS
  Filled 2024-01-28 (×3): qty 0.4

## 2024-01-28 MED ORDER — ONDANSETRON HCL 4 MG/2ML IJ SOLN: 4.0000 mg | Freq: Four times a day (QID) | INTRAMUSCULAR | Status: DC | PRN

## 2024-01-28 MED ORDER — NITROGLYCERIN 2 % TD OINT
1.0000 [in_us] | TOPICAL_OINTMENT | Freq: Once | TRANSDERMAL | Status: AC
  Administered 2024-01-28: 1 [in_us] via TOPICAL
  Filled 2024-01-28: qty 1

## 2024-01-28 MED ORDER — ASPIRIN 81 MG PO TBEC
81.0000 mg | DELAYED_RELEASE_TABLET | Freq: Every day | ORAL | Status: DC
  Administered 2024-01-28 – 2024-01-30 (×3): 81 mg via ORAL
  Filled 2024-01-28 (×3): qty 1

## 2024-01-28 MED ORDER — SODIUM CHLORIDE 0.9% FLUSH: 3.0000 mL | INTRAVENOUS | Status: DC | PRN

## 2024-01-28 MED ORDER — FUROSEMIDE 10 MG/ML IJ SOLN
80.0000 mg | Freq: Two times a day (BID) | INTRAMUSCULAR | Status: DC
  Administered 2024-01-28 – 2024-01-29 (×3): 80 mg via INTRAVENOUS
  Filled 2024-01-28 (×3): qty 8

## 2024-01-28 MED ORDER — ROSUVASTATIN CALCIUM 20 MG PO TABS
40.0000 mg | ORAL_TABLET | Freq: Every day | ORAL | Status: DC
Start: 2024-01-28 — End: 2024-01-30
  Administered 2024-01-28 – 2024-01-30 (×3): 40 mg via ORAL
  Filled 2024-01-28 (×3): qty 2

## 2024-01-28 MED ORDER — ALBUTEROL SULFATE (2.5 MG/3ML) 0.083% IN NEBU: 2.5000 mg | INHALATION_SOLUTION | RESPIRATORY_TRACT | Status: DC | PRN

## 2024-01-28 MED ORDER — SPIRONOLACTONE 12.5 MG HALF TABLET
12.5000 mg | ORAL_TABLET | Freq: Every day | ORAL | Status: DC
  Administered 2024-01-28 – 2024-01-30 (×3): 12.5 mg via ORAL
  Filled 2024-01-28 (×3): qty 1

## 2024-01-28 MED ORDER — TAMSULOSIN HCL 0.4 MG PO CAPS
0.4000 mg | ORAL_CAPSULE | Freq: Every day | ORAL | Status: DC
  Administered 2024-01-28 – 2024-01-30 (×3): 0.4 mg via ORAL
  Filled 2024-01-28 (×3): qty 1

## 2024-01-28 MED ORDER — HYDRALAZINE HCL 20 MG/ML IJ SOLN
20.0000 mg | Freq: Four times a day (QID) | INTRAMUSCULAR | Status: DC | PRN
  Administered 2024-01-28 – 2024-01-29 (×2): 20 mg via INTRAVENOUS
  Filled 2024-01-28 (×2): qty 1

## 2024-01-28 MED ORDER — ACETAMINOPHEN 325 MG PO TABS: 650.0000 mg | ORAL_TABLET | Freq: Four times a day (QID) | ORAL | Status: DC | PRN

## 2024-01-28 MED ORDER — SPIRONOLACTONE 12.5 MG HALF TABLET: 12.5000 mg | ORAL_TABLET | Freq: Every day | ORAL | Status: DC

## 2024-01-28 MED ORDER — INSULIN ASPART 100 UNIT/ML IJ SOLN: 0.0000 [IU] | Freq: Three times a day (TID) | INTRAMUSCULAR | Status: DC

## 2024-01-28 MED ORDER — ACETAMINOPHEN 650 MG RE SUPP: 650.0000 mg | Freq: Four times a day (QID) | RECTAL | Status: DC | PRN

## 2024-01-28 MED ORDER — FUROSEMIDE 10 MG/ML IJ SOLN
80.0000 mg | Freq: Once | INTRAMUSCULAR | Status: AC
  Administered 2024-01-28: 80 mg via INTRAVENOUS
  Filled 2024-01-28: qty 8

## 2024-01-28 MED ORDER — SODIUM CHLORIDE 0.9% FLUSH
3.0000 mL | Freq: Two times a day (BID) | INTRAVENOUS | Status: DC
  Administered 2024-01-28 – 2024-01-30 (×5): 3 mL via INTRAVENOUS

## 2024-01-28 MED ORDER — FUROSEMIDE 10 MG/ML IJ SOLN: 80.0000 mg | Freq: Two times a day (BID) | INTRAMUSCULAR | Status: DC

## 2024-01-28 MED ORDER — SODIUM CHLORIDE 0.9 % IV SOLN
250.0000 mL | INTRAVENOUS | Status: AC | PRN
Start: 2024-01-28 — End: 2024-01-29

## 2024-01-28 MED ORDER — ONDANSETRON HCL 4 MG PO TABS: 4.0000 mg | ORAL_TABLET | Freq: Four times a day (QID) | ORAL | Status: DC | PRN

## 2024-01-28 MED ORDER — FINASTERIDE 5 MG PO TABS
5.0000 mg | ORAL_TABLET | Freq: Every day | ORAL | Status: DC
  Administered 2024-01-28 – 2024-01-30 (×3): 5 mg via ORAL
  Filled 2024-01-28 (×3): qty 1

## 2024-01-28 NOTE — Progress Notes (Signed)
 CSW added substance abuse resources to patient's AVS.  Niels Portugal, MSW, LCSW Transitions of Care  Clinical Social Worker II [phone]

## 2024-01-28 NOTE — Discharge Instructions (Addendum)
 Outpatient Substance Abuse  Treatment- uninsured  Narcotics Anonymous 24-HOUR HELPLINE Pre-recorded for Meeting Schedules [facility] [phone]  [email or web address] ALCOHOLICS ANONYMOUS  [place] [facility]   Answering Service [phone] Please Note: All [place] Meetings are Non-smoking [email or web address]  Al[facility] Drug Services -  Insurance: Medicaid /State funding/private insurance Methadone, suboxone/Intensive outpatient  [place]   [phone] Fax: [phone] [address] [place], [place], [address] [place] [phone] Fax: [phone]    [address] [place], [place], [address] ([address] [place], [place], [place], [place], [place], [place], [place], [place]) [facility] [email or web address] Accepts State funding/Medicaid Transitional housing, Intensive Outpatient Treatment, Outpatient treatment, Veterans Services  Phone: [phone] Fax: [phone] Address: [address], [place] [address]   [facility] [employer] ([email or web address] Insurance: Medicaid Case Management, Administrator, arts, Medication Management, Outpatient Therapy, Psychosocial Rehabilitation, Substance Abuse Intensive Outpatient  Phone: [phone] Fax: [phone] [address], [place], [place], [address]  [facility] Medicaid, most private insurance providers Types of Program: Individual/Group Therapy, Substance Abuse Treatment  Phone: [place] [phone] Fax: [phone] [address], [place], [place], [address] [place] [phone] [address] [place], [place], [address]  [facility]  Medicaid Types of Program: [facility]  Phone: [phone] Fax: [phone] [address] [place], [place], [place], [address] [facility] Medicaid/state funds Crisis line [phone] [facility] [phone] [place] [phone]  [place] [phone]  [facility] [address];  [place], [place] [address] [phone]  Substance Abuse Intensive Outpatient Program [facility] [address] [place], [place] [address] [phone]- Substance abuse treatment  Successful Transitions  Insurance: [facility], [address], sliding scale Types of Program: substance abuse treatment, transportation assistance Phone: [phone] Fax: [phone] Address: [address], [place] [address] [facility] ([email or web address]) Insurance: UHC, [place], [place], [place] Program: addiction counseling, detoxification,  Phone: [phone]  Fax: [phone] Address: [address], [place] [address]  [facility] (statewide facilities/programs) [address] (Medicaid/state funds) [place], [place] [address]                      [email or web address] [phone] [place] mcalpine- [phone] [place]- [phone] [facility] [place] (2 Locations) (Medicaid/state funds) --[address]  walk in 8:30-12 and 1-2:30 [place], [id number]   [facility]- [phone] --[address] [place], [place] [address]  [phone] walk in 8:30-12 and 2-3:30  [facility] state funds/medicaid [address] [place], [place] [address] [phone] [facility] (Suboxone clinic) Medicaid/state funds  [address]  [place], [place] [address] [phone]   [facility]  [address], [place], [place] [address]  [phone] (24 hours) [place]- [address] [place], [place] [address]  [phone] (24 hours) [place]- [address] [place] [phone] [place]- [address] [place] [phone] [place] [address] [place] [phone] [facility] [place]- Medicaid and state funds  [place]- [address] [place], [place] [address] [phone] (24 hours) [place]- [address] [place],  [place] [address] [phone] [facility]- [address] [place], [place] [address] [phone] (24 hours) [place] [address]  [address] [place], [place]  [address] [phone] [place]- [address] [place] [phone]     Heart Failure Nutrition Therapy For The Undernourished  This nutrition therapy will help you eat more calories and protein in addition to helping your heart. These suggestions can help you if you can't eat enough, have lost weight, or need extra calories and protein in your diet. This plan focuses on: Eating more calories.  Eating more calories can give you more energy, help you gain weight, and promote wound healing. Eating more protein. Protein can help you heal, give you energy, and build and repair muscle. Ask your registered dietitian nutritionist (RDN) how much protein is the right amount for you. Eating a low-sodium diet while increasing your calorie and protein intake. This will help control buildup of fluids around your heart, stomach, lungs, and legs. Too much sodium may make your blood pressure too high and put stress on your heart. You can achieve these goals by: Eating more calories and protein. Eating less than 2,000 milligrams of sodium per day. Reading food labels to keep track of how much sodium, protein, and calories are in the foods you eat. Limiting fluid intake if your doctor has asked you to follow a fluid restriction. Reading the Food Label: How Much Sodium Is too Much? The nutrition plan for heart failure usually limits the sodium that you get from food and beverages to 2,000 milligrams per day. Salt is the main source of sodium. Read the nutrition label to find out how much sodium is in 1 serving. Foods with more than 300 milligrams of sodium per serving may not fit into a reduced-sodium meal plan. Check serving sizes on the label. If you eat more than 1 serving, you will get more sodium than the amount listed. You can find protein and calories on  the Nutrition Facts label too. Eating More Protein and Calories Eat at least 6 small meals throughout the day. If you become full quickly after you begin eating, you may benefit from small, frequent meals instead of 3 large meals. Keep high-calorie and high-protein snacks available in your car or bag for when you get hungry. Add extra calories and protein when cooking meals. Cook with unsalted butter, margarine, or oils instead of calorie-free cooking spray. Use reduced-fat (2%) milk instead of fat-free (skim) milk. Ask your RDN if whole milk is recommended for you. Add milk powder to protein shakes, cereal, or casseroles. Sprinkle unsalted nuts and seeds into your cereal, stir-fry, or salad. Add 1 tablespoon mayonnaise, sugar, or honey to foods (adds 50 to 100 calories). Add calories and protein to your fruits and vegetables. Fruits are low in calories. Add peanut butter, yogurt, or cottage cheese to add calories and protein. Buy fruit cups canned in syrup instead of water or juice. Vegetables are also low in calories. Add butter, sour cream, margarine, or oil for extra calories. Potatoes, corn, and peas are higher in calories than nonstarchy vegetables. Avocados are rich in calories and healthy fat. Eat them alone or use them as a topping or spread. Limit foods that have little to no nutrition. Eat fewer calorie-free and low-calorie foods such as applesauce, Jell-O, and diet soft drinks. These foods will take up space in your stomach and may leave little room for higher-calorie foods and beverages.  When shopping, avoid products that say "low calorie" or "low fat." Drink high-calorie beverages. There are several liquid nutrition supplements available that also have less than 300 milligrams of sodium per  serving. Drink them between meals as snacks.  Make your own supplement at home with milk or ice cream.  Ask your RDN if protein powder would also be a good addition. Milk and juice are high  in calories. Limit plain tea, coffee, and water. These have no calories.  Foods Recommended Food Group Foods Recommended  Grains Whole wheat bread with less than 80 milligrams of sodium per slice  Many cold cereals, especially shredded wheat and granola Oats or cream of wheat made with milk (add butter or margarine, sugar, dried fruit, and nuts for more calories and fat) Quinoa Wheat germ (sprinkle on soups, baked goods, or cereal)  Vegetables Fresh and frozen vegetables without added sauces, salt, or sodium Homemade soups (salt free or low sodium) with dry milk powder or cream Potatoes, corn, and peas  Fruits Canned fruits (canned in heavy syrup) Dried fruits, such as raisins, cranberries, and prunes Avocados  Dairy (Milk and Milk Products) Milk or milk powder Soy milk Yogurt, including Greek yogurt Small amounts of natural, blocked cheeses or reduced-sodium cheese (Swiss, ricotta, and fresh mozzarella are lower in sodium than others) Regular or soft cream cheese and low-sodium cottage cheese  Protein Foods (Meat, Poultry, Fish, and Armed forces logistics/support/administrative officer) [employer] and fish [place] bacon (check the Nutrition Facts label to make sure its not packaged in a sodium solution) Tuna canned or packed in oil Dried beans, peas, and legumes; edamame (fresh soybeans) Eggs Unsalted nuts or peanut butter  Desserts and Snacks Apple (with peanut butter); baked apple with sugar and butter             Granola bars Pudding made with reduced-fat (2%) milk or milk powder Smoothies  Custard Chocolate syrup added to milk or ice cream  Fats Tub or liquid margarine (trans fat-free) Butter (check with your doctor or RDN first) Unsaturated fat oils (canola, olive, corn, sunflower, safflower, peanut, vegetable, and soybean) Flaxseed (oil or ground)  Sodium-Free Condiments Fresh or dried herbs; pepper; vinegar; lemon juice or lime juice; salt-free seasoning mixes and marinades such as Mrs. Dash or McCormick's salt-free  blend; simple salad dressings such as vinegar and oil; low-sodium ketchup. You can purchase salt-free barbecue sauce and many others on the Internet.  Ask your RDN.     Foods Not Recommended Food Group Foods Not Recommended  Grains Breads or crackers topped with salt Cereals (hot/cold) with more than 300 milligrams sodium per serving Biscuits, cornbread, and other "quick" breads prepared with baking soda Prepackaged bread crumbs Self-rising flours  Vegetables Canned vegetables (unless they are salt free or low sodium) Frozen vegetables with high-sodium seasoning and sauces Sauerkraut and pickled vegetables Canned or dried soups (unless they are salt free or low  sodium) [place] fries and onion rings Broth-based soups  Fruits Dried fruits preserved with sodium-containing additives  Dairy (Milk and Milk Products) Buttermilk Processed cheeses such as Cheese Wiz, Velveeta, and Queso Feta cheese Shredded cheese (has more sodium than blocked cheeses) "Singles" cheese slices and string cheese  Protein Foods (Meat, Poultry, Fish, and Beans) Cured meats (bacon, ham, sausage, pepperoni, and hot dogs)  Canned meats (chili, Vienna sausage, sardines, and Spam) Smoked fish and meats Frozen meals with more than 600 milligrams of sodium Egg Beaters   Fats Salted butter or margarine  Condiments Salt, sea salt, kosher salt, onion salt, and garlic salt Seasoning mixes containing salt such as Lemon Pepper or Lawry's Bouillon cubes Catsup or ketchup Barbecue sauce and Worcestershire sauce Soy sauce Salsa, pickles, olives, relish  Salad dressings: ranch, blue cheese, [place], and [place]  Alcohol Check with your doctor.   Heart Failure (Undernourished) Sample 1-Day Menu View Nutrient Info Breakfast 1 cup oatmeal made with milk 1 tablespoon wheat germ (sprinkle on oatmeal) 1 cup (240 milliliters) reduced-fat (2%) milk 2 tablespoons chocolate syrup (add to milk) 1 banana 2 tablespoons peanut butter  (for banana)  Morning Snack 1/4 cup raw almonds Cranberries (add to almonds) 1 cup (240 milliliters) oral nutritional supplement  Lunch 3 ounces grilled chicken breast 1 small potato 2 ounces cheese (to add to potato) 2 tablespoons margarine (to add to potato) Croutons (add to salad) Walnuts (add to salad) Vinegar and oil dressing (add to salad) 1 cup (240 milliliters) juice  Afternoon Snack 10 tortilla chips Guacamole (avocado, lime juice, onion, and tomatoes, and pepper) 1 cup (240 milliliters) water or juice  Evening Meal 3 ounces herb-baked fish 1 cup homemade mashed potatoes with margarine (trans fat-free) and milk 1/2 cup creamed spinach 1 cup (240 milliliters) water or juice  Evening Snack 1 cup (240 milliliters) homemade milkshake 1 slice low-sodium malawi breast 1 slice whole wheat bread 1 slice cheese Mayonnaise  Daily Sum Nutrient Unit Value  Macronutrients  Energy kcal 3461  Energy kJ 14480  Protein g 151  Total lipid (fat) g 158  Carbohydrate, by difference g 381  Fiber, total dietary g 36  Sugars, total g 163  Minerals  Calcium , Ca mg 2598  Iron, Fe mg 32  Sodium, Na mg 2530  Vitamins  Vitamin C, total ascorbic acid mg 180  Vitamin A, IU IU 9009  Vitamin D IU 575  Lipids  Fatty acids, total saturated g 43  Fatty acids, total monounsaturated g 56  Fatty acids, total polyunsaturated g 48  Cholesterol mg 252      Copyright 2020  Academy of Nutrition and Dietetics. All rights reserved

## 2024-01-28 NOTE — H&P (Signed)
 History and Physical    [name] FMW:[id number] DOB: [birth year] DOA: 01/27/2024  PCP: Patient, No Pcp Per   Patient coming from: Home   Chief Complaint:  Chief Complaint  Patient presents with   Chest Pain   ED TRIAGE note:  PER EMS: pt reports shortness of breath and constant chest pressure onset tonight around 8pm. Hx of MI. EMS adm  324 aspirin  en route.    BP- 200/140, HR-76, 97% RA, RR-18,            HPI:  [name] is a 58 y.o. 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  EMS gave aspirin .  EMS also found patient was hypertensive otherwise hemodynamically stable.  While in the emergency department patient does not have any chest pain however he is complaining about shortness of breath. Patient is stating that he has been using a brownie that contains cocaine and marijuana and last use of this brownie 6 to 7 days ago.  Patient is stating that he has been taking home medications however unable to recall the specific name and dosing.  Unreliable historian and concern for medication noncompliance. Patient reported mid substernal chest pressure that started around midnight and shortness of breath.  Currently patient is chest pressure/chest pain-free.  No other complaint at this time.  ED Course:  At presentation to ED blood pressure was 179/140 otherwise hemodynamically stable. Elevated BNP above 4500. Elevated troponin 36 and pending second troponin. CBC unremarkable with stable H&H, WBC blood cell count.  BMP showing low bicarb 17, creatinine 1.47 and GFR 57 prerenal function at baseline.  EKG showed normal sinus rhythm with premature ventricular complex heart rate 78.   There is ST-T abnormality.  Left ventricular hypertrophy pattern.  Chest x-ray showing cardiomegaly with pulmonary vascular congestion.  In the ED patient received Nitropaste 50 mg and IV Lasix  80 mg for flash pulmonary edema.  Also BiPAP has been placed.  Hospitalist has been consulted for further evaluation management of acute on chronic exacerbation, elevated troponin secondary demand ischemia, and flash pulmonary edema in the setting of CHF exacerbation.   Significant labs in the ED: Lab Orders         Basic metabolic panel         CBC         Brain natriuretic peptide         Rapid urine drug screen (hospital performed)         Comprehensive metabolic panel         CBC         Hemoglobin A1c       Review of Systems:  Review of Systems  Constitutional:  Negative for chills, fever, malaise/fatigue and weight loss.  Respiratory:  Positive for shortness of breath. Negative for cough, hemoptysis, sputum production and wheezing.   Cardiovascular:  Positive for orthopnea and PND. Negative for chest pain, palpitations and leg swelling.  Gastrointestinal:  Negative for abdominal pain, constipation, diarrhea, heartburn, nausea and vomiting.  Neurological:  Negative for dizziness and headaches.  Psychiatric/Behavioral:  The patient is not nervous/anxious.     Past Medical History:  Diagnosis Date   Allergic rhinitis    Anxiety    Asthma    Bipolar disorder (  HCC)    Bradycardia    CAD (coronary artery disease)    CVA (cerebral vascular accident) (HCC)    GERD (gastroesophageal reflux disease)    Headache    Hepatitis C    History of heart artery stent    History of myocardial infarction    History of stroke    Hypercholesterolemia    Hypertension    MI (myocardial infarction) (HCC)    Stroke [facility])     Past Surgical History:  Procedure Laterality Date   CORONARY STENT PLACEMENT     x2 stents      reports that he has been smoking cigarettes. He has never used  smokeless tobacco. He reports that he does not drink alcohol and does not use drugs.  Allergies  Allergen Reactions   Ace Inhibitors Swelling   Penicillins Swelling    Throat and eye swelling Has patient had a PCN reaction causing immediate rash, facial/tongue/throat swelling, SOB or lightheadedness with hypotension: Yes Has patient had a PCN reaction causing severe rash involving mucus membranes or skin necrosis: No Has patient had a PCN reaction that required hospitalization: No Has patient had a PCN reaction occurring within the last 10 years: No If all of the above answers are NO, then may proceed with Cephalosporin use.    Family History  Problem Relation Age of Onset   Colon polyps Mother    Stroke Father    Heart disease Maternal Grandmother    Hearing loss Maternal Grandmother    Skin cancer Maternal Grandmother    Asthma Maternal Aunt    Emphysema Maternal Aunt    Arthritis Maternal Uncle    Colon cancer Neg Hx    Esophageal cancer Neg Hx    Rectal cancer Neg Hx    Stomach cancer Neg Hx     Prior to Admission medications   Medication Sig Start Date End Date Taking? Authorizing Provider  albuterol  (VENTOLIN  HFA) 108 (90 Base) MCG/ACT inhaler Inhale 2 puffs into the lungs every 4 (four) hours as needed for wheezing. 04/24/23   [provider]  aspirin  81 MG EC tablet Chew 81 mg by mouth daily.    [provider]  blood glucose meter kit and supplies Dispense based on patient and insurance preference. Use up to four times daily as directed. (FOR ICD-10 E10.9, E11.9). 07/22/23   Fairy Frames, MD  bumetanide  (BUMEX ) 1 MG tablet Take 2 tablets (2 mg total) by mouth daily. 07/23/23   Fairy Frames, MD  carvedilol  (COREG ) 12.5 MG tablet Take 3 tablets (37.5 mg total) by mouth 2 (two) times daily with a meal. 07/22/23   Fairy Frames, MD  empagliflozin  (JARDIANCE ) 10 MG TABS tablet Take 1 tablet (10 mg total) by mouth daily. 07/23/23   Fairy Frames, MD   finasteride  (PROSCAR ) 5 MG tablet Take 5 mg by mouth daily. 01/25/21   [provider]  nitroGLYCERIN  (NITROSTAT ) 0.4 MG SL tablet Place 1 tablet (0.4 mg total) under the tongue every 5 (five) minutes as needed. 03/19/21 06/17/21  Revankar, Jennifer SAUNDERS, MD  rosuvastatin  (CRESTOR ) 40 MG tablet Take 1 tablet (40 mg total) by mouth daily. 07/23/23   Fairy Frames, MD  spironolactone  (ALDACTONE ) 25 MG tablet Take 0.5 tablets (12.5 mg total) by mouth daily. 07/23/23   Fairy Frames, MD  tamsulosin  (FLOMAX ) 0.4 MG CAPS capsule Take 0.4 mg by mouth daily. 04/09/17   [provider]  tiZANidine (ZANAFLEX) 4 MG tablet Take 4 mg by mouth 3 (three)  times daily as needed for muscle spasms. 01/17/21   [provider]     Physical Exam: Vitals:   01/27/24 2314 01/27/24 2315  BP:  (!) 179/140  Pulse:  79  Resp:  19  Temp:  98.3 F (36.8 C)  TempSrc:  Oral  SpO2:  99%  Weight: 75.8 kg   Height: 6' 2 (1.88 m)     Physical Exam Vitals and nursing note reviewed.  Constitutional:      General: He is not in acute distress.    Appearance: He is ill-appearing.  Eyes:     Pupils: Pupils are equal, round, and reactive to light.  Cardiovascular:     Rate and Rhythm: Normal rate and regular rhythm.     Heart sounds: Normal heart sounds.  Pulmonary:     Effort: Pulmonary effort is normal. No tachypnea.     Breath sounds: Normal breath sounds. No decreased breath sounds, wheezing, rhonchi or rales.  Musculoskeletal:     Right lower leg: No edema.     Left lower leg: No edema.  Skin:    Capillary Refill: Capillary refill takes less than 2 seconds.  Neurological:     Mental Status: He is alert and oriented to person, place, and time.  Psychiatric:        Mood and Affect: Mood normal.      Labs on Admission: I have personally reviewed following labs and imaging studies  CBC: Recent Labs  Lab 01/27/24 2319 01/28/24 0224  WBC 7.3 7.0  HGB 12.2* 12.7*  HCT 37.3* 40.2  MCV  93.3 97.3  PLT 256 250   Basic Metabolic Panel: Recent Labs  Lab 01/27/24 2319 01/28/24 0224  NA 136 137  K 3.6 4.1  CL 111 108  CO2 17* 20*  GLUCOSE 109* 191*  BUN 26* 25*  CREATININE 1.42* 1.52*  CALCIUM  8.6* 8.6*   GFR: Estimated Creatinine Clearance: 56.8 mL/min (A) (by C-G formula based on SCr of 1.52 mg/dL (H)). Liver Function Tests: Recent Labs  Lab 01/28/24 0224  AST 23  ALT 16  ALKPHOS 77  BILITOT 1.2  PROT 6.6  ALBUMIN 3.0*   No results for input(s): LIPASE, AMYLASE in the last 168 hours. No results for input(s): AMMONIA in the last 168 hours. Coagulation Profile: No results for input(s): INR, PROTIME in the last 168 hours. Cardiac Enzymes: Recent Labs  Lab 01/27/24 2319 01/28/24 0224  TROPONINIHS 36* 35*   BNP (last 3 results) Recent Labs    07/10/23 1549 01/27/24 2319  BNP >4,500.0* >4,500.0*   HbA1C: Recent Labs    01/28/24 0224  HGBA1C 7.1*   CBG: No results for input(s): GLUCAP in the last 168 hours. Lipid Profile: No results for input(s): CHOL, HDL, LDLCALC, TRIG, CHOLHDL, LDLDIRECT in the last 72 hours. Thyroid Function Tests: No results for input(s): TSH, T4TOTAL, FREET4, T3FREE, THYROIDAB in the last 72 hours. Anemia Panel: No results for input(s): VITAMINB12, FOLATE, FERRITIN, TIBC, IRON, RETICCTPCT in the last 72 hours. Urine analysis:    Component Value Date/Time   COLORURINE YELLOW 07/10/2023 1555   APPEARANCEUR CLEAR 07/10/2023 1555   LABSPEC 1.014 07/10/2023 1555   PHURINE 5.0 07/10/2023 1555   GLUCOSEU NEGATIVE 07/10/2023 1555   HGBUR SMALL (A) 07/10/2023 1555   BILIRUBINUR NEGATIVE 07/10/2023 1555   KETONESUR NEGATIVE 07/10/2023 1555   PROTEINUR 100 (A) 07/10/2023 1555   UROBILINOGEN 1.0 03/02/2010 2212   NITRITE NEGATIVE 07/10/2023 1555   LEUKOCYTESUR NEGATIVE 07/10/2023 1555  Radiological Exams on Admission: I have personally reviewed images DG Chest 2  View Result Date: 01/27/2024 CLINICAL DATA:  Chest pain with shortness of breath EXAM: CHEST - 2 VIEW COMPARISON:  chest x-ray 07/10/2023 FINDINGS: The heart is enlarged. There is mild central pulmonary vascular congestion. There is no focal lung infiltrate, pleural effusion or pneumothorax. No acute fractures are seen. IMPRESSION: Cardiomegaly with mild central pulmonary vascular congestion. Electronically Signed   By: Greig Pique M.D.   On: 01/27/2024 23:39     EKG: My personal interpretation of EKG shows:  EKG showed normal sinus rhythm with premature ventricular complex heart rate 78.  There is ST-T abnormality.  Left ventricular hypertrophy pattern.  Assessment/Plan: Principal Problem:   Flash pulmonary edema (HCC) Active Problems:   Acute on chronic combined systolic and diastolic CHF (congestive heart failure) (HCC)   Elevated troponin   Essential hypertension   Non-insulin  dependent type 2 diabetes mellitus (HCC)   Continuous dependence on cigarette smoking   History of CVA (cerebrovascular accident)   History of CAD (coronary artery disease)   History of cocaine use   CKD (chronic kidney disease) stage 3, GFR 30-59 ml/min (HCC)   Cocaine use   Marijuana use    Assessment and Plan: Flash pulmonary edema Acute on chronic CHF exacerbation History of HFrEF 30 to 35% -Patient presented emergency department sudden onset of shortness of breath and chest pressure that started around 8 PM.  Patient reported that he has been compliant with home medications still developed shortness of breath.  EMS gave aspirin  324 mg without any improvement of chest pressure sensation however at presentation to ED chest pressure completely resolved.  EMS found systolic blood pressure above 799 range and in the ED does around upper 190s range.  Was placed on BiPAP at presentation. -Lab work Elevated BNP above 4500. Elevated troponin 36 and pending second troponin. CBC unremarkable with stable H&H, WBC  blood cell count.  BMP showing low bicarb 17, creatinine 1.47 and GFR 57 prerenal function at baseline. - EKG showed normal sinus rhythm with premature ventricular complex heart rate 78.  There is ST-T abnormality.  Left ventricular hypertrophy pattern. - Chest x-ray showing cardiomegaly with pulmonary vascular congestion. - In the ED patient received Nitropaste 50 mg and IV Lasix  80 mg for flash pulmonary edema.  Also BiPAP has been placed. -Plan to continue IV Lasix  80 mg twice daily. -Continue Aldactone , Jardiance .  As patient continues to use cocaine holding Coreg . Strict I's/O, daily weight and monitor urine output - Continue BiPAP for next 4 to 6 hours.   Chest pressure-resolved Elevated troponin secondary demand ischemia -Patient was complaining about chest pressure which has been improved in the emergency department still complaining about shortness of breath.  Currently on BiPAP. - Flat troponin 36 and 35.   - EKG EKG showed normal sinus rhythm with premature ventricular complex.  There is no ST to abnormality. - Chest pressure and elevated troponin in the context of flash pulmonary edema/suspected starvation.  At this time there is no concern for acute coronary syndrome.    Non-insulin -dependent DM type II -Continue Jardiance  and sliding scale insulin  with mealtime coverage.  History of CAD -Continue aspirin  and Crestor ..  Holding Coreg  in the setting of cocaine use.   History of CVA -Continue aspirin  and Crestor .   CKD stage IIIb -Creatinine 1.42 and 457.  Renal function at baseline.  History of chronic cocaine use Marijuana use -Patient reported using marijuana and cocaine last use  6 to 7 days ago. - Counseled patient at bedside for medication compliance and abstinence from recreational drug.  Patient is not very receptive  regarding any advice.  Smoking cigarette -Patient reported he continues to smoke cigarettes and smoking marijuana as well.  Also he has been using a  brownie which contains marijuana and cocaine. - Unable to start nicotine  patch in the setting of chest pressure on initial presentation as it will exacerbate coronary vasospasm. -Counseled at bedside for smoking cessation.  Patient is not very receptive   DVT prophylaxis:  Lovenox  Code Status:  Full Code Diet: Heart healthy carb modified diet Family Communication:   Family was present at bedside, at the time of interview. Opportunity was given to ask question and all questions were answered satisfactorily.  Disposition Plan: Continue monitor improvement of blood pressure, monitor urine output and pending echocardiogram. Consults: Heart failure team and transition care team Admission status:   Inpatient, Step Down Unit  Severity of Illness: The appropriate patient status for this patient is INPATIENT. Inpatient status is judged to be reasonable and necessary in order to provide the required intensity of service to ensure the patient's safety. The patient's presenting symptoms, physical exam findings, and initial radiographic and laboratory data in the context of their chronic comorbidities is felt to place them at high risk for further clinical deterioration. Furthermore, it is not anticipated that the patient will be medically stable for discharge from the hospital within 2 midnights of admission.   * I certify that at the point of admission it is my clinical judgment that the patient will require inpatient hospital care spanning beyond 2 midnights from the point of admission due to high intensity of service, high risk for further deterioration and high frequency of surveillance required.[name]    Talyn Dessert, MD [facility]  How to contact the [facility] Attending or Consulting provider 7A - 7P or covering provider during after hours 7P -7A, for this patient.  Check the care team in [facility] and look for a) attending/consulting [facility] provider listed and b) the [facility] team listed Log into [email or web address]  and use [facility]'s universal password to access. If you do not have the password, please contact the hospital operator. Locate the [facility] provider you are looking for under [facility] and page to a number that you can be directly reached. If you still have difficulty reaching the provider, please page the [facility] (Director on Call) for the Hospitalists listed on amion for assistance.  01/28/2024, 3:52 AM

## 2024-01-28 NOTE — ED Provider Notes (Signed)
 Emergency Department Provider Note   I have reviewed the triage vital signs and the nursing notes.   HISTORY  Chief Complaint Chest Pain   HPI [name] is a 58 y.o. male with PMH of CAD, HLD, and HFrEF presents to the emergency department for evaluation of shortness of breath and chest pain.  Symptoms began 3 hours prior to ED arrival.  He states he was lying in bed, flat, when he felt like his lungs filled up with fluid.  He states he could not breathe and had to sit bolt upright.  He had heaviness in his chest and called EMS.  They gave aspirin  prior to arrival.  No active chest discomfort at this time.  His shortness of breath has improved slightly.  He states that he had significant leg swelling several weeks ago but has come down by taking his home Bumex .    Past Medical History:  Diagnosis Date   Allergic rhinitis    Anxiety    Asthma    Bipolar disorder (HCC)    Bradycardia    CAD (coronary artery disease)    CVA (cerebral vascular accident) (HCC)    GERD (gastroesophageal reflux disease)    Headache    Hepatitis C    History of heart artery stent    History of myocardial infarction    History of stroke    Hypercholesterolemia    Hypertension    MI (myocardial infarction) (HCC)    Stroke (HCC)     Review of Systems  Constitutional: No fever/chills Cardiovascular: Positive chest pain. Respiratory: Positive shortness of breath. Gastrointestinal: No abdominal pain.  No nausea, no vomiting.  Skin: Negative for rash. Neurological: Negative for headaches.    ____________________________________________   PHYSICAL EXAM:  VITAL SIGNS: ED Triage Vitals  Encounter Vitals Group     BP 01/27/24 2315 (!) 179/140     Pulse Rate 01/27/24 2315 79     Resp 01/27/24 2315 19     Temp 01/27/24 2315 98.3 F (36.8 C)     Temp Source 01/27/24 2315 Oral     SpO2 01/27/24 2315 99 %     Weight 01/27/24 2314 167 lb (75.8 kg)     Height 01/27/24 2314 6' 2 (1.88  m)   Constitutional: Alert and oriented. Well appearing and in no acute distress. Eyes: Conjunctivae are normal.  Head: Atraumatic. Nose: No congestion/rhinnorhea. Mouth/Throat: Mucous membranes are moist.  Neck: No stridor.  Cardiovascular: Normal rate, regular rhythm. Good peripheral circulation. Grossly normal heart sounds.   Respiratory: Normal respiratory effort.  No retractions. Lungs CTAB. Gastrointestinal: Soft and nontender. No distention.  Musculoskeletal: No gross deformities of extremities. Neurologic:  Normal speech and language.  Skin:  Skin is warm, dry and intact. No rash noted.  ____________________________________________   LABS (all labs ordered are listed, but only abnormal results are displayed)  Labs Reviewed  BASIC METABOLIC PANEL WITH GFR - Abnormal; Notable for the following components:      Result Value   CO2 17 (*)    Glucose, Bld 109 (*)    BUN 26 (*)    Creatinine, Ser 1.42 (*)    Calcium  8.6 (*)    GFR, Estimated 57 (*)    All other components within normal limits  CBC - Abnormal; Notable for the following components:   RBC 4.00 (*)    Hemoglobin 12.2 (*)    HCT 37.3 (*)    All other components within normal limits  BRAIN  NATRIURETIC PEPTIDE - Abnormal; Notable for the following components:   B Natriuretic Peptide >4,500.0 (*)    All other components within normal limits  COMPREHENSIVE METABOLIC PANEL WITH GFR - Abnormal; Notable for the following components:   CO2 20 (*)    Glucose, Bld 191 (*)    BUN 25 (*)    Creatinine, Ser 1.52 (*)    Calcium  8.6 (*)    Albumin 3.0 (*)    GFR, Estimated 53 (*)    All other components within normal limits  CBC - Abnormal; Notable for the following components:   RBC 4.13 (*)    Hemoglobin 12.7 (*)    All other components within normal limits  HEMOGLOBIN A1C - Abnormal; Notable for the following components:   Hgb A1c MFr Bld 7.1 (*)    All other components within normal limits  TROPONIN I (HIGH  SENSITIVITY) - Abnormal; Notable for the following components:   Troponin I (High Sensitivity) 36 (*)    All other components within normal limits  TROPONIN I (HIGH SENSITIVITY) - Abnormal; Notable for the following components:   Troponin I (High Sensitivity) 35 (*)    All other components within normal limits  RAPID URINE DRUG SCREEN, HOSP PERFORMED   ____________________________________________  EKG   EKG Interpretation Date/Time:  Thursday January 27 2024 23:19:55 EDT Ventricular Rate:  78 PR Interval:  136 QRS Duration:  106 QT Interval:  380 QTC Calculation: 433 R Axis:   58  Text Interpretation: Sinus rhythm with occasional Premature ventricular complexes Left ventricular hypertrophy with repolarization abnormality ( Sokolow-Lyon , Cornell product , Romhilt-Estes ) Abnormal ECG When compared with ECG of 10-Jul-2023 20:21, PREVIOUS ECG IS PRESENT Similar to January 2025 tracing Confirmed by Darra Chew [phone]) on 01/28/2024 12:25:33 AM        ____________________________________________  RADIOLOGY  DG Chest 2 View Result Date: 01/27/2024 CLINICAL DATA:  Chest pain with shortness of breath EXAM: CHEST - 2 VIEW COMPARISON:  chest x-ray 07/10/2023 FINDINGS: The heart is enlarged. There is mild central pulmonary vascular congestion. There is no focal lung infiltrate, pleural effusion or pneumothorax. No acute fractures are seen. IMPRESSION: Cardiomegaly with mild central pulmonary vascular congestion. Electronically Signed   By: Greig Pique M.D.   On: 01/27/2024 23:39    ____________________________________________   PROCEDURES  Procedure(s) performed:   Procedures  CRITICAL CARE Performed by: Chew KANDICE Darra Total critical care time: 35 minutes Critical care time was exclusive of separately billable procedures and treating other patients. Critical care was necessary to treat or prevent imminent or life-threatening deterioration. Critical care was time spent  personally by me on the following activities: development of treatment plan with patient and/or surrogate as well as nursing, discussions with consultants, evaluation of patient's response to treatment, examination of patient, obtaining history from patient or surrogate, ordering and performing treatments and interventions, ordering and review of laboratory studies, ordering and review of radiographic studies, pulse oximetry and re-evaluation of patient's condition.  Chew Darra, MD Emergency Medicine  ____________________________________________   INITIAL IMPRESSION / ASSESSMENT AND PLAN / ED COURSE  Pertinent labs & imaging results that were available during my care of the patient were reviewed by me and considered in my medical decision making (see chart for details).   This patient is Presenting for Evaluation of SOB/CP, which does require a range of treatment options, and is a complaint that involves a high risk of morbidity and mortality.  The Differential Diagnoses includes but is not exclusive  to acute coronary syndrome, aortic dissection, pulmonary embolism, cardiac tamponade, community-acquired pneumonia, pericarditis, musculoskeletal chest wall pain, etc.   Critical Interventions-    Medications  enoxaparin  (LOVENOX ) injection 40 mg (40 mg Subcutaneous Given 01/28/24 0139)  sodium chloride  flush (NS) 0.9 % injection 3 mL (3 mLs Intravenous Given 01/28/24 0139)  sodium chloride  flush (NS) 0.9 % injection 3 mL (has no administration in time range)  0.9 %  sodium chloride  infusion (has no administration in time range)  acetaminophen  (TYLENOL ) tablet 650 mg (has no administration in time range)    Or  acetaminophen  (TYLENOL ) suppository 650 mg (has no administration in time range)  ondansetron  (ZOFRAN ) tablet 4 mg (has no administration in time range)    Or  ondansetron  (ZOFRAN ) injection 4 mg (has no administration in time range)  furosemide  (LASIX ) injection 80 mg (has no  administration in time range)  albuterol  (PROVENTIL ) (2.5 MG/3ML) 0.083% nebulizer solution 2.5 mg (has no administration in time range)  aspirin  EC tablet 81 mg (has no administration in time range)  empagliflozin  (JARDIANCE ) tablet 10 mg (has no administration in time range)  finasteride  (PROSCAR ) tablet 5 mg (has no administration in time range)  spironolactone  (ALDACTONE ) tablet 12.5 mg (has no administration in time range)  tamsulosin  (FLOMAX ) capsule 0.4 mg (has no administration in time range)  insulin  aspart (novoLOG ) injection 0-6 Units (has no administration in time range)  rosuvastatin  (CRESTOR ) tablet 40 mg (has no administration in time range)  hydrALAZINE  (APRESOLINE ) injection 20 mg (has no administration in time range)  furosemide  (LASIX ) injection 80 mg (80 mg Intravenous Given 01/28/24 0053)  nitroGLYCERIN  (NITROGLYN) 2 % ointment 1 inch (1 inch Topical Given 01/28/24 0053)    Reassessment after intervention:  symptoms improved.    I did obtain Additional Historical Information from EMS.    Clinical Laboratory Tests Ordered, included BNP greater than 4500.  Troponin minimally elevated at 36.  Mild anemia to 12.2.  No AKI.  Radiologic Tests Ordered, included CXR. I independently interpreted the images and agree with radiology interpretation.   Cardiac Monitor Tracing which shows NSR with PVCs.    Social Determinants of Health Risk patient is a smoker.    Consult complete with [facility], Dr. Sundil. Plan for admit.   Medical Decision Making: Summary:  Patient presents emergency department with shortness of breath and chest pain.  Chest pain has resolved.  Does have evidence of volume overload on chest x-ray and BNP.  Plan for IV diuresis and monitoring.  Seems that patient may have had an episode of flash edema earlier but symptoms have largely improved at this point.   Reevaluation with update and discussion with patient. Feeling well at rest. Will admit. He is in agreement.    Patient's presentation is most consistent with acute presentation with potential threat to life or bodily function.   Disposition: admit  ____________________________________________  FINAL CLINICAL IMPRESSION(S) / ED DIAGNOSES  Final diagnoses:  Precordial chest pain  SOB (shortness of breath)    Note:  This document was prepared using Dragon voice recognition software and may include unintentional dictation errors.  Fonda Law, MD, [facility] Emergency Medicine    Librada Castronovo, Fonda MATSU, MD 01/28/24 [phone]

## 2024-01-28 NOTE — Progress Notes (Signed)
 1649: Patient arrived to unit from [facility] ED. Ambulated to room bed from stretcher. Admission process completed, pt questions answered. Pt situated in bed with personal belongings, room phone, urinal and call bell within reach.

## 2024-01-28 NOTE — Progress Notes (Signed)
 PROGRESS NOTE    [name]  FMW:[id number] DOB: July 21, 1965 DOA: 01/27/2024 PCP: Patient, No Pcp Per  Outpatient Specialists:     Brief Narrative:  Patient is a 58 year old male with past medical history significant for coronary artery disease, CVA, hep C, stroke, hypertension, MI, asthma and combined congestive heart failure with EF of 30%.  Patient has continued to use cocaine.  According to the patient, he uses cocaine, marijuana and brown.  UDS was positive for cocaine.  Patient was admitted with CHF exacerbation.   Assessment & Plan:   Principal Problem:   Flash pulmonary edema (HCC) Active Problems:   History of CVA (cerebrovascular accident)   Essential hypertension   Continuous dependence on cigarette smoking   Non-insulin  dependent type 2 diabetes mellitus (HCC)   Acute on chronic combined systolic and diastolic CHF (congestive heart failure) (HCC)   History of CAD (coronary artery disease)   History of cocaine use   CKD (chronic kidney disease) stage 3, GFR 30-59 ml/min (HCC)   Elevated troponin   Cocaine use   Marijuana use   Flash pulmonary edema Acute on chronic CHF exacerbation History of HFrEF 30 to 35% -Patient presented emergency department sudden onset of shortness of breath and chest pressure that started around 8 PM.  Patient reported that he has been compliant with home medications still developed shortness of breath.  EMS gave aspirin  324 mg without any improvement of chest pressure sensation however at presentation to ED chest pressure completely resolved.  EMS found systolic blood pressure above 799 range and in the ED does around upper 190s range.  Was placed on BiPAP at presentation. -Lab work Elevated BNP above 4500. Elevated troponin 36 and pending second troponin. CBC unremarkable with stable H&H, WBC blood cell count.  BMP showing low bicarb 17, creatinine 1.47 and GFR 57 prerenal function at baseline. - EKG showed normal sinus rhythm with  premature ventricular complex heart rate 78.  There is ST-T abnormality.  Left ventricular hypertrophy pattern. - Chest x-ray showing cardiomegaly with pulmonary vascular congestion. - In the ED patient received Nitropaste 50 mg and IV Lasix  80 mg for flash pulmonary edema.  Also BiPAP has been placed. -Plan to continue IV Lasix  80 mg twice daily. -Continue Aldactone , Jardiance .  As patient continues to use cocaine holding Coreg . Strict I's/O, daily weight and monitor urine output - Continue BiPAP for next 4 to 6 hours. 01/28/2024: Patient continues to abuse illicit drugs.  Cocaine came back positive.  Continue IV Lasix , Aldactone  and Jardiance .  Weigh patient daily.  Strict I's and O's.  Counseled to quit illicit drug use.  Low threshold to consult the cardiology team.     Chest pressure-resolved Elevated troponin secondary demand ischemia -Patient was complaining about chest pressure which has been improved in the emergency department still complaining about shortness of breath.  Currently on BiPAP. - Flat troponin 36 and 35.   - EKG EKG showed normal sinus rhythm with premature ventricular complex.  There is no ST to abnormality. - Chest pressure and elevated troponin in the context of flash pulmonary edema/suspected starvation.  At this time there is no concern for acute coronary syndrome.     Non-insulin -dependent DM type II -Continue Jardiance  and sliding scale insulin  with mealtime coverage.   History of CAD -Continue aspirin  and Crestor ..  Holding Coreg  in the setting of cocaine use.   History of CVA -Continue aspirin  and Crestor .     CKD stage IIIa -Renal function at baseline.  History of chronic cocaine use Marijuana use -Patient reported using marijuana and cocaine last use 6 to 7 days ago. - Counseled patient at bedside for medication compliance and abstinence from recreational drug.  Patient is not very receptive  regarding any advice.   Smoking cigarette -Patient  reported he continues to smoke cigarettes and smoking marijuana as well.  Also he has been using a brownie which contains marijuana and cocaine. - Unable to start nicotine  patch in the setting of chest pressure on initial presentation as it will exacerbate coronary vasospasm. -Counseled at bedside for smoking cessation.  Patient is not very receptive     DVT prophylaxis: Subcutaneous Lovenox . Code Status: Full code. Family Communication:  Disposition Plan: Inpatient.   Consultants:  None. Low threshold to consult the cardiology team.  Procedures:  None.  Antimicrobials:  None   Subjective: Shortness of breath and swallowing are improving.  Objective: Vitals:   01/28/24 0630 01/28/24 0722 01/28/24 0915 01/28/24 1111  BP: (!) 146/90  (!) 150/99   Pulse: (!) 58  66   Resp: 15  19   Temp:  97.8 F (36.6 C)  98.3 F (36.8 C)  TempSrc:  Oral  Oral  SpO2: 96%  100%   Weight:      Height:        Intake/Output Summary (Last 24 hours) at 01/28/2024 1152 Last data filed at 01/28/2024 0915 Gross per 24 hour  Intake --  Output 1960 ml  Net -1960 ml   Filed Weights   01/27/24 2314  Weight: 75.8 kg    Examination:  General exam: Chronically ill looking.  Not in any distress. Respiratory system: Clear to auscultation.  Cardiovascular system: S1 & S2 heard Gastrointestinal system: Abdomen is soft and nontender. Central nervous system: Alert and oriented.  Extremities: Leg edema.  Data Reviewed: I have personally reviewed following labs and imaging studies  CBC: Recent Labs  Lab 01/27/24 2319 01/28/24 0224  WBC 7.3 7.0  HGB 12.2* 12.7*  HCT 37.3* 40.2  MCV 93.3 97.3  PLT 256 250   Basic Metabolic Panel: Recent Labs  Lab 01/27/24 2319 01/28/24 0224  NA 136 137  K 3.6 4.1  CL 111 108  CO2 17* 20*  GLUCOSE 109* 191*  BUN 26* 25*  CREATININE 1.42* 1.52*  CALCIUM  8.6* 8.6*   GFR: Estimated Creatinine Clearance: 56.8 mL/min (A) (by C-G formula based on  SCr of 1.52 mg/dL (H)). Liver Function Tests: Recent Labs  Lab 01/28/24 0224  AST 23  ALT 16  ALKPHOS 77  BILITOT 1.2  PROT 6.6  ALBUMIN 3.0*   No results for input(s): LIPASE, AMYLASE in the last 168 hours. No results for input(s): AMMONIA in the last 168 hours. Coagulation Profile: No results for input(s): INR, PROTIME in the last 168 hours. Cardiac Enzymes: No results for input(s): CKTOTAL, CKMB, CKMBINDEX, TROPONINI in the last 168 hours. BNP (last 3 results) Recent Labs    05/28/23 0850  PROBNP 24,067*   HbA1C: Recent Labs    01/28/24 0224  HGBA1C 7.1*   CBG: Recent Labs  Lab 01/28/24 0842  GLUCAP 133*   Lipid Profile: No results for input(s): CHOL, HDL, LDLCALC, TRIG, CHOLHDL, LDLDIRECT in the last 72 hours. Thyroid Function Tests: No results for input(s): TSH, T4TOTAL, FREET4, T3FREE, THYROIDAB in the last 72 hours. Anemia Panel: No results for input(s): VITAMINB12, FOLATE, FERRITIN, TIBC, IRON, RETICCTPCT in the last 72 hours. Urine analysis:    Component Value Date/Time   COLORURINE  YELLOW 07/10/2023 1555   APPEARANCEUR CLEAR 07/10/2023 1555   LABSPEC 1.014 07/10/2023 1555   PHURINE 5.0 07/10/2023 1555   GLUCOSEU NEGATIVE 07/10/2023 1555   HGBUR SMALL (A) 07/10/2023 1555   BILIRUBINUR NEGATIVE 07/10/2023 1555   KETONESUR NEGATIVE 07/10/2023 1555   PROTEINUR 100 (A) 07/10/2023 1555   UROBILINOGEN 1.0 03/02/2010 2212   NITRITE NEGATIVE 07/10/2023 1555   LEUKOCYTESUR NEGATIVE 07/10/2023 1555   Sepsis Labs: @LABRCNTIP (procalcitonin:4,lacticidven:4)  )No results found for this or any previous visit (from the past 240 hours).       Radiology Studies: DG Chest 2 View Result Date: 01/27/2024 CLINICAL DATA:  Chest pain with shortness of breath EXAM: CHEST - 2 VIEW COMPARISON:  chest x-ray 07/10/2023 FINDINGS: The heart is enlarged. There is mild central pulmonary vascular congestion. There is no  focal lung infiltrate, pleural effusion or pneumothorax. No acute fractures are seen. IMPRESSION: Cardiomegaly with mild central pulmonary vascular congestion. Electronically Signed   By: Greig Pique M.D.   On: 01/27/2024 23:39        Scheduled Meds:  aspirin  EC  81 mg Oral Daily   empagliflozin   10 mg Oral Daily   enoxaparin  (LOVENOX ) injection  40 mg Subcutaneous Q24H   finasteride   5 mg Oral Daily   furosemide   80 mg Intravenous BID   insulin  aspart  0-6 Units Subcutaneous TID WC   rosuvastatin   40 mg Oral Daily   sodium chloride  flush  3 mL Intravenous Q12H   spironolactone   12.5 mg Oral Daily   tamsulosin   0.4 mg Oral Daily   Continuous Infusions:  sodium chloride        LOS: 0 days    Time spent: 55 minutes.    Leatrice Chapel, MD  [facility] Pager #: [phone] 7PM-7AM contact night coverage as above

## 2024-01-29 ENCOUNTER — Inpatient Hospital Stay (HOSPITAL_COMMUNITY)

## 2024-01-29 DIAGNOSIS — I5041 Acute combined systolic (congestive) and diastolic (congestive) heart failure: Secondary | ICD-10-CM

## 2024-01-29 DIAGNOSIS — J810 Acute pulmonary edema: Secondary | ICD-10-CM | POA: Diagnosis not present

## 2024-01-29 LAB — ECHOCARDIOGRAM COMPLETE
AR max vel: 2.89 cm2
AV Peak grad: 7.1 mmHg
Ao pk vel: 1.33 m/s
Area-P 1/2: 3.76 cm2
Height: 74 in
S' Lateral: 4.8 cm
Weight: 2328.06 [oz_av]

## 2024-01-29 LAB — RENAL FUNCTION PANEL
Albumin: 3.2 g/dL — ABNORMAL LOW (ref 3.5–5.0)
Anion gap: 12 (ref 5–15)
BUN: 39 mg/dL — ABNORMAL HIGH (ref 6–20)
CO2: 23 mmol/L (ref 22–32)
Calcium: 8.7 mg/dL — ABNORMAL LOW (ref 8.9–10.3)
Chloride: 102 mmol/L (ref 98–111)
Creatinine, Ser: 1.83 mg/dL — ABNORMAL HIGH (ref 0.61–1.24)
GFR, Estimated: 42 mL/min — ABNORMAL LOW (ref >60)
Glucose, Bld: 115 mg/dL — ABNORMAL HIGH (ref 70–99)
Phosphorus: 3.9 mg/dL (ref 2.5–4.6)
Potassium: 3 mmol/L — ABNORMAL LOW (ref 3.5–5.1)
Sodium: 137 mmol/L (ref 135–145)

## 2024-01-29 LAB — CBC WITH DIFFERENTIAL/PLATELET
Abs Immature Granulocytes: 0.04 K/uL (ref 0.00–0.07)
Basophils Absolute: 0.1 K/uL (ref 0.0–0.1)
Basophils Relative: 1 %
Eosinophils Absolute: 0.4 K/uL (ref 0.0–0.5)
Eosinophils Relative: 6 %
HCT: 39.5 % (ref 39.0–52.0)
Hemoglobin: 13.3 g/dL (ref 13.0–17.0)
Immature Granulocytes: 1 %
Lymphocytes Relative: 18 %
Lymphs Abs: 1.3 K/uL (ref 0.7–4.0)
MCH: 30.8 pg (ref 26.0–34.0)
MCHC: 33.7 g/dL (ref 30.0–36.0)
MCV: 91.4 fL (ref 80.0–100.0)
Monocytes Absolute: 1.1 K/uL — ABNORMAL HIGH (ref 0.1–1.0)
Monocytes Relative: 16 %
Neutro Abs: 4 K/uL (ref 1.7–7.7)
Neutrophils Relative %: 58 %
Platelets: 254 K/uL (ref 150–400)
RBC: 4.32 MIL/uL (ref 4.22–5.81)
RDW: 15.2 % (ref 11.5–15.5)
WBC: 6.8 K/uL (ref 4.0–10.5)
nRBC: 0 % (ref 0.0–0.2)

## 2024-01-29 LAB — GLUCOSE, CAPILLARY
Glucose-Capillary: 96 mg/dL (ref 70–99)
Glucose-Capillary: 103 mg/dL — ABNORMAL HIGH (ref 70–99)
Glucose-Capillary: 130 mg/dL — ABNORMAL HIGH (ref 70–99)
Glucose-Capillary: 148 mg/dL — ABNORMAL HIGH (ref 70–99)

## 2024-01-29 LAB — MAGNESIUM: Magnesium: 1.8 mg/dL (ref 1.7–2.4)

## 2024-01-29 MED ORDER — BUMETANIDE 2 MG PO TABS
2.0000 mg | ORAL_TABLET | Freq: Every day | ORAL | Status: DC
  Administered 2024-01-30: 2 mg via ORAL
  Filled 2024-01-29: qty 1

## 2024-01-29 MED ORDER — PERFLUTREN LIPID MICROSPHERE
1.0000 mL | INTRAVENOUS | Status: AC | PRN
  Administered 2024-01-29: 1.5 mL via INTRAVENOUS

## 2024-01-29 MED ORDER — BUMETANIDE 2 MG PO TABS: 2.0000 mg | ORAL_TABLET | Freq: Every day | ORAL | Status: DC

## 2024-01-29 MED ORDER — POTASSIUM CHLORIDE CRYS ER 20 MEQ PO TBCR
40.0000 meq | EXTENDED_RELEASE_TABLET | Freq: Once | ORAL | Status: AC
  Administered 2024-01-29: 40 meq via ORAL
  Filled 2024-01-29: qty 2

## 2024-01-29 NOTE — Progress Notes (Signed)
 PROGRESS NOTE    ESAW KNIPPEL  FMW:[id number] DOB: 23-Dec-1965 DOA: 01/27/2024 PCP: Patient, No Pcp Per  Outpatient Specialists:     Brief Narrative:  Patient is a 58 year old male with past medical history significant for coronary artery disease, CVA, hep C, stroke, hypertension, MI, asthma and combined congestive heart failure with EF of 30%.  Patient has continued to use cocaine, marijuana and Browny.  UDS was positive for cocaine.  Patient was admitted with acute on chronic combined systolic and diastolic CHF exacerbation.  Echo revealed: 1. Left ventricular ejection fraction, by estimation, is 25 to 30%. Left  ventricular ejection fraction by 3D volume is 28 %. The left ventricle has  severely decreased function. The left ventricle demonstrates global  hypokinesis. The left ventricular  internal cavity size was mildly dilated. There is moderate concentric left  ventricular hypertrophy. Left ventricular diastolic parameters are  consistent with Grade I diastolic dysfunction (impaired relaxation).  Elevated left atrial pressure.   2. Right ventricular systolic function is mildly reduced. The right  ventricular size is normal.   3. Left atrial size was severely dilated.   4. Right atrial size was moderately dilated.   5. Mild mitral valve regurgitation.   6. The aortic valve is tricuspid. Aortic valve regurgitation is not  visualized. Aortic valve sclerosis/calcification is present, without any  evidence of aortic stenosis.   7. The inferior vena cava is normal in size with greater than 50%  respiratory variability, suggesting right atrial pressure of 3 mmHg.    01/29/2024: Echocardiogram done today revealed left ventricular estimated EF of 25 to 30% (see above documentation).  Patient is not keen on being discharged back home today.  Patient seems to be homeless.  Will consult TOC team.   Assessment & Plan:   Principal Problem:   Flash pulmonary edema (HCC) Active  Problems:   History of CVA (cerebrovascular accident)   Essential hypertension   Continuous dependence on cigarette smoking   Non-insulin  dependent type 2 diabetes mellitus (HCC)   Acute on chronic combined systolic and diastolic CHF (congestive heart failure) (HCC)   History of CAD (coronary artery disease)   History of cocaine use   CKD (chronic kidney disease) stage 3, GFR 30-59 ml/min (HCC)   Elevated troponin   Cocaine use   Marijuana use   Acute on chronic combined systolic and diastolic CHF:  - As per prior documentation: Patient presented emergency department sudden onset of shortness of breath and chest pressure that started around 8 PM.  Patient reported that he has been compliant with home medications still developed shortness of breath.  EMS gave aspirin  324 mg without any improvement of chest pressure sensation however at presentation to ED chest pressure completely resolved.  EMS found systolic blood pressure above 799 range and in the ED does around upper 190s range.  Was placed on BiPAP at presentation. - BNP was greater than 4500.  Patient has chronically elevated cardiac BNP. -Flat troponin (35 and 36).  . -Serum creatinine of 1.42 on presentation (down from 1.85).   - EKG revealed normal sinus rhythm with premature ventricular complex heart rate 78.  There is ST-T abnormality.  Left ventricular hypertrophy pattern. - Chest x-ray revealed cardiomegaly with mild central pulmonary vascular congestion. -Will discontinue IV Lasix  80 mg twice daily. -Start Bumex  2 mg p.o. once daily from tomorrow -Continue Aldactone  and Jardiance .  Patient is allergic to ACE inhibitor.  As patient continues to use cocaine.  -Strict I's/O. - Weigh  patient daily. - Monitor renal function and electrolytes closely.  - Patient is off the BiPAP.  Patient is on room air.     Chest pressure-resolved Elevated, but flat troponin:  - No chest pain.   Non-insulin -dependent DM type II -Continue  Jardiance  and sliding scale insulin  with mealtime coverage. -Patient is asking for double meal portion!   History of CAD -Continue aspirin  and Crestor ..  Holding Coreg  in the setting of cocaine use.    History of CVA -Continue aspirin  and Crestor .   CKD stage IIIa - Serum creatinine of 1.83 today. - Discontinue IV Lasix . - Start Bumex  2 mg p.o. once daily from tomorrow. - Continue to monitor renal function and electrolytes..   History of chronic cocaine use Marijuana use - Continuous use. - Patient reports he wants to quit illicit drug use.  Patient is asking for resources.  Consult social worker/TOC team.  Patient may be homeless.     Smoking cigarette - Counseled to quit.  DVT prophylaxis: Subcutaneous Lovenox . Code Status: Full code. Family Communication:  Disposition Plan: Inpatient.   Consultants:  None. Low threshold to consult the cardiology team.  Procedures:  None.  Antimicrobials:  None   Subjective: Shortness of breath has improved significantly.    Objective: Vitals:   01/29/24 0018 01/29/24 0427 01/29/24 0755 01/29/24 0756  BP: (!) 162/89 (!) 148/100  (!) 163/107  Pulse: 67 65    Resp: 19 18  20   Temp: 98.5 F (36.9 C) 98 F (36.7 C) 98 F (36.7 C) 98 F (36.7 C)  TempSrc: Oral Oral Oral Oral  SpO2: 100% 100%  100%  Weight:  66 kg    Height:        Intake/Output Summary (Last 24 hours) at 01/29/2024 0955 Last data filed at 01/29/2024 0730 Gross per 24 hour  Intake 720 ml  Output 4050 ml  Net -3330 ml   Filed Weights   01/27/24 2314 01/28/24 1649 01/29/24 0427  Weight: 75.8 kg 67.7 kg 66 kg    Examination:  General exam: Chronically ill looking.  Not in any distress. Respiratory system: Clear to auscultation.  Cardiovascular system: S1 & S2 heard Gastrointestinal system: Abdomen is soft and nontender. Central nervous system: Alert and oriented.  Extremities: Leg edema has improved significantly.  Data Reviewed: I have  personally reviewed following labs and imaging studies  CBC: Recent Labs  Lab 01/27/24 2319 01/28/24 0224 01/29/24 0244  WBC 7.3 7.0 6.8  NEUTROABS  --   --  4.0  HGB 12.2* 12.7* 13.3  HCT 37.3* 40.2 39.5  MCV 93.3 97.3 91.4  PLT 256 250 254   Basic Metabolic Panel: Recent Labs  Lab 01/27/24 2319 01/28/24 0224 01/29/24 0244  NA 136 137 137  K 3.6 4.1 3.0*  CL 111 108 102  CO2 17* 20* 23  GLUCOSE 109* 191* 115*  BUN 26* 25* 39*  CREATININE 1.42* 1.52* 1.83*  CALCIUM  8.6* 8.6* 8.7*  PHOS  --   --  3.9   GFR: Estimated Creatinine Clearance: 41.1 mL/min (A) (by C-G formula based on SCr of 1.83 mg/dL (H)). Liver Function Tests: Recent Labs  Lab 01/28/24 0224 01/29/24 0244  AST 23  --   ALT 16  --   ALKPHOS 77  --   BILITOT 1.2  --   PROT 6.6  --   ALBUMIN 3.0* 3.2*   No results for input(s): LIPASE, AMYLASE in the last 168 hours. No results for input(s): AMMONIA in the last  168 hours. Coagulation Profile: No results for input(s): INR, PROTIME in the last 168 hours. Cardiac Enzymes: No results for input(s): CKTOTAL, CKMB, CKMBINDEX, TROPONINI in the last 168 hours. BNP (last 3 results) Recent Labs    05/28/23 0850  PROBNP 24,067*   HbA1C: Recent Labs    01/28/24 0224  HGBA1C 7.1*   CBG: Recent Labs  Lab 01/28/24 0842 01/28/24 1222 01/28/24 1652 01/28/24 2135 01/29/24 0620  GLUCAP 133* 130* 99 150* 96   Lipid Profile: No results for input(s): CHOL, HDL, LDLCALC, TRIG, CHOLHDL, LDLDIRECT in the last 72 hours. Thyroid Function Tests: No results for input(s): TSH, T4TOTAL, FREET4, T3FREE, THYROIDAB in the last 72 hours. Anemia Panel: No results for input(s): VITAMINB12, FOLATE, FERRITIN, TIBC, IRON, RETICCTPCT in the last 72 hours. Urine analysis:    Component Value Date/Time   COLORURINE YELLOW 07/10/2023 1555   APPEARANCEUR CLEAR 07/10/2023 1555   LABSPEC 1.014 07/10/2023 1555   PHURINE  5.0 07/10/2023 1555   GLUCOSEU NEGATIVE 07/10/2023 1555   HGBUR SMALL (A) 07/10/2023 1555   BILIRUBINUR NEGATIVE 07/10/2023 1555   KETONESUR NEGATIVE 07/10/2023 1555   PROTEINUR 100 (A) 07/10/2023 1555   UROBILINOGEN 1.0 03/02/2010 2212   NITRITE NEGATIVE 07/10/2023 1555   LEUKOCYTESUR NEGATIVE 07/10/2023 1555   Sepsis Labs: @LABRCNTIP (procalcitonin:4,lacticidven:4)  )No results found for this or any previous visit (from the past 240 hours).       Radiology Studies: DG Chest 2 View Result Date: 01/27/2024 CLINICAL DATA:  Chest pain with shortness of breath EXAM: CHEST - 2 VIEW COMPARISON:  chest x-ray 07/10/2023 FINDINGS: The heart is enlarged. There is mild central pulmonary vascular congestion. There is no focal lung infiltrate, pleural effusion or pneumothorax. No acute fractures are seen. IMPRESSION: Cardiomegaly with mild central pulmonary vascular congestion. Electronically Signed   By: Greig Pique M.D.   On: 01/27/2024 23:39        Scheduled Meds:  aspirin  EC  81 mg Oral Daily   empagliflozin   10 mg Oral Daily   enoxaparin  (LOVENOX ) injection  40 mg Subcutaneous Q24H   finasteride   5 mg Oral Daily   furosemide   80 mg Intravenous BID   insulin  aspart  0-6 Units Subcutaneous TID WC   potassium chloride   40 mEq Oral Once   rosuvastatin   40 mg Oral Daily   sodium chloride  flush  3 mL Intravenous Q12H   spironolactone   12.5 mg Oral Daily   tamsulosin   0.4 mg Oral Daily   Continuous Infusions:     LOS: 1 day    Time spent: 55 minutes.    Leatrice Chapel, MD  [facility] Pager #: [phone] 7PM-7AM contact night coverage as above

## 2024-01-29 NOTE — Progress Notes (Signed)
 Echocardiogram 2D Echocardiogram has been performed.  Jaspal Pultz N Spurgeon Gancarz,RDCS 01/29/2024, 2:11 PM

## 2024-01-30 LAB — RENAL FUNCTION PANEL
Albumin: 3 g/dL — ABNORMAL LOW (ref 3.5–5.0)
Anion gap: 11 (ref 5–15)
BUN: 39 mg/dL — ABNORMAL HIGH (ref 6–20)
CO2: 23 mmol/L (ref 22–32)
Calcium: 9.2 mg/dL (ref 8.9–10.3)
Chloride: 103 mmol/L (ref 98–111)
Creatinine, Ser: 1.43 mg/dL — ABNORMAL HIGH (ref 0.61–1.24)
GFR, Estimated: 57 mL/min — ABNORMAL LOW (ref >60)
Glucose, Bld: 101 mg/dL — ABNORMAL HIGH (ref 70–99)
Phosphorus: 3.2 mg/dL (ref 2.5–4.6)
Potassium: 3.4 mmol/L — ABNORMAL LOW (ref 3.5–5.1)
Sodium: 137 mmol/L (ref 135–145)

## 2024-01-30 LAB — GLUCOSE, CAPILLARY
Glucose-Capillary: 111 mg/dL — ABNORMAL HIGH (ref 70–99)
Glucose-Capillary: 85 mg/dL (ref 70–99)

## 2024-01-30 LAB — MAGNESIUM: Magnesium: 2 mg/dL (ref 1.7–2.4)

## 2024-01-30 MED ORDER — CARVEDILOL 6.25 MG PO TABS: 6.2500 mg | ORAL_TABLET | Freq: Two times a day (BID) | ORAL | 0 refills | Status: DC

## 2024-01-30 MED ORDER — ENSURE PLUS HIGH PROTEIN PO LIQD: 237.0000 mL | Freq: Two times a day (BID) | ORAL | 0 refills | Status: AC

## 2024-01-30 MED ORDER — POTASSIUM CHLORIDE CRYS ER 20 MEQ PO TBCR: 40.0000 meq | EXTENDED_RELEASE_TABLET | Freq: Once | ORAL | Status: DC

## 2024-01-30 MED ORDER — ENSURE PLUS HIGH PROTEIN PO LIQD: 237.0000 mL | Freq: Two times a day (BID) | ORAL | Status: DC

## 2024-01-30 NOTE — Progress Notes (Signed)
 CSW attempted to address consult for Substance Use resources however patient left AMA.  SABRA

## 2024-01-30 NOTE — Progress Notes (Signed)
 Pt left AMA.  He stated that he will not sign any documents.  Security had to be called because pt became very verbally abusive to Dr Rosario as soon as he entered the room.  Dr Rosario tried to talk to the patent but he continued to be loud and demanded Dr Rosario get out.

## 2024-01-30 NOTE — Progress Notes (Signed)
 Initial Nutrition Assessment  DOCUMENTATION CODES:   Not applicable  INTERVENTION:  - Continue Heart Healthy/Carb Modified diet with fluid restriction per MD.  - Added Heart Failure Nutrition Therapy for the Undernourished to AVS. - Ensure Plus High Protein po BID, each supplement provides 350 kcal and 20 grams of protein. - Monitor weight trends.   NUTRITION DIAGNOSIS:   Increased nutrient needs related to chronic illness as evidenced by estimated needs.  GOAL:   Patient will meet greater than or equal to 90% of their needs  MONITOR:   PO intake, Supplement acceptance, Labs, Weight trends  REASON FOR ASSESSMENT:   Consult Assessment of nutrition requirement/status  ASSESSMENT:   58 y.o. male with PMH significant for CAD, DM 2, CVA, hep C, stroke, HTN, MI, asthma and combined CHF with EF of 30%. Patient also with ongoing drug use (cocaine, marijuana). Admitted with acute on chronic combined systolic and diastolic CHF exacerbation.  RD working remotely. Called patient via bedside telephone to obtain nutrition history but unable to reach patient.   Per chart review, possible weight loss over the past 6 months. Patient weighed at 167# in February and now weighed at 146#. This would be a 21# or 13% weight loss in 6.5 months, which would significant for the time frame.   Patient is documented to be consuming 100% of meals. Will add ONS to support oral intake. Patient has previously been accepting of Magic Cup, but for now will add Ensure for more kcal/protein given patient's significant weight loss.   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 Suspect this continues/has worsened but unable to verify at this time. RD will continue to follow patient.   Medications reviewed and include: Jardiance   Labs reviewed:  K+ 3.4 Creatinine 1.43 HA1C 7.1 (as of 8/15) Blood Glucose 96-148 x24 hours  NUTRITION - FOCUSED PHYSICAL EXAM:  RD working  remotely  Diet Order:   Diet Order             Diet heart healthy/carb modified Room service appropriate? Yes; Fluid consistency: Thin; Fluid restriction: 2000 mL Fluid  Diet effective now                   EDUCATION NEEDS:  Not appropriate for education at this time  Skin:  Skin Assessment: Reviewed RN Assessment  Last BM:  8/15  Height:  Ht Readings from Last 1 Encounters:  01/28/24 6' 2 (1.88 m)   Weight:  Wt Readings from Last 1 Encounters:  01/30/24 66.5 kg   Ideal Body Weight:  86.36 kg  BMI:  Body mass index is 18.82 kg/m.  Estimated Nutritional Needs:  Kcal:  2150-2350 kcals Protein:  100-115 grams Fluid:  per MD    Trude Ned RD, LDN Contact via Secure Chat.

## 2024-01-30 NOTE — Discharge Summary (Signed)
 Physician Discharge Summary  Patient ID: [name] MRN: [id number] DOB/AGE: 09-05-1965 58 y.o.  Admit date: 01/27/2024 Discharge date: 01/30/2024  Admission Diagnoses:  Discharge Diagnoses:  Principal Problem:   Flash pulmonary edema (HCC) Active Problems:   History of CVA (cerebrovascular accident)   Essential hypertension   Continuous dependence on cigarette smoking   Non-insulin  dependent type 2 diabetes mellitus (HCC)   Acute on chronic combined systolic and diastolic CHF (congestive heart failure) (HCC)   History of CAD (coronary artery disease)   History of cocaine use   CKD (chronic kidney disease) stage 3, GFR 30-59 ml/min (HCC)   Elevated troponin   Cocaine use   Marijuana use   Discharged Condition: stable  Hospital Course:  Patient is a 58 year old male with past medical history significant for coronary artery disease, CVA, hep C, stroke, hypertension, MI, asthma and combined congestive heart failure with EF of 30%.  Patient has continued to use cocaine, marijuana and Browny.  UDS was positive for cocaine.  Patient was admitted with acute on chronic combined systolic and diastolic CHF exacerbation.     Acute on chronic combined systolic and diastolic CHF:  - Patient presented to the emergency department with sudden onset of shortness of breath and chest pressure.   -Patient reported that he had been compliant with home medications.   -On presentation to the ED, the chest pressure had completely resolved.   -EMS found systolic blood pressure above 799 range and in the ED the systolic blood pressure remained around upper 190s range.   -Patient was placed on BiPAP on presentation. - BNP was greater than 4500.  Patient has chronically elevated cardiac BNP. -Flat troponin (35 and 36).  . -Serum creatinine of 1.42 on presentation.   - Chest x-ray revealed cardiomegaly with mild central pulmonary vascular congestion. - Patient was initially managed with IV Lasix  80  mg twice daily, and transitioned to Bumex  2 mg p.o. once daily. -Continue Aldactone  and Jardiance .  Patient is allergic to ACE inhibitor.   - Patient continues to use cocaine.  -Monitor weight at home - Monitor renal function and electrolytes closely.  - Patient is off the BiPAP.  Patient is currently on room air.  -Patient has been optimized.   Chest pressure-resolved Elevated, but flat troponin:     Non-insulin -dependent DM type II -Continue Jardiance  and sliding scale insulin  with mealtime coverage.   History of CAD -Continue aspirin  and Crestor ..   -Holding Coreg  in the setting of cocaine use.     History of CVA -Continue aspirin  and Crestor .   CKD stage IIIa -Stable . - Continue to monitor renal function and electrolytes..   History of chronic cocaine use Marijuana use - Continuous use. - Counseled     Smoking cigarette - Counseled to quit.  Consults: None  Significant Diagnostic Studies:  Echo revealed: 1. Left ventricular ejection fraction, by estimation, is 25 to 30%. Left  ventricular ejection fraction by 3D volume is 28 %. The left ventricle has  severely decreased function. The left ventricle demonstrates global  hypokinesis. The left ventricular  internal cavity size was mildly dilated. There is moderate concentric left  ventricular hypertrophy. Left ventricular diastolic parameters are  consistent with Grade I diastolic dysfunction (impaired relaxation).  Elevated left atrial pressure.   2. Right ventricular systolic function is mildly reduced. The right  ventricular size is normal.   3. Left atrial size was severely dilated.   4. Right atrial size was moderately dilated.  5. Mild mitral valve regurgitation.   6. The aortic valve is tricuspid. Aortic valve regurgitation is not  visualized. Aortic valve sclerosis/calcification is present, without any  evidence of aortic stenosis.   7. The inferior vena cava is normal in size with greater than 50%   respiratory variability, suggesting right atrial pressure of 3 mmHg.        Discharge Exam: Blood pressure (!) 134/90, pulse 79, temperature 98.6 F (37 C), temperature source Oral, resp. rate 20, height 6' 2 (1.88 m), weight 66.5 kg, SpO2 100%.   Disposition: Discharge disposition: 01-Home or Self Care       Discharge Instructions     Diet - low sodium heart healthy   Complete by: As directed    Increase activity slowly   Complete by: As directed       Allergies as of 01/30/2024       Reactions   Ace Inhibitors Swelling   Penicillins Swelling   Throat and eye swelling Has patient had a PCN reaction causing immediate rash, facial/tongue/throat swelling, SOB or lightheadedness with hypotension: Yes Has patient had a PCN reaction causing severe rash involving mucus membranes or skin necrosis: No Has patient had a PCN reaction that required hospitalization: No Has patient had a PCN reaction occurring within the last 10 years: No If all of the above answers are NO, then may proceed with Cephalosporin use.        Medication List     STOP taking these medications    bumetanide  1 MG tablet Commonly known as: BUMEX    tiZANidine 4 MG tablet Commonly known as: ZANAFLEX       TAKE these medications    albuterol  108 (90 Base) MCG/ACT inhaler Commonly known as: VENTOLIN  HFA Inhale 2 puffs into the lungs every 4 (four) hours as needed for wheezing.   aspirin  EC 81 MG tablet Chew 81 mg by mouth daily.   blood glucose meter kit and supplies Dispense based on patient and insurance preference. Use up to four times daily as directed. (FOR ICD-10 E10.9, E11.9).   carvedilol  6.25 MG tablet Commonly known as: COREG  Take 1 tablet (6.25 mg total) by mouth 2 (two) times daily with a meal. What changed:  medication strength how much to take   empagliflozin  10 MG Tabs tablet Commonly known as: JARDIANCE  Take 1 tablet (10 mg total) by mouth daily.   feeding  supplement Liqd Take 237 mLs by mouth 2 (two) times daily between meals. Start taking on: January 31, 2024   finasteride  5 MG tablet Commonly known as: PROSCAR  Take 5 mg by mouth daily.   furosemide  40 MG tablet Commonly known as: LASIX  Take 40 mg by mouth 2 (two) times daily.   hydrALAZINE  50 MG tablet Commonly known as: APRESOLINE  Take 50 mg by mouth 2 (two) times daily.   nitroGLYCERIN  0.4 MG SL tablet Commonly known as: NITROSTAT  Place 1 tablet (0.4 mg total) under the tongue every 5 (five) minutes as needed.   rosuvastatin  40 MG tablet Commonly known as: CRESTOR  Take 1 tablet (40 mg total) by mouth daily.   spironolactone  25 MG tablet Commonly known as: ALDACTONE  Take 0.5 tablets (12.5 mg total) by mouth daily.   tamsulosin  0.4 MG Caps capsule Commonly known as: FLOMAX  Take 0.4 mg by mouth daily.       Time spent: 35 minutes.  SignedBETHA Leatrice LILLETTE Rosario 01/30/2024, 3:35 PM

## 2024-01-30 NOTE — Plan of Care (Signed)
 Problem: Education: Goal: Ability to describe self-care measures that may prevent or decrease complications (Diabetes Survival Skills Education) will improve Outcome: Progressing Goal: Individualized Educational Video(s) Outcome: Progressing   Problem: Coping: Goal: Ability to adjust to condition or change in health will improve Outcome: Progressing   Problem: Fluid Volume: Goal: Ability to maintain a balanced intake and output will improve Outcome: Progressing   Problem: Health Behavior/Discharge Planning: Goal: Ability to identify and utilize available resources and services will improve Outcome: Progressing Goal: Ability to manage health-related needs will improve Outcome: Progressing   Problem: Metabolic: Goal: Ability to maintain appropriate glucose levels will improve Outcome: Progressing   Problem: Nutritional: Goal: Maintenance of adequate nutrition will improve Outcome: Progressing Goal: Progress toward achieving an optimal weight will improve Outcome: Progressing   Problem: Skin Integrity: Goal: Risk for impaired skin integrity will decrease Outcome: Progressing   Problem: Tissue Perfusion: Goal: Adequacy of tissue perfusion will improve Outcome: Progressing   Problem: Education: Goal: Knowledge of General Education information will improve Description: Including pain rating scale, medication(s)/side effects and non-pharmacologic comfort measures Outcome: Progressing   Problem: Health Behavior/Discharge Planning: Goal: Ability to manage health-related needs will improve Outcome: Progressing   Problem: Clinical Measurements: Goal: Ability to maintain clinical measurements within normal limits will improve Outcome: Progressing Goal: Will remain free from infection Outcome: Progressing Goal: Diagnostic test results will improve Outcome: Progressing Goal: Respiratory complications will improve Outcome: Progressing Goal: Cardiovascular complication will  be avoided Outcome: Progressing   Problem: Activity: Goal: Risk for activity intolerance will decrease Outcome: Progressing   Problem: Nutrition: Goal: Adequate nutrition will be maintained Outcome: Progressing   Problem: Coping: Goal: Level of anxiety will decrease Outcome: Progressing   Problem: Elimination: Goal: Will not experience complications related to bowel motility Outcome: Progressing Goal: Will not experience complications related to urinary retention Outcome: Progressing   Problem: Pain Managment: Goal: General experience of comfort will improve and/or be controlled Outcome: Progressing   Problem: Safety: Goal: Ability to remain free from injury will improve Outcome: Progressing   Problem: Skin Integrity: Goal: Risk for impaired skin integrity will decrease Outcome: Progressing   Problem: Education: Goal: Ability to demonstrate management of disease process will improve Outcome: Progressing Goal: Ability to verbalize understanding of medication therapies will improve Outcome: Progressing Goal: Individualized Educational Video(s) Outcome: Progressing   Problem: Activity: Goal: Capacity to carry out activities will improve Outcome: Progressing   Problem: Cardiac: Goal: Ability to achieve and maintain adequate cardiopulmonary perfusion will improve Outcome: Progressing   Problem: Education: Goal: Ability to describe self-care measures that may prevent or decrease complications (Diabetes Survival Skills Education) will improve Outcome: Progressing Goal: Individualized Educational Video(s) Outcome: Progressing   Problem: Coping: Goal: Ability to adjust to condition or change in health will improve Outcome: Progressing   Problem: Fluid Volume: Goal: Ability to maintain a balanced intake and output will improve Outcome: Progressing   Problem: Health Behavior/Discharge Planning: Goal: Ability to identify and utilize available resources and services  will improve Outcome: Progressing Goal: Ability to manage health-related needs will improve Outcome: Progressing   Problem: Metabolic: Goal: Ability to maintain appropriate glucose levels will improve Outcome: Progressing   Problem: Nutritional: Goal: Maintenance of adequate nutrition will improve Outcome: Progressing Goal: Progress toward achieving an optimal weight will improve Outcome: Progressing   Problem: Skin Integrity: Goal: Risk for impaired skin integrity will decrease Outcome: Progressing   Problem: Tissue Perfusion: Goal: Adequacy of tissue perfusion will improve Outcome: Progressing   Problem: Education: Goal: Knowledge of  General Education information will improve Description: Including pain rating scale, medication(s)/side effects and non-pharmacologic comfort measures Outcome: Progressing   Problem: Health Behavior/Discharge Planning: Goal: Ability to manage health-related needs will improve Outcome: Progressing   Problem: Clinical Measurements: Goal: Ability to maintain clinical measurements within normal limits will improve Outcome: Progressing Goal: Will remain free from infection Outcome: Progressing Goal: Diagnostic test results will improve Outcome: Progressing Goal: Respiratory complications will improve Outcome: Progressing Goal: Cardiovascular complication will be avoided Outcome: Progressing   Problem: Activity: Goal: Risk for activity intolerance will decrease Outcome: Progressing   Problem: Nutrition: Goal: Adequate nutrition will be maintained Outcome: Progressing   Problem: Coping: Goal: Level of anxiety will decrease Outcome: Progressing   Problem: Elimination: Goal: Will not experience complications related to bowel motility Outcome: Progressing Goal: Will not experience complications related to urinary retention Outcome: Progressing   Problem: Pain Managment: Goal: General experience of comfort will improve and/or be  controlled Outcome: Progressing   Problem: Safety: Goal: Ability to remain free from injury will improve Outcome: Progressing   Problem: Skin Integrity: Goal: Risk for impaired skin integrity will decrease Outcome: Progressing   Problem: Education: Goal: Ability to demonstrate management of disease process will improve Outcome: Progressing Goal: Ability to verbalize understanding of medication therapies will improve Outcome: Progressing Goal: Individualized Educational Video(s) Outcome: Progressing   Problem: Activity: Goal: Capacity to carry out activities will improve Outcome: Progressing   Problem: Cardiac: Goal: Ability to achieve and maintain adequate cardiopulmonary perfusion will improve Outcome: Progressing   Problem: Education: Goal: Ability to describe self-care measures that may prevent or decrease complications (Diabetes Survival Skills Education) will improve Outcome: Progressing Goal: Individualized Educational Video(s) Outcome: Progressing   Problem: Coping: Goal: Ability to adjust to condition or change in health will improve Outcome: Progressing   Problem: Fluid Volume: Goal: Ability to maintain a balanced intake and output will improve Outcome: Progressing   Problem: Health Behavior/Discharge Planning: Goal: Ability to identify and utilize available resources and services will improve Outcome: Progressing Goal: Ability to manage health-related needs will improve Outcome: Progressing   Problem: Metabolic: Goal: Ability to maintain appropriate glucose levels will improve Outcome: Progressing   Problem: Nutritional: Goal: Maintenance of adequate nutrition will improve Outcome: Progressing Goal: Progress toward achieving an optimal weight will improve Outcome: Progressing   Problem: Skin Integrity: Goal: Risk for impaired skin integrity will decrease Outcome: Progressing   Problem: Tissue Perfusion: Goal: Adequacy of tissue perfusion will  improve Outcome: Progressing   Problem: Education: Goal: Knowledge of General Education information will improve Description: Including pain rating scale, medication(s)/side effects and non-pharmacologic comfort measures Outcome: Progressing   Problem: Health Behavior/Discharge Planning: Goal: Ability to manage health-related needs will improve Outcome: Progressing   Problem: Clinical Measurements: Goal: Ability to maintain clinical measurements within normal limits will improve Outcome: Progressing Goal: Will remain free from infection Outcome: Progressing Goal: Diagnostic test results will improve Outcome: Progressing Goal: Respiratory complications will improve Outcome: Progressing Goal: Cardiovascular complication will be avoided Outcome: Progressing   Problem: Activity: Goal: Risk for activity intolerance will decrease Outcome: Progressing   Problem: Nutrition: Goal: Adequate nutrition will be maintained Outcome: Progressing   Problem: Coping: Goal: Level of anxiety will decrease Outcome: Progressing   Problem: Elimination: Goal: Will not experience complications related to bowel motility Outcome: Progressing Goal: Will not experience complications related to urinary retention Outcome: Progressing   Problem: Pain Managment: Goal: General experience of comfort will improve and/or be controlled Outcome: Progressing   Problem: Safety: Goal: Ability  to remain free from injury will improve Outcome: Progressing   Problem: Skin Integrity: Goal: Risk for impaired skin integrity will decrease Outcome: Progressing   Problem: Education: Goal: Ability to demonstrate management of disease process will improve Outcome: Progressing Goal: Ability to verbalize understanding of medication therapies will improve Outcome: Progressing Goal: Individualized Educational Video(s) Outcome: Progressing   Problem: Activity: Goal: Capacity to carry out activities will  improve Outcome: Progressing   Problem: Cardiac: Goal: Ability to achieve and maintain adequate cardiopulmonary perfusion will improve Outcome: Progressing   Problem: Education: Goal: Ability to describe self-care measures that may prevent or decrease complications (Diabetes Survival Skills Education) will improve Outcome: Progressing Goal: Individualized Educational Video(s) Outcome: Progressing   Problem: Coping: Goal: Ability to adjust to condition or change in health will improve Outcome: Progressing   Problem: Fluid Volume: Goal: Ability to maintain a balanced intake and output will improve Outcome: Progressing   Problem: Health Behavior/Discharge Planning: Goal: Ability to identify and utilize available resources and services will improve Outcome: Progressing Goal: Ability to manage health-related needs will improve Outcome: Progressing   Problem: Metabolic: Goal: Ability to maintain appropriate glucose levels will improve Outcome: Progressing   Problem: Nutritional: Goal: Maintenance of adequate nutrition will improve Outcome: Progressing Goal: Progress toward achieving an optimal weight will improve Outcome: Progressing   Problem: Skin Integrity: Goal: Risk for impaired skin integrity will decrease Outcome: Progressing   Problem: Tissue Perfusion: Goal: Adequacy of tissue perfusion will improve Outcome: Progressing   Problem: Education: Goal: Knowledge of General Education information will improve Description: Including pain rating scale, medication(s)/side effects and non-pharmacologic comfort measures Outcome: Progressing   Problem: Health Behavior/Discharge Planning: Goal: Ability to manage health-related needs will improve Outcome: Progressing   Problem: Clinical Measurements: Goal: Ability to maintain clinical measurements within normal limits will improve Outcome: Progressing Goal: Will remain free from infection Outcome: Progressing Goal:  Diagnostic test results will improve Outcome: Progressing Goal: Respiratory complications will improve Outcome: Progressing Goal: Cardiovascular complication will be avoided Outcome: Progressing   Problem: Activity: Goal: Risk for activity intolerance will decrease Outcome: Progressing   Problem: Nutrition: Goal: Adequate nutrition will be maintained Outcome: Progressing   Problem: Coping: Goal: Level of anxiety will decrease Outcome: Progressing   Problem: Elimination: Goal: Will not experience complications related to bowel motility Outcome: Progressing Goal: Will not experience complications related to urinary retention Outcome: Progressing   Problem: Pain Managment: Goal: General experience of comfort will improve and/or be controlled Outcome: Progressing   Problem: Safety: Goal: Ability to remain free from injury will improve Outcome: Progressing   Problem: Skin Integrity: Goal: Risk for impaired skin integrity will decrease Outcome: Progressing   Problem: Education: Goal: Ability to demonstrate management of disease process will improve Outcome: Progressing Goal: Ability to verbalize understanding of medication therapies will improve Outcome: Progressing Goal: Individualized Educational Video(s) Outcome: Progressing   Problem: Activity: Goal: Capacity to carry out activities will improve Outcome: Progressing   Problem: Cardiac: Goal: Ability to achieve and maintain adequate cardiopulmonary perfusion will improve Outcome: Progressing

## 2024-01-30 NOTE — Plan of Care (Signed)
   Problem: Coping: Goal: Ability to adjust to condition or change in health will improve Outcome: Progressing   Problem: Metabolic: Goal: Ability to maintain appropriate glucose levels will improve Outcome: Progressing   Problem: Nutritional: Goal: Maintenance of adequate nutrition will improve Outcome: Progressing

## 2024-02-06 NOTE — Discharge Summary (Incomplete)
 Physician Discharge Summary  Patient ID: [name] MRN: [id number] DOB/AGE: [birth year] 58 y.o.  Admit date: 01/27/2024 Discharge date: 01/30/2024  Admission Diagnoses:  Discharge Diagnoses:  Principal Problem:   Flash pulmonary edema (HCC) Active Problems:   History of CVA (cerebrovascular accident)   Essential hypertension   Continuous dependence on cigarette smoking   Non-insulin  dependent type 2 diabetes mellitus (HCC)   Acute on chronic combined systolic and diastolic CHF (congestive heart failure) (HCC)   History of CAD (coronary artery disease)   History of cocaine use   CKD (chronic kidney disease) stage 3, GFR 30-59 ml/min (HCC)   Elevated troponin   Cocaine use   Marijuana use   Discharged Condition: stable  Hospital Course:  Patient is a 58 year old male with past medical history significant for coronary artery disease, CVA, hep C, stroke, hypertension, MI, asthma and combined congestive heart failure with EF of 30%.  Patient has continued to use cocaine, marijuana and Browny.  UDS was positive for cocaine.  Patient was admitted with acute on chronic combined systolic and diastolic CHF exacerbation.   Echo revealed: 1. Left ventricular ejection fraction, by estimation, is 25 to 30%. Left  ventricular ejection fraction by 3D volume is 28 %. The left ventricle has  severely decreased function. The left ventricle demonstrates global  hypokinesis. The left ventricular  internal cavity size was mildly dilated. There is moderate concentric left  ventricular hypertrophy. Left ventricular diastolic parameters are  consistent with Grade I diastolic dysfunction (impaired relaxation).  Elevated left atrial pressure.   2. Right ventricular systolic function is mildly reduced. The right  ventricular size is normal.   3. Left atrial size was severely dilated.   4. Right atrial size was moderately dilated.   5. Mild mitral valve regurgitation.   6. The aortic valve is  tricuspid. Aortic valve regurgitation is not  visualized. Aortic valve sclerosis/calcification is present, without any  evidence of aortic stenosis.   7. The inferior vena cava is normal in size with greater than 50%  respiratory variability, suggesting right atrial pressure of 3 mmHg.      01/29/2024: Echocardiogram done today revealed left ventricular estimated EF of 25 to 30% (see above documentation).  Patient is not keen on being discharged back home today.  Patient seems to be homeless.  Will consult TOC team.    Consults: None  Significant Diagnostic Studies:  Echo revealed: 1. Left ventricular ejection fraction, by estimation, is 25 to 30%. Left  ventricular ejection fraction by 3D volume is 28 %. The left ventricle has  severely decreased function. The left ventricle demonstrates global  hypokinesis. The left ventricular  internal cavity size was mildly dilated. There is moderate concentric left  ventricular hypertrophy. Left ventricular diastolic parameters are  consistent with Grade I diastolic dysfunction (impaired relaxation).  Elevated left atrial pressure.   2. Right ventricular systolic function is mildly reduced. The right  ventricular size is normal.   3. Left atrial size was severely dilated.   4. Right atrial size was moderately dilated.   5. Mild mitral valve regurgitation.   6. The aortic valve is tricuspid. Aortic valve regurgitation is not  visualized. Aortic valve sclerosis/calcification is present, without any  evidence of aortic stenosis.   7. The inferior vena cava is normal in size with greater than 50%  respiratory variability, suggesting right atrial pressure of 3 mmHg.    Discharge Exam: Blood pressure (!) 134/90, pulse 79, temperature 98.6 F (37 C), temperature  source Oral, resp. rate 20, height 6' 2 (1.88 m), weight 66.5 kg, SpO2 100%.   Disposition: Discharge disposition: 01-Home or Self Care       Discharge Instructions     Diet -  low sodium heart healthy   Complete by: As directed    Increase activity slowly   Complete by: As directed       Allergies as of 01/30/2024       Reactions   Ace Inhibitors Swelling   Penicillins Swelling   Throat and eye swelling Has patient had a PCN reaction causing immediate rash, facial/tongue/throat swelling, SOB or lightheadedness with hypotension: Yes Has patient had a PCN reaction causing severe rash involving mucus membranes or skin necrosis: No Has patient had a PCN reaction that required hospitalization: No Has patient had a PCN reaction occurring within the last 10 years: No If all of the above answers are NO, then may proceed with Cephalosporin use.        Medication List     STOP taking these medications    bumetanide  1 MG tablet Commonly known as: BUMEX    tiZANidine 4 MG tablet Commonly known as: ZANAFLEX       TAKE these medications    albuterol  108 (90 Base) MCG/ACT inhaler Commonly known as: VENTOLIN  HFA Inhale 2 puffs into the lungs every 4 (four) hours as needed for wheezing.   aspirin  EC 81 MG tablet Chew 81 mg by mouth daily.   blood glucose meter kit and supplies Dispense based on patient and insurance preference. Use up to four times daily as directed. (FOR ICD-10 E10.9, E11.9).   carvedilol  6.25 MG tablet Commonly known as: COREG  Take 1 tablet (6.25 mg total) by mouth 2 (two) times daily with a meal. What changed:  medication strength how much to take   empagliflozin  10 MG Tabs tablet Commonly known as: JARDIANCE  Take 1 tablet (10 mg total) by mouth daily.   feeding supplement Liqd Take 237 mLs by mouth 2 (two) times daily between meals. Start taking on: January 31, 2024   finasteride  5 MG tablet Commonly known as: PROSCAR  Take 5 mg by mouth daily.   furosemide  40 MG tablet Commonly known as: LASIX  Take 40 mg by mouth 2 (two) times daily.   hydrALAZINE  50 MG tablet Commonly known as: APRESOLINE  Take 50 mg by mouth 2  (two) times daily.   nitroGLYCERIN  0.4 MG SL tablet Commonly known as: NITROSTAT  Place 1 tablet (0.4 mg total) under the tongue every 5 (five) minutes as needed.   rosuvastatin  40 MG tablet Commonly known as: CRESTOR  Take 1 tablet (40 mg total) by mouth daily.   spironolactone  25 MG tablet Commonly known as: ALDACTONE  Take 0.5 tablets (12.5 mg total) by mouth daily.   tamsulosin  0.4 MG Caps capsule Commonly known as: FLOMAX  Take 0.4 mg by mouth daily.       Time spent: 35 minutes.  SignedBETHA Leatrice LILLETTE Rosario 01/30/2024, 3:35 PM

## 2024-03-18 ENCOUNTER — Emergency Department (HOSPITAL_COMMUNITY)

## 2024-03-18 ENCOUNTER — Other Ambulatory Visit: Payer: Self-pay

## 2024-03-18 ENCOUNTER — Observation Stay (HOSPITAL_COMMUNITY)
Admission: EM | Admit: 2024-03-18 | Discharge: 2024-03-20 | Disposition: A | Discharge status: Home / Self Care | Attending: Internal Medicine | Admitting: Internal Medicine

## 2024-03-18 DIAGNOSIS — R0789 Other chest pain: Secondary | ICD-10-CM | POA: Diagnosis not present

## 2024-03-18 DIAGNOSIS — R079 Chest pain, unspecified: Principal | ICD-10-CM | POA: Diagnosis present

## 2024-03-18 DIAGNOSIS — Z8673 Personal history of transient ischemic attack (TIA), and cerebral infarction without residual deficits: Secondary | ICD-10-CM | POA: Diagnosis not present

## 2024-03-18 DIAGNOSIS — R0602 Shortness of breath: Secondary | ICD-10-CM | POA: Diagnosis not present

## 2024-03-18 DIAGNOSIS — N179 Acute kidney failure, unspecified: Secondary | ICD-10-CM | POA: Insufficient documentation

## 2024-03-18 DIAGNOSIS — F17210 Nicotine dependence, cigarettes, uncomplicated: Secondary | ICD-10-CM | POA: Diagnosis not present

## 2024-03-18 DIAGNOSIS — N400 Enlarged prostate without lower urinary tract symptoms: Secondary | ICD-10-CM | POA: Insufficient documentation

## 2024-03-18 DIAGNOSIS — Z79899 Other long term (current) drug therapy: Secondary | ICD-10-CM | POA: Diagnosis not present

## 2024-03-18 DIAGNOSIS — I5022 Chronic systolic (congestive) heart failure: Secondary | ICD-10-CM | POA: Diagnosis not present

## 2024-03-18 DIAGNOSIS — R7989 Other specified abnormal findings of blood chemistry: Secondary | ICD-10-CM | POA: Insufficient documentation

## 2024-03-18 DIAGNOSIS — I110 Hypertensive heart disease with heart failure: Secondary | ICD-10-CM | POA: Diagnosis not present

## 2024-03-18 DIAGNOSIS — I2510 Atherosclerotic heart disease of native coronary artery without angina pectoris: Secondary | ICD-10-CM | POA: Insufficient documentation

## 2024-03-18 DIAGNOSIS — I214 Non-ST elevation (NSTEMI) myocardial infarction: Secondary | ICD-10-CM | POA: Diagnosis not present

## 2024-03-18 DIAGNOSIS — Z7982 Long term (current) use of aspirin: Secondary | ICD-10-CM | POA: Diagnosis not present

## 2024-03-18 DIAGNOSIS — J45909 Unspecified asthma, uncomplicated: Secondary | ICD-10-CM | POA: Insufficient documentation

## 2024-03-18 LAB — COMPREHENSIVE METABOLIC PANEL WITH GFR
ALT: 7 U/L (ref 0–44)
AST: 26 U/L (ref 15–41)
Albumin: 4.1 g/dL (ref 3.5–5.0)
Alkaline Phosphatase: 112 U/L (ref 38–126)
Anion gap: 14 (ref 5–15)
BUN: 26 mg/dL — ABNORMAL HIGH (ref 6–20)
CO2: 21 mmol/L — ABNORMAL LOW (ref 22–32)
Calcium: 9.8 mg/dL (ref 8.9–10.3)
Chloride: 103 mmol/L (ref 98–111)
Creatinine, Ser: 1.55 mg/dL — ABNORMAL HIGH (ref 0.61–1.24)
GFR, Estimated: 52 mL/min — ABNORMAL LOW (ref >60)
Glucose, Bld: 131 mg/dL — ABNORMAL HIGH (ref 70–99)
Potassium: 3.6 mmol/L (ref 3.5–5.1)
Sodium: 137 mmol/L (ref 135–145)
Total Bilirubin: 0.6 mg/dL (ref 0.0–1.2)
Total Protein: 7.8 g/dL (ref 6.5–8.1)

## 2024-03-18 LAB — URINE DRUG SCREEN
Amphetamines: NEGATIVE
Barbiturates: NEGATIVE
Benzodiazepines: NEGATIVE
Cocaine: POSITIVE — AB
Fentanyl: NEGATIVE
Methadone Scn, Ur: NEGATIVE
Opiates: NEGATIVE
Tetrahydrocannabinol: POSITIVE — AB

## 2024-03-18 LAB — CBC
HCT: 46.8 % (ref 39.0–52.0)
Hemoglobin: 16 g/dL (ref 13.0–17.0)
MCH: 30.7 pg (ref 26.0–34.0)
MCHC: 34.2 g/dL (ref 30.0–36.0)
MCV: 89.8 fL (ref 80.0–100.0)
Platelets: 269 K/uL (ref 150–400)
RBC: 5.21 MIL/uL (ref 4.22–5.81)
RDW: 14 % (ref 11.5–15.5)
WBC: 8.8 K/uL (ref 4.0–10.5)
nRBC: 0 % (ref 0.0–0.2)

## 2024-03-18 LAB — TROPONIN T, HIGH SENSITIVITY
Troponin T High Sensitivity: 55 ng/L — ABNORMAL HIGH (ref 0–19)
Troponin T High Sensitivity: 51 ng/L — ABNORMAL HIGH (ref 0–19)

## 2024-03-18 LAB — ETHANOL: Alcohol, Ethyl (B): <15 mg/dL (ref <15)

## 2024-03-18 MED ORDER — EMPAGLIFLOZIN 10 MG PO TABS
10.0000 mg | ORAL_TABLET | Freq: Every day | ORAL | Status: DC
Start: 2024-03-18 — End: 2024-03-20
  Administered 2024-03-19 – 2024-03-20 (×2): 10 mg via ORAL
  Filled 2024-03-18 (×2): qty 1

## 2024-03-18 MED ORDER — FINASTERIDE 5 MG PO TABS
5.0000 mg | ORAL_TABLET | Freq: Every day | ORAL | Status: DC
  Administered 2024-03-19 – 2024-03-20 (×2): 5 mg via ORAL
  Filled 2024-03-18 (×2): qty 1

## 2024-03-18 MED ORDER — HEPARIN SODIUM (PORCINE) 5000 UNIT/ML IJ SOLN
5000.0000 [IU] | Freq: Three times a day (TID) | INTRAMUSCULAR | Status: DC
  Administered 2024-03-18 – 2024-03-20 (×6): 5000 [IU] via SUBCUTANEOUS
  Filled 2024-03-18 (×6): qty 1

## 2024-03-18 MED ORDER — FUROSEMIDE 40 MG PO TABS
40.0000 mg | ORAL_TABLET | Freq: Two times a day (BID) | ORAL | Status: DC
  Administered 2024-03-18 – 2024-03-20 (×4): 40 mg via ORAL
  Filled 2024-03-18: qty 2
  Filled 2024-03-18 (×3): qty 1

## 2024-03-18 MED ORDER — NITROGLYCERIN 2 % TD OINT
1.0000 [in_us] | TOPICAL_OINTMENT | Freq: Once | TRANSDERMAL | Status: AC
  Administered 2024-03-18: 1 [in_us] via TOPICAL
  Filled 2024-03-18: qty 1

## 2024-03-18 MED ORDER — ROSUVASTATIN CALCIUM 20 MG PO TABS
40.0000 mg | ORAL_TABLET | Freq: Every day | ORAL | Status: DC
  Administered 2024-03-19 – 2024-03-20 (×2): 40 mg via ORAL
  Filled 2024-03-18 (×2): qty 2

## 2024-03-18 MED ORDER — ASPIRIN 81 MG PO CHEW
324.0000 mg | CHEWABLE_TABLET | Freq: Once | ORAL | Status: AC
  Administered 2024-03-18: 324 mg via ORAL
  Filled 2024-03-18: qty 4

## 2024-03-18 MED ORDER — AMLODIPINE BESYLATE 5 MG PO TABS
5.0000 mg | ORAL_TABLET | Freq: Every day | ORAL | Status: DC
  Administered 2024-03-19: 5 mg via ORAL
  Filled 2024-03-18: qty 1

## 2024-03-18 MED ORDER — ASPIRIN 81 MG PO TBEC
81.0000 mg | DELAYED_RELEASE_TABLET | Freq: Every day | ORAL | Status: DC
  Administered 2024-03-19 – 2024-03-20 (×2): 81 mg via ORAL
  Filled 2024-03-18 (×2): qty 1

## 2024-03-18 MED ORDER — TAMSULOSIN HCL 0.4 MG PO CAPS
0.4000 mg | ORAL_CAPSULE | Freq: Every day | ORAL | Status: DC
Start: 2024-03-19 — End: 2024-03-20
  Administered 2024-03-19 – 2024-03-20 (×2): 0.4 mg via ORAL
  Filled 2024-03-18 (×2): qty 1

## 2024-03-18 MED ORDER — ONDANSETRON HCL 4 MG/2ML IJ SOLN: 4.0000 mg | Freq: Four times a day (QID) | INTRAMUSCULAR | Status: DC | PRN

## 2024-03-18 MED ORDER — HYDRALAZINE HCL 20 MG/ML IJ SOLN
10.0000 mg | Freq: Four times a day (QID) | INTRAMUSCULAR | Status: DC | PRN
  Administered 2024-03-19 – 2024-03-20 (×2): 10 mg via INTRAVENOUS
  Filled 2024-03-18 (×2): qty 1

## 2024-03-18 MED ORDER — ASPIRIN 81 MG PO TBEC: 81.0000 mg | DELAYED_RELEASE_TABLET | Freq: Every day | ORAL | Status: DC

## 2024-03-18 MED ORDER — ACETAMINOPHEN 325 MG PO TABS
650.0000 mg | ORAL_TABLET | ORAL | Status: DC | PRN
  Administered 2024-03-19: 650 mg via ORAL
  Filled 2024-03-18 (×2): qty 2

## 2024-03-18 MED ORDER — HYDRALAZINE HCL 50 MG PO TABS
50.0000 mg | ORAL_TABLET | Freq: Two times a day (BID) | ORAL | Status: DC
Start: 2024-03-18 — End: 2024-03-19
  Administered 2024-03-18 – 2024-03-19 (×2): 50 mg via ORAL
  Filled 2024-03-18 (×2): qty 1

## 2024-03-18 NOTE — ED Triage Notes (Signed)
 Patient to ED by POV with c/o chest pain cause by fentanyl usage. He complains of chest discomfort and SOB. Has HX of stroke, enlarged heart and has 2 stents in heart. Asking to be sent across the street to [facility].

## 2024-03-18 NOTE — H&P (Signed)
 History and Physical    WOODFORD STREGE FMW:[id number] DOB: [birth year] DOA: 03/18/2024  I have briefly reviewed the patient's prior medical records in [facility] Link  PCP: Patient, No Pcp Per  Patient coming from: Home  Chief Complaint: Chest pain  HPI: [name] is a 58 y.o. male with medical history significant of coronary artery disease with prior history of 2 stents.  Patient comes to the ER complaining of chest pain after using bad drugs for the disease patient states that when he was using crack cocaine and thinks that someone put fentanyl in it.  Patient says that some of his chest pain has stopped but he still having some chest discomfort.  He is short of breath but states he is always short of breath.  Patient also states he has not been taking any of his home medications.  Currently his main complaint is urinary retention and feels like he needs to restart his Flomax .  In the ER, EKG was done that did have some changes compared to baseline.  Cardiology was consulted (Dr. Joelle) who plans to see the patient in the a.m.  They recommended serial troponins and aspirin  load.   Review of Systems: As per HPI otherwise 10 point review of systems negative.   Past Medical History:  Diagnosis Date   Allergic rhinitis    Anxiety    Asthma    Bipolar disorder (HCC)    Bradycardia    CAD (coronary artery disease)    CVA (cerebral vascular accident) (HCC)    GERD (gastroesophageal reflux disease)    Headache    Hepatitis C    History of heart artery stent    History of myocardial infarction    History of stroke    Hypercholesterolemia    Hypertension    MI (myocardial infarction) (HCC)    Stroke [facility])     Past Surgical History:  Procedure Laterality Date   CORONARY STENT PLACEMENT     x2 stents      reports that he has been smoking cigarettes. He has never used smokeless tobacco. He reports that he does not drink alcohol and does not use drugs.  Allergies   Allergen Reactions   Ace Inhibitors Swelling   Penicillins Swelling    Throat and eye swelling Has patient had a PCN reaction causing immediate rash, facial/tongue/throat swelling, SOB or lightheadedness with hypotension: Yes Has patient had a PCN reaction causing severe rash involving mucus membranes or skin necrosis: No Has patient had a PCN reaction that required hospitalization: No Has patient had a PCN reaction occurring within the last 10 years: No If all of the above answers are NO, then may proceed with Cephalosporin use.    Family History  Problem Relation Age of Onset   Colon polyps Mother    Stroke Father    Heart disease Maternal Grandmother    Hearing loss Maternal Grandmother    Skin cancer Maternal Grandmother    Asthma Maternal Aunt    Emphysema Maternal Aunt    Arthritis Maternal Uncle    Colon cancer Neg Hx    Esophageal cancer Neg Hx    Rectal cancer Neg Hx    Stomach cancer Neg Hx     Prior to Admission medications   Medication Sig Start Date End Date Taking? Authorizing Provider  albuterol  (VENTOLIN  HFA) 108 (90 Base) MCG/ACT inhaler Inhale 2 puffs into the lungs every 4 (four) hours as needed for wheezing. 04/24/23   [provider]  aspirin  81 MG EC tablet Chew 81 mg by mouth daily.    [provider]  blood glucose meter kit and supplies Dispense based on patient and insurance preference. Use up to four times daily as directed. (FOR ICD-10 E10.9, E11.9). 07/22/23   Fairy Frames, MD  carvedilol  (COREG ) 6.25 MG tablet Take 1 tablet (6.25 mg total) by mouth 2 (two) times daily with a meal. 01/30/24   Rosario Leatrice FERNS, MD  empagliflozin  (JARDIANCE ) 10 MG TABS tablet Take 1 tablet (10 mg total) by mouth daily. 07/23/23   Fairy Frames, MD  finasteride  (PROSCAR ) 5 MG tablet Take 5 mg by mouth daily. 01/25/21   [provider]  furosemide  (LASIX ) 40 MG tablet Take 40 mg by mouth 2 (two) times daily. 01/12/24   [provider]   hydrALAZINE  (APRESOLINE ) 50 MG tablet Take 50 mg by mouth 2 (two) times daily. 01/12/24   [provider]  nitroGLYCERIN  (NITROSTAT ) 0.4 MG SL tablet Place 1 tablet (0.4 mg total) under the tongue every 5 (five) minutes as needed. Patient not taking: Reported on 01/28/2024 03/19/21 06/17/21  Revankar, Jennifer SAUNDERS, MD  rosuvastatin  (CRESTOR ) 40 MG tablet Take 1 tablet (40 mg total) by mouth daily. 07/23/23   Fairy Frames, MD  spironolactone  (ALDACTONE ) 25 MG tablet Take 0.5 tablets (12.5 mg total) by mouth daily. 07/23/23   Fairy Frames, MD  tamsulosin  (FLOMAX ) 0.4 MG CAPS capsule Take 0.4 mg by mouth daily. 04/09/17   [provider]    Physical Exam: Vitals:   03/18/24 1159 03/18/24 1203  BP: (!) 160/138   Pulse: 75   Resp: 16   Temp: 98.4 F (36.9 C)   TempSrc: Oral   SpO2: 100%   Weight:  65.8 kg  Height:  6' 2 (1.88 m)      Constitutional: NAD, calm, comfortable Eyes: PERRL, lids and conjunctivae normal ENMT: Mucous membranes are moist. Posterior pharynx clear of any exudate or lesions.Normal dentition.  Neck: normal, supple, no masses, no thyromegaly Respiratory: clear to auscultation bilaterally, no wheezing, no crackles. Normal respiratory effort. No accessory muscle use.  Cardiovascular: Regular rate and rhythm, no murmurs / rubs / gallops. No extremity edema. 2+ pedal pulses.  Abdomen: no tenderness, no masses palpated. Bowel sounds positive.  Musculoskeletal: no clubbing / cyanosis. Normal muscle tone.  Skin: no rashes, lesions, ulcers. No induration Neurologic: CN 2-12 grossly intact. Strength 5/5 in all 4.  Psychiatric: Normal judgment and insight. Alert and oriented x 3. Normal mood.   Labs on Admission: I have personally reviewed following labs and imaging studies  CBC: Recent Labs  Lab 03/18/24 1250  WBC 8.8  HGB 16.0  HCT 46.8  MCV 89.8  PLT 269   Basic Metabolic Panel: Recent Labs  Lab 03/18/24 1250  NA 137  K 3.6  CL 103  CO2 21*   GLUCOSE 131*  BUN 26*  CREATININE 1.55*  CALCIUM  9.8   GFR: Estimated Creatinine Clearance: 48.3 mL/min (A) (by C-G formula based on SCr of 1.55 mg/dL (H)). Liver Function Tests: Recent Labs  Lab 03/18/24 1250  AST 26  ALT 7  ALKPHOS 112  BILITOT 0.6  PROT 7.8  ALBUMIN 4.1   No results for input(s): LIPASE, AMYLASE in the last 168 hours. No results for input(s): AMMONIA in the last 168 hours. Coagulation Profile: No results for input(s): INR, PROTIME in the last 168 hours. Cardiac Enzymes: No results for input(s): CKTOTAL, CKMB, CKMBINDEX, TROPONINI in the last 168  hours. BNP (last 3 results) Recent Labs    05/28/23 0850  PROBNP 24,067*   HbA1C: No results for input(s): HGBA1C in the last 72 hours. CBG: No results for input(s): GLUCAP in the last 168 hours. Lipid Profile: No results for input(s): CHOL, HDL, LDLCALC, TRIG, CHOLHDL, LDLDIRECT in the last 72 hours. Thyroid Function Tests: No results for input(s): TSH, T4TOTAL, FREET4, T3FREE, THYROIDAB in the last 72 hours. Anemia Panel: No results for input(s): VITAMINB12, FOLATE, FERRITIN, TIBC, IRON, RETICCTPCT in the last 72 hours. Urine analysis:    Component Value Date/Time   COLORURINE YELLOW 07/10/2023 1555   APPEARANCEUR CLEAR 07/10/2023 1555   LABSPEC 1.014 07/10/2023 1555   PHURINE 5.0 07/10/2023 1555   GLUCOSEU NEGATIVE 07/10/2023 1555   HGBUR SMALL (A) 07/10/2023 1555   BILIRUBINUR NEGATIVE 07/10/2023 1555   KETONESUR NEGATIVE 07/10/2023 1555   PROTEINUR 100 (A) 07/10/2023 1555   UROBILINOGEN 1.0 03/02/2010 2212   NITRITE NEGATIVE 07/10/2023 1555   LEUKOCYTESUR NEGATIVE 07/10/2023 1555     Radiological Exams on Admission: DG Chest Portable 2 Views Result Date: 03/18/2024 CLINICAL DATA:  SOB EXAM: CHEST  2 VIEW PORTABLE COMPARISON:  Chest x-ray 01/27/2024 FINDINGS: The heart and mediastinal contours are within normal limits. Coronary artery  stent. No focal consolidation. No pulmonary edema. No pleural effusion. No pneumothorax. No acute osseous abnormality. IMPRESSION: No active cardiopulmonary disease. Electronically Signed   By: Morgane  Naveau M.D.   On: 03/18/2024 12:52    EKG: Independently reviewed.  Some V3 elevation as well as some depressions in lead III and aVF  Assessment/Plan Principal Problem:   Chest pain   Chest pain after Cocaine intoxication and medical non-compliance -encourage cessation -trend trop -ASA -echo done last month for similar situation but also with flash pum edema -resume home statin and other meds  BPH -flomax     Harlene RAYMOND Bowl [facility]   How to contact the [facility] Attending or Consulting provider 7A - 7P or covering provider during after hours 7P -7A, for this patient?  Check the care team in [facility] and look for a) attending/consulting TR[facility] provider listed and b) the TRH team listed Log into [email or web address] and use [facility]'s universal password to access. If you do not have the password, please contact the hospital operator. Locate the [facility] provider you are looking for under [facility] and page to a number that you can be directly reached. If you still have difficulty reaching the provider, please page the [facility] (Director on Call) for the Hospitalists listed on amion for assistance.  03/18/2024, 3:46 PM

## 2024-03-18 NOTE — ED Provider Notes (Signed)
 [place] EMERGENCY DEPARTMENT AT [facility] Provider Note   CSN: [id number] Arrival date & time: 03/18/24  1155     Patient presents with: Chest Pain   [name] is a 58 y.o. male.    Chest Pain  Presents with chest pain.  Patient states that he has a history of 2 stents.  Patient states that he started having chest pain last night after using drugs.  Patient states that he was using crack cocaine.  Patient states that some of his chest pain subsequently resolved but still having some slight chest discomfort.  As of breath but states this is his baseline.  No worsening shortness of breath.  No pleuritic chest pain hemoptysis.  No nausea vomit diarrhea.  Patient states he has been noncompliant with his medication here recently.  Patient is asking for possible rehab placement.  Previous medical history reviewed : Patient was last admitted and discharged in August 2025.  Was admitted in setting of flash pulmonary edema.  Acute on chronic combined systolic and diastolic CHF.      Prior to Admission medications   Medication Sig Start Date End Date Taking? Authorizing Provider  albuterol  (VENTOLIN  HFA) 108 (90 Base) MCG/ACT inhaler Inhale 2 puffs into the lungs every 4 (four) hours as needed for wheezing. 04/24/23   [provider]  aspirin  81 MG EC tablet Chew 81 mg by mouth daily.    [provider]  blood glucose meter kit and supplies Dispense based on patient and insurance preference. Use up to four times daily as directed. (FOR ICD-10 E10.9, E11.9). 07/22/23   Fairy Frames, MD  carvedilol  (COREG ) 6.25 MG tablet Take 1 tablet (6.25 mg total) by mouth 2 (two) times daily with a meal. 01/30/24   Rosario Leatrice FERNS, MD  empagliflozin  (JARDIANCE ) 10 MG TABS tablet Take 1 tablet (10 mg total) by mouth daily. 07/23/23   Fairy Frames, MD  finasteride  (PROSCAR ) 5 MG tablet Take 5 mg by mouth daily. 01/25/21   [provider]  furosemide  (LASIX ) 40  MG tablet Take 40 mg by mouth 2 (two) times daily. 01/12/24   [provider]  hydrALAZINE  (APRESOLINE ) 50 MG tablet Take 50 mg by mouth 2 (two) times daily. 01/12/24   [provider]  nitroGLYCERIN  (NITROSTAT ) 0.4 MG SL tablet Place 1 tablet (0.4 mg total) under the tongue every 5 (five) minutes as needed. Patient not taking: Reported on 01/28/2024 03/19/21 06/17/21  Revankar, Jennifer SAUNDERS, MD  rosuvastatin  (CRESTOR ) 40 MG tablet Take 1 tablet (40 mg total) by mouth daily. 07/23/23   Fairy Frames, MD  spironolactone  (ALDACTONE ) 25 MG tablet Take 0.5 tablets (12.5 mg total) by mouth daily. 07/23/23   Fairy Frames, MD  tamsulosin  (FLOMAX ) 0.4 MG CAPS capsule Take 0.4 mg by mouth daily. 04/09/17   [provider]    Allergies: Ace inhibitors and Penicillins    Review of Systems  Cardiovascular:  Positive for chest pain.    Updated Vital Signs BP (!) 160/138 (BP Location: Right Arm)   Pulse 75   Temp 98.4 F (36.9 C) (Oral)   Resp 16   Ht 6' 2 (1.88 m)   Wt 65.8 kg   SpO2 100%   BMI 18.62 kg/m   Physical Exam  (all labs ordered are listed, but only abnormal results are displayed) Labs Reviewed  COMPREHENSIVE METABOLIC PANEL WITH GFR - Abnormal; Notable for the following components:      Result Value   CO2  21 (*)    Glucose, Bld 131 (*)    BUN 26 (*)    Creatinine, Ser 1.55 (*)    GFR, Estimated 52 (*)    All other components within normal limits  URINE DRUG SCREEN - Abnormal; Notable for the following components:   Cocaine POSITIVE (*)    Tetrahydrocannabinol POSITIVE (*)    All other components within normal limits  TROPONIN T, HIGH SENSITIVITY - Abnormal; Notable for the following components:   Troponin T High Sensitivity 55 (*)    All other components within normal limits  ETHANOL  CBC  TROPONIN T, HIGH SENSITIVITY    EKG: None  Radiology: DG Chest Portable 2 Views Result Date: 03/18/2024 CLINICAL DATA:  SOB EXAM: CHEST  2 VIEW PORTABLE  COMPARISON:  Chest x-ray 01/27/2024 FINDINGS: The heart and mediastinal contours are within normal limits. Coronary artery stent. No focal consolidation. No pulmonary edema. No pleural effusion. No pneumothorax. No acute osseous abnormality. IMPRESSION: No active cardiopulmonary disease. Electronically Signed   By: Morgane  Naveau M.D.   On: 03/18/2024 12:52     Procedures   Medications Ordered in the ED  nitroGLYCERIN  (NITROGLYN) 2 % ointment 1 inch (has no administration in time range)  aspirin  chewable tablet 324 mg (324 mg Oral Given 03/18/24 1524)                                    Medical Decision Making Amount and/or Complexity of Data Reviewed Labs: ordered. Radiology: ordered.  Risk OTC drugs. Prescription drug management.     Presents with chest pain.  Patient states that he has a history of 2 stents.  Patient states that he started having chest pain last night after using drugs.  Patient states that he was using crack cocaine.  Patient states that some of his chest pain subsequently resolved but still having some slight chest discomfort.  As of breath but states this is his baseline.  No worsening shortness of breath.  No pleuritic chest pain hemoptysis.  No nausea vomit diarrhea.  Patient states he has been noncompliant with his medication here recently.  Patient is asking for possible rehab placement.  Previous medical history reviewed : Patient was last admitted and discharged in August 2025.  Was admitted in setting of flash pulmonary edema.  Acute on chronic combined systolic and diastolic CHF.    On exam, patient hemodynamically stable.  ANO x 3 GCS 15.  Vital signs stable.  Slightly hypertensive but otherwise no hypoxia.  Afebrile.  Reviewed EKG.  Does have some dynamic changes compared to baseline.  V3 elevation as well as some depressions in lead to 3 and aVF.  Active chest pain.   cardiology regarding the patient spoke to Dr. Carilyn.  Recommended inpatient  admission.  Serial troponins.  Aspirin  load but no indication to start heparin  at this point time.  Can stay at [facility] long.  Not consistent with STEMI.   Will address patient's pain with some nitro.  Started patient on aspirin .   No concerns or PE.  Likely changes related to his crack cocaine abuse.      Final diagnoses:  Chest pain, unspecified type  NSTEMI (non-ST elevated myocardial infarction) [facility])    ED Discharge Orders     None          Simon Lavonia SAILOR, MD 03/18/24 1528

## 2024-03-19 DIAGNOSIS — I5022 Chronic systolic (congestive) heart failure: Secondary | ICD-10-CM | POA: Diagnosis not present

## 2024-03-19 DIAGNOSIS — N400 Enlarged prostate without lower urinary tract symptoms: Secondary | ICD-10-CM | POA: Diagnosis not present

## 2024-03-19 DIAGNOSIS — N179 Acute kidney failure, unspecified: Secondary | ICD-10-CM | POA: Diagnosis not present

## 2024-03-19 DIAGNOSIS — Z79899 Other long term (current) drug therapy: Secondary | ICD-10-CM | POA: Diagnosis not present

## 2024-03-19 DIAGNOSIS — R7989 Other specified abnormal findings of blood chemistry: Secondary | ICD-10-CM

## 2024-03-19 DIAGNOSIS — Z8673 Personal history of transient ischemic attack (TIA), and cerebral infarction without residual deficits: Secondary | ICD-10-CM | POA: Diagnosis not present

## 2024-03-19 DIAGNOSIS — J45909 Unspecified asthma, uncomplicated: Secondary | ICD-10-CM | POA: Diagnosis not present

## 2024-03-19 DIAGNOSIS — I110 Hypertensive heart disease with heart failure: Secondary | ICD-10-CM | POA: Diagnosis not present

## 2024-03-19 DIAGNOSIS — F17210 Nicotine dependence, cigarettes, uncomplicated: Secondary | ICD-10-CM | POA: Diagnosis not present

## 2024-03-19 DIAGNOSIS — Z7982 Long term (current) use of aspirin: Secondary | ICD-10-CM | POA: Diagnosis not present

## 2024-03-19 DIAGNOSIS — I214 Non-ST elevation (NSTEMI) myocardial infarction: Secondary | ICD-10-CM | POA: Diagnosis not present

## 2024-03-19 DIAGNOSIS — I2510 Atherosclerotic heart disease of native coronary artery without angina pectoris: Secondary | ICD-10-CM | POA: Diagnosis not present

## 2024-03-19 DIAGNOSIS — I10 Essential (primary) hypertension: Secondary | ICD-10-CM | POA: Diagnosis not present

## 2024-03-19 DIAGNOSIS — I5020 Unspecified systolic (congestive) heart failure: Secondary | ICD-10-CM | POA: Diagnosis not present

## 2024-03-19 DIAGNOSIS — F1410 Cocaine abuse, uncomplicated: Secondary | ICD-10-CM

## 2024-03-19 DIAGNOSIS — R079 Chest pain, unspecified: Secondary | ICD-10-CM | POA: Diagnosis not present

## 2024-03-19 LAB — BASIC METABOLIC PANEL WITH GFR
Anion gap: 13 (ref 5–15)
BUN: 34 mg/dL — ABNORMAL HIGH (ref 6–20)
CO2: 23 mmol/L (ref 22–32)
Calcium: 9.1 mg/dL (ref 8.9–10.3)
Chloride: 105 mmol/L (ref 98–111)
Creatinine, Ser: 1.4 mg/dL — ABNORMAL HIGH (ref 0.61–1.24)
GFR, Estimated: 58 mL/min — ABNORMAL LOW (ref >60)
Glucose, Bld: 101 mg/dL — ABNORMAL HIGH (ref 70–99)
Potassium: 3.6 mmol/L (ref 3.5–5.1)
Sodium: 140 mmol/L (ref 135–145)

## 2024-03-19 LAB — MAGNESIUM: Magnesium: 2.1 mg/dL (ref 1.7–2.4)

## 2024-03-19 LAB — PHOSPHORUS: Phosphorus: 3.8 mg/dL (ref 2.5–4.6)

## 2024-03-19 MED ORDER — POTASSIUM CHLORIDE CRYS ER 20 MEQ PO TBCR
40.0000 meq | EXTENDED_RELEASE_TABLET | Freq: Once | ORAL | Status: AC
  Administered 2024-03-19: 40 meq via ORAL
  Filled 2024-03-19: qty 2

## 2024-03-19 MED ORDER — HYDRALAZINE HCL 50 MG PO TABS
100.0000 mg | ORAL_TABLET | Freq: Two times a day (BID) | ORAL | Status: DC
  Administered 2024-03-19 – 2024-03-20 (×2): 100 mg via ORAL
  Filled 2024-03-19 (×2): qty 2

## 2024-03-19 MED ORDER — AMLODIPINE BESYLATE 10 MG PO TABS
10.0000 mg | ORAL_TABLET | Freq: Every day | ORAL | Status: DC
Start: 2024-03-20 — End: 2024-03-20
  Administered 2024-03-20: 10 mg via ORAL
  Filled 2024-03-19: qty 1

## 2024-03-19 NOTE — Progress Notes (Signed)
 PROGRESS NOTE    [name]  FMW:[id number] DOB: 04/08/1966 DOA: 03/18/2024 PCP: Patient, No Pcp Per    Brief Narrative:  [name] is a 58 y.o. male with medical history significant of coronary artery disease with prior history of 2 stents.  Patient comes to the ER complaining of chest pain after using bad drugs for the disease patient states that when he was using crack cocaine and thinks that someone put fentanyl in it.  Patient says that some of his chest pain has stopped but he still having some chest discomfort.  He is short of breath but states he is always short of breath.  Patient also states he has not been taking any of his home medications.  Currently his main complaint is urinary retention and feels like he needs to restart his Flomax .   Assessment and Plan: Chest pain after Cocaine intoxication and medical non-compliance -encourage cessation -trend trop -ASA -echo done last month for similar situation but also with flash pum edema- repeat limited echo -resume home statin and other meds- added norvasc  -plan for outpatient cards work up   BPH -flomax   DVT prophylaxis: heparin  injection 5,000 Units Start: 03/18/24 2200 SCDs Start: 03/18/24 1542    Code Status: Full Code Family Communication:   Disposition Plan:  Level of care: Telemetry Status is: Observation  Suspect home in the a.m. after echo  Consultants:  Cardiology   Subjective: Hungry and demanding double portions  Objective: Vitals:   03/18/24 1636 03/18/24 1822 03/18/24 2112 03/19/24 0555  BP: (!) 145/124 (!) 153/101 (!) 160/112 (!) 152/102  Pulse: 87 78 71 70  Resp: 14 18 18 16   Temp: 98.5 F (36.9 C) 98.2 F (36.8 C) 98.1 F (36.7 C) 98.3 F (36.8 C)  TempSrc: Oral     SpO2: 100% 100% 100% 100%  Weight:      Height:        Intake/Output Summary (Last 24 hours) at 03/19/2024 1052 Last data filed at 03/19/2024 0816 Gross per 24 hour  Intake 0 ml  Output --  Net 0 ml    Filed Weights   03/18/24 1203  Weight: 65.8 kg    Examination:   General: Appearance:    Thin male in no acute distress     Lungs:      respirations unlabored  Heart:    Normal heart rate. Normal rhythm. No murmurs, rubs, or gallops.    MS:   All extremities are intact.    Neurologic:   Awake, alert       Data Reviewed: I have personally reviewed following labs and imaging studies  CBC: Recent Labs  Lab 03/18/24 1250  WBC 8.8  HGB 16.0  HCT 46.8  MCV 89.8  PLT 269   Basic Metabolic Panel: Recent Labs  Lab 03/18/24 1250 03/19/24 0606  NA 137 140  K 3.6 3.6  CL 103 105  CO2 21* 23  GLUCOSE 131* 101*  BUN 26* 34*  CREATININE 1.55* 1.40*  CALCIUM  9.8 9.1  MG  --  2.1  PHOS  --  3.8   GFR: Estimated Creatinine Clearance: 53.5 mL/min (A) (by C-G formula based on SCr of 1.4 mg/dL (H)). Liver Function Tests: Recent Labs  Lab 03/18/24 1250  AST 26  ALT 7  ALKPHOS 112  BILITOT 0.6  PROT 7.8  ALBUMIN 4.1   No results for input(s): LIPASE, AMYLASE in the last 168 hours. No results for input(s): AMMONIA in the last 168  hours. Coagulation Profile: No results for input(s): INR, PROTIME in the last 168 hours. Cardiac Enzymes: No results for input(s): CKTOTAL, CKMB, CKMBINDEX, TROPONINI in the last 168 hours. BNP (last 3 results) Recent Labs    05/28/23 0850  PROBNP 24,067*   HbA1C: No results for input(s): HGBA1C in the last 72 hours. CBG: No results for input(s): GLUCAP in the last 168 hours. Lipid Profile: No results for input(s): CHOL, HDL, LDLCALC, TRIG, CHOLHDL, LDLDIRECT in the last 72 hours. Thyroid Function Tests: No results for input(s): TSH, T4TOTAL, FREET4, T3FREE, THYROIDAB in the last 72 hours. Anemia Panel: No results for input(s): VITAMINB12, FOLATE, FERRITIN, TIBC, IRON, RETICCTPCT in the last 72 hours. Sepsis Labs: No results for input(s): PROCALCITON, LATICACIDVEN in  the last 168 hours.  No results found for this or any previous visit (from the past 240 hours).       Radiology Studies: DG Chest Portable 2 Views Result Date: 03/18/2024 CLINICAL DATA:  SOB EXAM: CHEST  2 VIEW PORTABLE COMPARISON:  Chest x-ray 01/27/2024 FINDINGS: The heart and mediastinal contours are within normal limits. Coronary artery stent. No focal consolidation. No pulmonary edema. No pleural effusion. No pneumothorax. No acute osseous abnormality. IMPRESSION: No active cardiopulmonary disease. Electronically Signed   By: Morgane  Naveau M.D.   On: 03/18/2024 12:52        Scheduled Meds:  [START ON 03/20/2024] amLODipine   10 mg Oral Daily   aspirin  EC  81 mg Oral Daily   empagliflozin   10 mg Oral Daily   finasteride   5 mg Oral Daily   furosemide   40 mg Oral BID   heparin   5,000 Units Subcutaneous Q8H   hydrALAZINE   100 mg Oral BID   rosuvastatin   40 mg Oral Daily   tamsulosin   0.4 mg Oral Daily   Continuous Infusions:   LOS: 0 days    Time spent: 45 minutes spent on chart review, discussion with nursing staff, consultants, updating family and interview/physical exam; more than 50% of that time was spent in counseling and/or coordination of care.    Harlene RAYMOND Bowl, DO [facility] Available via Epic secure chat 7am-7pm After these hours, please refer to coverage provider listed on [email or web address] 03/19/2024, 10:52 AM

## 2024-03-19 NOTE — TOC Initial Note (Signed)
 Transition of Care [facility]) - Initial/Assessment Note    Patient Details  Name: [name] MRN: [id number] Date of Birth: 1965/12/05  Transition of Care [facility]) CM/SW Contact:    Heather DELENA Saltness, LCSW Phone Number: 03/19/2024, 2:41 PM  Clinical Narrative:                 TOC consulted for substance abuse counseling/education resources. Pt's UDS positive for cocaine and marijuana. CSW met with pt at bedside to discuss substance abuse resources. Pt reports wanting to discharge to [facility] for treatment. CSW advised pt will need to be assessed and recommended for [facility] by psychiatry team. Order for psych consult has been placed, currently pending. TOC will continue to follow.    Expected Discharge Plan: Home/Self Care Barriers to Discharge: Continued Medical Work up   Patient Goals and CMS Choice Patient states their goals for this hospitalization and ongoing recovery are:: To go to [facility]        Expected Discharge Plan and Services In-house Referral: Clinical Social Work Discharge Planning Services: NA Post Acute Care Choice: NA Living arrangements for the past 2 months: Single Family Home                 DME Arranged: N/A DME Agency: NA       HH Arranged: NA HH Agency: NA        Prior Living Arrangements/Services Living arrangements for the past 2 months: Single Family Home Lives with:: Self Patient language and need for interpreter reviewed:: Yes Do you feel safe going back to the place where you live?: Yes      Need for Family Participation in Patient Care: No (Comment) Care giver support system in place?: No (comment)   Criminal Activity/Legal Involvement Pertinent to Current Situation/Hospitalization: No - Comment as needed   Permission Sought/Granted   Permission granted to share information with : No      Emotional Assessment Appearance:: Appears stated age Attitude/Demeanor/Rapport: Guarded Affect (typically observed): Stable, Guarded Orientation: : Oriented  to Self, Oriented to Place, Oriented to  Time, Oriented to Situation Alcohol / Substance Use: Illicit Drugs, Alcohol Use Psych Involvement: Yes (comment) (Pending psych evaluation)  Admission diagnosis:  NSTEMI (non-ST elevated myocardial infarction) (HCC) [I21.4] Chest pain [R07.9] Chest pain, unspecified type [R07.9] Patient Active Problem List   Diagnosis Date Noted   Chest pain 03/18/2024   Flash pulmonary edema (HCC) 01/28/2024   History of CAD (coronary artery disease) 01/28/2024   History of cocaine use 01/28/2024   CKD (chronic kidney disease) stage 3, GFR 30-59 ml/min (HCC) 01/28/2024   Elevated troponin 01/28/2024   Cocaine use 01/28/2024   Marijuana use 01/28/2024   NSVT (nonsustained ventricular tachycardia) (HCC) 07/19/2023   Protein-calorie malnutrition, severe 07/12/2023   Acute on chronic combined systolic and diastolic CHF (congestive heart failure) (HCC) 07/11/2023   AKI (acute kidney injury) 07/10/2023   Elevated LFTs 07/10/2023   Bilateral lower extremity edema 07/10/2023   Non-insulin  dependent type 2 diabetes mellitus (HCC) 07/10/2023   Cocaine abuse (HCC) 05/28/2023   Swelling of both lower extremities 05/26/2023   Dyspnea on exertion 03/19/2021   Continuous dependence on cigarette smoking 03/19/2021   Allergic rhinitis 03/11/2021   Anxiety 03/11/2021   Asthma 03/11/2021   Bipolar disorder (HCC) 03/11/2021   Bradycardia 03/11/2021   CAD (coronary artery disease) 03/11/2021   CVA (cerebral vascular accident) (HCC) 03/11/2021   GERD (gastroesophageal reflux disease) 03/11/2021   Headache 03/11/2021   Hepatitis C 03/11/2021  History of heart artery stent 03/11/2021   History of myocardial infarction 03/11/2021   History of CVA (cerebrovascular accident) 03/11/2021   Hypercholesterolemia 03/11/2021   Essential hypertension 03/11/2021   MI (myocardial infarction) (HCC) 03/11/2021   Stroke (HCC) 03/11/2021   PCP:  Patient, No Pcp Per Pharmacy:    [id number] [place], [place] - [address] AT [facility] [address] [place] [address] Phone: [phone] Fax: [phone]     Social Drivers of Health (SDOH) Social History: SDOH Screenings   Food Insecurity: No Food Insecurity (03/19/2024)  Housing: Low Risk  (03/19/2024)  Transportation Needs: No Transportation Needs (03/19/2024)  Utilities: Not At Risk (03/19/2024)  Alcohol Screen: Low Risk  (07/12/2023)  Financial Resource Strain: Low Risk  (07/12/2023)  Tobacco Use: High Risk (01/28/2024)   SDOH Interventions: None indicated     Readmission Risk Interventions     No data to display          Signed: Heather Saltness, MSW, LCSW Clinical Social Worker Inpatient Care Management 03/19/2024 2:45 PM

## 2024-03-19 NOTE — Care Management Obs Status (Signed)
 MEDICARE OBSERVATION STATUS NOTIFICATION   Patient Details  Name: [name] MRN: [id number] Date of Birth: 1965-11-21   Medicare Observation Status Notification Given:  Yes    Shep Porter A Lendy Dittrich, LCSW 03/19/2024, 2:22 PM

## 2024-03-19 NOTE — Consult Note (Addendum)
 Cardiology Consultation:  Patient ID: [name] MRN: [id number]; DOB: [birth year] Admit date: 03/18/2024 Date of Consult: 03/19/2024  Primary Care Provider: Patient, No Pcp Per Primary Cardiologist: None  Primary Electrophysiologist:  None   Patient Profile:  [name] is a 58 y.o. male with a hx of HFrEF (EF 28% 8/25), unclear etiology but suspected cocaine vs. HTN induced vs. ischemia, CAD s/p PCI x 2 (2003) who is being seen today for the evaluation of CP and troponin elevation at the request of Dr. Harlene Bowl.  History of Present Illness:  [name] presents with CP and dyspnea. He reports doing cocaine that was likely laced with fentanyl and started having right sided CP and dyspnea that lasted about a day. He denies any dyspnea, orthopnea, PND or LE edema and has not been taking his medications.   Past Medical History: CAD s/p PCI x 2 (2003), CVA, HFrEF (EF 28% 8/25, Diagnosed 12/24)  Past Surgical History: Noncontributory  Allergies:    Allergies  Allergen Reactions   Ace Inhibitors Swelling   Penicillins Swelling    Throat and eye swelling Has patient had a PCN reaction causing immediate rash, facial/tongue/throat swelling, SOB or lightheadedness with hypotension: Yes Has patient had a PCN reaction causing severe rash involving mucus membranes or skin necrosis: No Has patient had a PCN reaction that required hospitalization: No Has patient had a PCN reaction occurring within the last 10 years: No If all of the above answers are NO, then may proceed with Cephalosporin use.    Social History: cocaine and tobacco use  Family History:   Noncontributory  ROS:  All other ROS reviewed and negative. Pertinent positives noted in the HPI.     Physical Exam/Data:   Vitals:   03/18/24 1636 03/18/24 1822 03/18/24 2112 03/19/24 0555  BP: (!) 145/124 (!) 153/101 (!) 160/112 (!) 152/102  Pulse: 87 78 71 70  Resp: 14 18 18 16   Temp: 98.5 F (36.9 C) 98.2 F (36.8  C) 98.1 F (36.7 C) 98.3 F (36.8 C)  TempSrc: Oral     SpO2: 100% 100% 100% 100%  Weight:      Height:       No intake or output data in the 24 hours ending 03/19/24 0618     03/18/2024   12:03 PM 01/30/2024    3:10 AM 01/29/2024    4:27 AM  Last 3 Weights  Weight (lbs) 145 lb 146 lb 9.6 oz 145 lb 8.1 oz  Weight (kg) 65.772 kg 66.497 kg 66 kg    Body mass index is 18.62 kg/m.  General: Well nourished, well developed, in no acute distress HEENT: Atraumatic, no JVD Cardiac: Normal S1, S2; RRR; no murmurs, rubs, or gallops Lungs:CTAB, no wheezing, rhonchi or rales  Abd: Soft, nontender, no hepatomegaly  Ext: No edema, pulses 2+ Skin: Warm and dry, no rashes     Relevant CV Studies: Trop/BNP: 55, 51 TTE 01/2024: 28%, Mildly dilated LV, mod LVH, mod-severe biatrial enlargment LHC/RHC: None NST: None Assessment and Plan:  [name] is a 58 y.o. male with a hx of HFrEF (EF 28% 8/25), unclear etiology but suspected cocaine vs. HTN induced vs. ischemia, CAD s/p PCI x 2 (2003) who is being seen today for the evaluation of CP and troponin elevation at the request of Dr. Harlene Bowl.  #Troponin elevation #CP and dyspnea #HFrEF (EF 28%) #CAD s/p PCI #AKI o #CVA - Presenting with CP and dyspnea following cocaine use and  in the setting of not been taking his medications. BP on admission 160/110s and labs significant for trop 55, 51 and UDS positive for cocaine. ECG with STE in v3 and STD in inferolateral walls and LVH.  - Troponin and ECG is likely due to demand in setting of cocaine use; Repeat ECG looks closer to baseline - Given uncontrolled HTN, would increase amlo to 10 and hydral to 100--> can start losartan 25 at discharge - Repeat limited TTE to assess for change in EF - Plan for outpatient ischemic eval with PET stress test  For questions or updates, please contact [facility] Please consult [email or web address] for contact info under    Signed, Joelle DEL. Ren Ny, MD, [facility]  [facility]  03/19/2024 6:18 AM

## 2024-03-19 NOTE — Progress Notes (Signed)
 Pt had two episodes of run of VT-first 6 beats and second 3 beats.pt asymptomatic,VS stable.NP notified.

## 2024-03-20 ENCOUNTER — Telehealth: Payer: Self-pay | Admitting: Physician Assistant

## 2024-03-20 ENCOUNTER — Other Ambulatory Visit (HOSPITAL_COMMUNITY): Payer: Self-pay

## 2024-03-20 ENCOUNTER — Encounter (HOSPITAL_COMMUNITY): Payer: Self-pay | Admitting: Internal Medicine

## 2024-03-20 ENCOUNTER — Observation Stay (HOSPITAL_BASED_OUTPATIENT_CLINIC_OR_DEPARTMENT_OTHER)

## 2024-03-20 DIAGNOSIS — I219 Acute myocardial infarction, unspecified: Secondary | ICD-10-CM

## 2024-03-20 DIAGNOSIS — I259 Chronic ischemic heart disease, unspecified: Secondary | ICD-10-CM

## 2024-03-20 DIAGNOSIS — I428 Other cardiomyopathies: Secondary | ICD-10-CM

## 2024-03-20 DIAGNOSIS — N1831 Chronic kidney disease, stage 3a: Secondary | ICD-10-CM

## 2024-03-20 DIAGNOSIS — R7989 Other specified abnormal findings of blood chemistry: Secondary | ICD-10-CM

## 2024-03-20 DIAGNOSIS — F1410 Cocaine abuse, uncomplicated: Secondary | ICD-10-CM | POA: Diagnosis not present

## 2024-03-20 DIAGNOSIS — R079 Chest pain, unspecified: Secondary | ICD-10-CM | POA: Diagnosis not present

## 2024-03-20 DIAGNOSIS — I214 Non-ST elevation (NSTEMI) myocardial infarction: Secondary | ICD-10-CM | POA: Diagnosis not present

## 2024-03-20 DIAGNOSIS — I5041 Acute combined systolic (congestive) and diastolic (congestive) heart failure: Secondary | ICD-10-CM

## 2024-03-20 LAB — ECHOCARDIOGRAM LIMITED
Area-P 1/2: 3.24 cm2
Calc EF: 41.6 %
Height: 74 in
S' Lateral: 5.1 cm
Single Plane A2C EF: 45.8 %
Single Plane A4C EF: 33.5 %
Weight: 2320 [oz_av]

## 2024-03-20 MED ORDER — LOSARTAN POTASSIUM 50 MG PO TABS
50.0000 mg | ORAL_TABLET | Freq: Every day | ORAL | 0 refills | Status: DC
  Filled 2024-03-20: qty 30, 30d supply, fill #0

## 2024-03-20 MED ORDER — TAMSULOSIN HCL 0.4 MG PO CAPS: 0.4000 mg | ORAL_CAPSULE | Freq: Every day | ORAL | 3 refills | Status: DC

## 2024-03-20 MED ORDER — FUROSEMIDE 40 MG PO TABS: 40.0000 mg | ORAL_TABLET | Freq: Every day | ORAL | Status: DC

## 2024-03-20 MED ORDER — SPIRONOLACTONE 25 MG PO TABS
25.0000 mg | ORAL_TABLET | Freq: Every day | ORAL | 1 refills | Status: DC
  Filled 2024-03-20: qty 15, 15d supply, fill #0

## 2024-03-20 MED ORDER — PERFLUTREN LIPID MICROSPHERE
1.0000 mL | INTRAVENOUS | Status: AC | PRN
  Administered 2024-03-20: 2 mL via INTRAVENOUS

## 2024-03-20 MED ORDER — AMLODIPINE BESYLATE 10 MG PO TABS
10.0000 mg | ORAL_TABLET | Freq: Every day | ORAL | 0 refills | Status: DC
  Filled 2024-03-20: qty 30, 30d supply, fill #0

## 2024-03-20 NOTE — Telephone Encounter (Signed)
 Hi [place] team, this is a patient of Dr. Edwyna that Dr. Francyne with our team saw in the hospital today. Dr. Francyne recommends to please arrange an outpatient cardiac PET stress test for chest pain and elevated troponin. Can you please help place order under him and arrange/call patient with instructions? He will be documenting consent in his note today. I have placed [id number] order. I also arranged a follow-up visit with Delon Hoover on 10/17 which I relayed to patient on discharge instructions- we know it is unlikely the cardiac PET may be completed before that OV, but want to keep for blood pressure follow-up.   Appreciate your help! Asucena Galer, PA-C with our [facility] team

## 2024-03-20 NOTE — Progress Notes (Signed)
 AVS reviewed w/ patient who verbalized an understanding. Pt removed self from tele prior to nurse going into room. Central tele updated via call to unit. Pt dressed for d/c to home. PIV removed as noted. Discharge meds in  a secure bag delivered to patient in room by this RN Pt [facility] [place] as he does not have any at home. Request sent to Dr Juvenal via secure chat. Pt to ED entrance via w/c - home w/ mother.

## 2024-03-20 NOTE — Consult Note (Signed)
 Psych consult for Patient wants to go to [facility].   The patient was seen for an initial psychiatric consultation and expressed interest in voluntary admission to a behavioral health hospital for a 30-day residential substance use treatment program. He is not interested in a 3-day stay and is seeking ongoing counseling to address his substance use. The patient demonstrates insight into his problem, acknowledging his chronic use of cocaine. He reported a recent encounter with cocaine laced with fentanyl, which he found unpleasant, and he now wants to stop using. He is afraid he will continue using substances without help and is consenting to voluntary admission for substance use rehabilitation. The patient denies any current safety concerns, suicidal thoughts, ideation, or behaviors. His psychiatric history is minimal, with no prior suicide attempts or significant mental health issues. On assessment, there were no signs of mania or depression noted. He appeared alert, sitting up, and smiling during the evaluation. The plan is to consult Transitions of Care (TOC) for substance use resources and to discharge the consult. The primary team has been notified for further coordination of care.  Psych to sign off at this time.  Patient is an eligible candidate for [facility] if interested, please note it is a short admission as well but generally can bridge over until placement is sought.

## 2024-03-20 NOTE — TOC Transition Note (Addendum)
 Transition of Care [facility]) - Discharge Note   Patient Details  Name: [name] MRN: [id number] Date of Birth: 01-14-1966  Transition of Care [facility]) CM/SW Contact:  Bascom Service, RN Phone Number: 03/20/2024, 12:08 PM   Clinical Narrative: Patient has been provided w/inpt Substance use facilities list. Patient will contact them on own. Has own transport home or to Inpt Substance use facility if able to be accepted. Patient aware that he contacts the facility on his own. No further CM needs.  -2:53p faxed w/confirmation to [facility] for inpt substance use for patient-they will contact patient about the intake process. MD updated.  4p-Received call from [facility]-unable to accept d/t increased medical needs. Referral to [facility] [address] [place]-on d/c instructions. Patient aware.MD updated.    Final next level of care: Home/Self Care Barriers to Discharge: No Barriers Identified   Patient Goals and CMS Choice Patient states their goals for this hospitalization and ongoing recovery are:: Home CMS Medicare.gov Compare Post Acute Care list provided to:: Patient Choice offered to / list presented to : Patient [facility] ownership interest in [facility].provided to:: Patient    Discharge Placement                       Discharge Plan and Services Additional resources added to the After Visit Summary for   In-house Referral: Clinical Social Work Discharge Planning Services: NA Post Acute Care Choice: NA          DME Arranged: N/A DME Agency: NA       HH Arranged: NA HH Agency: NA        Social Drivers of Health (SDOH) Interventions SDOH Screenings   Food Insecurity: No Food Insecurity (03/19/2024)  Housing: Low Risk  (03/19/2024)  Transportation Needs: No Transportation Needs (03/19/2024)  Utilities: Not At Risk (03/19/2024)  Alcohol Screen: Low Risk  (07/12/2023)  Financial Resource Strain: Low Risk  (07/12/2023)  Tobacco Use: High Risk (03/20/2024)      Readmission Risk Interventions     No data to display

## 2024-03-20 NOTE — Discharge Summary (Signed)
 Physician Discharge Summary  [name] FMW:[id number] DOB: [birth year] DOA: 03/18/2024  PCP: Patient, No Pcp Per  Admit date: 03/18/2024 Discharge date: 03/20/2024  Admitted From:  Discharge disposition: Home   Recommendations for Outpatient Follow-Up:   Cocaine cessation Patient to arrange for rehab Needs medication compliance Close follow-up with cardiology: Medication Recommendations:   Losartan 50 mg once daily (new) Amlodipine  10 mg once daily Spironolactone  25 mg once daily Furosemide  40 mg daily Aspirin  81 mg daily Jardiance  10 mg daily Atorvastatin 40 mg daily Stop hydralazine  Other recommendations (labs, testing, etc): BMET in roughly 2 weeks.  Will schedule for outpatient cardiac PET scan.  Discharge Diagnosis:   Principal Problem:   Chest pain    Discharge Condition: Improved.  Diet recommendation: Low sodium, heart healthy.   Wound care: None.  Code status: Full.   History of Present Illness:    [name] is a 58 y.o. male with medical history significant of coronary artery disease with prior history of 2 stents.  Patient comes to the ER complaining of chest pain after using bad drugs for the disease patient states that when he was using crack cocaine and thinks that someone put fentanyl in it.  Patient says that some of his chest pain has stopped but he still having some chest discomfort.  He is short of breath but states he is always short of breath.  Patient also states he has not been taking any of his home medications.  Currently his main complaint is urinary retention and feels like he needs to restart his Flomax .   In the ER, EKG was done that did have some changes compared to baseline.  Cardiology was consulted (Dr. Joelle) who plans to see the patient in the a.m.  They recommended serial troponins and aspirin  load.   Hospital Course by Problem:   Chest pain after Cocaine intoxication and medical non-compliance -encourage  cessation of cocaine -Echo shows no change in regional wall motion and overall ejection fraction when compared to previous studies send suspect that he had demand infarction following cocaine use.  Cards consult and recommendations as above   BPH -flomax       Medical Consultants:   Cardiology Psych   Discharge Exam:   Vitals:   03/20/24 0632 03/20/24 0900  BP: (!) 166/97 (!) 164/95  Pulse:    Resp:    Temp:    SpO2:     Vitals:   03/20/24 0035 03/20/24 0535 03/20/24 0632 03/20/24 0900  BP: (!) 146/86 (!) 164/109 (!) 166/97 (!) 164/95  Pulse: 79 63    Resp:  18    Temp:  99 F (37.2 C)    TempSrc:      SpO2:  97%    Weight:      Height:        General exam: Appears calm and comfortable.   The results of significant diagnostics from this hospitalization (including imaging, microbiology, ancillary and laboratory) are listed below for reference.     Procedures and Diagnostic Studies:   DG Chest Portable 2 Views Result Date: 03/18/2024 CLINICAL DATA:  SOB EXAM: CHEST  2 VIEW PORTABLE COMPARISON:  Chest x-ray 01/27/2024 FINDINGS: The heart and mediastinal contours are within normal limits. Coronary artery stent. No focal consolidation. No pulmonary edema. No pleural effusion. No pneumothorax. No acute osseous abnormality. IMPRESSION: No active cardiopulmonary disease. Electronically Signed   By: Morgane  Naveau M.D.   On: 03/18/2024 12:52  Labs:   Basic Metabolic Panel: Recent Labs  Lab 03/18/24 1250 03/19/24 0606  NA 137 140  K 3.6 3.6  CL 103 105  CO2 21* 23  GLUCOSE 131* 101*  BUN 26* 34*  CREATININE 1.55* 1.40*  CALCIUM  9.8 9.1  MG  --  2.1  PHOS  --  3.8   GFR Estimated Creatinine Clearance: 53.5 mL/min (A) (by C-G formula based on SCr of 1.4 mg/dL (H)). Liver Function Tests: Recent Labs  Lab 03/18/24 1250  AST 26  ALT 7  ALKPHOS 112  BILITOT 0.6  PROT 7.8  ALBUMIN 4.1   No results for input(s): LIPASE, AMYLASE in the last 168  hours. No results for input(s): AMMONIA in the last 168 hours. Coagulation profile No results for input(s): INR, PROTIME in the last 168 hours.  CBC: Recent Labs  Lab 03/18/24 1250  WBC 8.8  HGB 16.0  HCT 46.8  MCV 89.8  PLT 269   Cardiac Enzymes: No results for input(s): CKTOTAL, CKMB, CKMBINDEX, TROPONINI in the last 168 hours. BNP: Invalid input(s): POCBNP CBG: No results for input(s): GLUCAP in the last 168 hours. D-Dimer No results for input(s): DDIMER in the last 72 hours. Hgb A1c No results for input(s): HGBA1C in the last 72 hours. Lipid Profile No results for input(s): CHOL, HDL, LDLCALC, TRIG, CHOLHDL, LDLDIRECT in the last 72 hours. Thyroid function studies No results for input(s): TSH, T4TOTAL, T3FREE, THYROIDAB in the last 72 hours.  Invalid input(s): FREET3 Anemia work up No results for input(s): VITAMINB12, FOLATE, FERRITIN, TIBC, IRON, RETICCTPCT in the last 72 hours. Microbiology No results found for this or any previous visit (from the past 240 hours).   Discharge Instructions:   Discharge Instructions     Diet - low sodium heart healthy   Complete by: As directed    Discharge instructions   Complete by: As directed    BMET in roughly 2 weeks.   outpatient cardiac PET scan: Delon Hoover, NP 10/17   Increase activity slowly   Complete by: As directed       Allergies as of 03/20/2024       Reactions   Ace Inhibitors Swelling   Penicillins Swelling   Throat and eye swelling Has patient had a PCN reaction causing immediate rash, facial/tongue/throat swelling, SOB or lightheadedness with hypotension: Yes Has patient had a PCN reaction causing severe rash involving mucus membranes or skin necrosis: No Has patient had a PCN reaction that required hospitalization: No Has patient had a PCN reaction occurring within the last 10 years: No If all of the above answers are NO, then may proceed  with Cephalosporin use.        Medication List     STOP taking these medications    carvedilol  12.5 MG tablet Commonly known as: COREG    carvedilol  6.25 MG tablet Commonly known as: COREG    Entresto 49-51 MG Generic drug: sacubitril-valsartan   hydrALAZINE  50 MG tablet Commonly known as: APRESOLINE    nitroGLYCERIN  0.4 MG SL tablet Commonly known as: NITROSTAT        TAKE these medications    albuterol  108 (90 Base) MCG/ACT inhaler Commonly known as: VENTOLIN  HFA Inhale 2 puffs into the lungs every 4 (four) hours as needed for wheezing.   amLODipine  10 MG tablet Commonly known as: NORVASC  Take 1 tablet (10 mg total) by mouth daily. Start taking on: March 21, 2024   aspirin  EC 81 MG tablet Chew 81 mg by mouth daily.  blood glucose meter kit and supplies Dispense based on patient and insurance preference. Use up to four times daily as directed. (FOR ICD-10 E10.9, E11.9).   empagliflozin  10 MG Tabs tablet Commonly known as: JARDIANCE  Take 1 tablet (10 mg total) by mouth daily.   finasteride  5 MG tablet Commonly known as: PROSCAR  Take 5 mg by mouth daily.   furosemide  40 MG tablet Commonly known as: LASIX  Take 1 tablet (40 mg total) by mouth daily. What changed: when to take this   losartan 50 MG tablet Commonly known as: Cozaar Take 1 tablet (50 mg total) by mouth daily.   rosuvastatin  40 MG tablet Commonly known as: CRESTOR  Take 1 tablet (40 mg total) by mouth daily.   spironolactone  25 MG tablet Commonly known as: ALDACTONE  Take 1 tablet (25 mg total) by mouth daily. What changed: how much to take   tamsulosin  0.4 MG Caps capsule Commonly known as: FLOMAX  Take 1 capsule (0.4 mg total) by mouth daily.        Follow-up Information     Carlin Delon BROCKS, NP Follow up.   Specialty: Cardiology Why: [facility] - [place] location - cardiology follow-up arranged on Friday Mar 31, 2024 10:05 AM. Lucrezia 15 minutes prior to appointment to  check in. The cardiology office will also call you to arrange an outpatient stress test. Contact information: [address] [place] [address] [phone]         [facility]. Go to.   Contact information: [address] [place]  [address] [phone]                 Time coordinating discharge: 45 min  Signed:  Harlene RAYMOND Bowl DO  [facility] 03/20/2024, 4:13 PM

## 2024-03-20 NOTE — Progress Notes (Addendum)
 Progress Note  Patient Name: [name] Date of Encounter: 03/20/2024 [facility] Cardiologist: None   Interval Summary   He is not experiencing any chest pain or shortness of breath. Blood pressure remains high.  Vital Signs Vitals:   03/20/24 0035 03/20/24 0535 03/20/24 0632 03/20/24 0900  BP: (!) 146/86 (!) 164/109 (!) 166/97 (!) 164/95  Pulse: 79 63    Resp:  18    Temp:  99 F (37.2 C)    TempSrc:      SpO2:  97%    Weight:      Height:        Intake/Output Summary (Last 24 hours) at 03/20/2024 1417 Last data filed at 03/20/2024 1243 Gross per 24 hour  Intake 720 ml  Output --  Net 720 ml      03/18/2024   12:03 PM 01/30/2024    3:10 AM 01/29/2024    4:27 AM  Last 3 Weights  Weight (lbs) 145 lb 146 lb 9.6 oz 145 lb 8.1 oz  Weight (kg) 65.772 kg 66.497 kg 66 kg      Telemetry/ECG  Sinus rhythm with occasional PVCs, rare episodes of brief nonsustained VT max 7 beats- Personally Reviewed  Reviewed echocardiograms performed during this admission, during the August admission and in January of this year.  All of them show very similar pattern of mid basal lateral wall hypokinesis consistent with old infarction and moderately depressed overall LV function with ejection fraction around 35%.  I do not think there is any change between the 3 studies.  The current study shows no evidence of fluid overload.  He reports that he had a myocardial infarction and received 2 stents in 2003 and [place].  Physical Exam  GEN: No acute distress.   Neck: No JVD Cardiac: RRR with rare ectopy, no murmurs mor rubs, S4 gallop is present respiratory: Clear to auscultation bilaterally. GI: Soft, nontender, non-distended  MS: No edema  Assessment & Plan  58 year old man with history of CAD myocardial infarction, history of repeated cocaine use/abuse, HTN, chronic HFrEF, admitted with chest pain and mild troponin elevation shortly after consuming cocaine.  Echo  shows no change in regional wall motion and overall ejection fraction when compared to previous studies send suspect that he had demand infarction following cocaine use.  He repeatedly points out that he does not want to leave the hospital without a plan for drug rehabilitation, otherwise he will end up using drugs again and land back in the hospital.  He states that he is previously been in a rehab program then that it helped.  He has tried calling several rehab programs in the area, but reports that nobody will answer the phone.  Blood pressure remains elevated.  His kidney function appears to be back to baseline with a creatinine around 1.4 (GFR 55-60).  Rather than using hydralazine  (with which I doubt he can be compliant 3 times daily), prefer to use angiotensin receptor blocker.  If he is compliant with follow-up eventually transition him to [facility]).  Recommend starting losartan 50 mg once daily and resuming his spironolactone , recheck a basic metabolic panel in a couple of weeks.  Informed Consent   Shared Decision Making/Informed Consent The risks [chest pain, shortness of breath, cardiac arrhythmias, dizziness, blood pressure fluctuations, myocardial infarction, stroke/transient ischemic attack, nausea, vomiting, allergic reaction, radiation exposure, metallic taste sensation and life-threatening complications (estimated to be 1 in 10,000)], benefits (risk stratification, diagnosing coronary artery disease, treatment guidance) and alternatives  of a cardiac PET stress test were discussed in detail with Mr. Candelas and he agrees to proceed.      [facility] will sign off.   The patient is ready for discharge today from a cardiac standpoint. Medication Recommendations:   Losartan 50 mg once daily (new) Amlodipine  10 mg once daily Spironolactone  25 mg once daily Furosemide  40 mg daily Aspirin  81 mg daily Jardiance  10 mg daily Atorvastatin 40 mg daily Stop hydralazine  Other  recommendations (labs, testing, etc): BMET in roughly 2 weeks.  Will schedule for outpatient cardiac PET scan. Follow up as an outpatient:  Delon Hoover, NP 10/17  For questions or updates, please contact [facility] Please consult [email or web address] for contact info under         Signed, Jerel Balding, MD

## 2024-03-21 ENCOUNTER — Encounter (HOSPITAL_COMMUNITY): Payer: Self-pay

## 2024-03-21 NOTE — Telephone Encounter (Signed)
 Order placed

## 2024-03-22 NOTE — Telephone Encounter (Signed)
 Pt aware of test and instructions. Pt verbalized understanding and had no additional questions.

## 2024-03-23 ENCOUNTER — Encounter: Admission: RE | Admit: 2024-03-23 | Source: Ambulatory Visit

## 2024-03-24 DIAGNOSIS — I252 Old myocardial infarction: Secondary | ICD-10-CM | POA: Diagnosis not present

## 2024-03-24 DIAGNOSIS — R0789 Other chest pain: Secondary | ICD-10-CM | POA: Diagnosis not present

## 2024-03-24 DIAGNOSIS — Z5900 Homelessness unspecified: Secondary | ICD-10-CM | POA: Diagnosis not present

## 2024-03-24 DIAGNOSIS — Z79899 Other long term (current) drug therapy: Secondary | ICD-10-CM | POA: Diagnosis not present

## 2024-03-24 DIAGNOSIS — Z955 Presence of coronary angioplasty implant and graft: Secondary | ICD-10-CM | POA: Diagnosis not present

## 2024-03-26 DIAGNOSIS — I491 Atrial premature depolarization: Secondary | ICD-10-CM | POA: Diagnosis not present

## 2024-03-28 DIAGNOSIS — I10 Essential (primary) hypertension: Secondary | ICD-10-CM | POA: Insufficient documentation

## 2024-03-28 DIAGNOSIS — Z8673 Personal history of transient ischemic attack (TIA), and cerebral infarction without residual deficits: Secondary | ICD-10-CM | POA: Insufficient documentation

## 2024-03-28 NOTE — Progress Notes (Deleted)
 Cardiology Office Note:  .   Date:  03/28/2024  ID:  [name], DOB 06-Mar-1966, MRN [id number] PCP: Patient, No Pcp Per  [facility] Providers Cardiologist:  None    History of Present Illness: .   [name] is a 58 y.o. male with a past medical history of CAD with history of MI s/p DES in 2003, CVA, hypertension, GERD, hepatitis C, bipolar disorder, dyslipidemia, bradycardia, tobacco abuse.  2003 MI s/p DES--unknown number of stents or location, no results available for review  He established care with Dr. Edwyna on 03/19/2021, he had some DOE which could have been an anginal equivalent so the recommendation was for stress evaluation however it does not appear this was completed.  An echocardiogram was also recommended however this was not completed, he was advised to follow-up in 6 months however it appears he was lost to follow-up.  Since then, he has been evaluated twice in the emergency department within the last month for evaluation of shortness of breath--proBNP elevated greater than 26,000, it, given IV Lasix , left AMA and advised to follow-up with cardiology.  Most recently evaluated by myself on 05/28/2023, he was feeling poorly, blood pressure was elevated and he was edematous, we arranged for repeat echo, Lexiscan--it appears he did not show up for either of the studies, started him on Lasix  and made plans to follow-up in 4 weeks, again he did not return for his follow-up visit.  Since last evaluated in our office he has had multiple ED/admissions for chest pain in January 2025, August 2025, 2 episodes in October 2025:  03/24/2024-[facility] ED, complaints of chest pain after ingesting cocaine, EKG without changes, troponins mildly elevated at 30 and 41 03/18/2024--admission [facility], chest pain after using  bad drugs, echo unchanged, transitioned to [facility] with plans to transition to [facility] outpatient if compliant, recommendations for outpatient cardiac PET 01/27/2024  to 01/30/2024--admission [facility], for flash pulmonary edema, BNP greater than 4500, systolic blood pressure greater than 200, placed on BiPAP, troponins flat, diuresed   Cardiac PET, BMET, transition medications to [address] for pill packing   ROS: Review of Systems  Respiratory:  Positive for shortness of breath.   Cardiovascular:  Positive for leg swelling.  Neurological:  Positive for tingling.  All other systems reviewed and are negative.    Studies Reviewed: .       Cardiac Studies & Procedures   ______________________________________________________________________________________________     ECHOCARDIOGRAM  ECHOCARDIOGRAM LIMITED 03/20/2024  Narrative ECHOCARDIOGRAM LIMITED REPORT    Patient Name:   [name] Date of Exam: 03/20/2024 Medical Rec #:  [id number]        Height:       74.0 in Accession #:    [id number]       Weight:       145.0 lb Date of Birth:  September 14, 1965         BSA:          1.896 m Patient Age:    58 years         BP:           160/97 mmHg Patient Gender: M                HR:           59 bpm. Exam Location:  Inpatient  Procedure: Limited Echo, Color Doppler, Cardiac Doppler and Intracardiac Opacification Agent (Both Spectral and Color Flow Doppler were utilized during procedure).  Indications:  Cardiomyopathy  History:        Patient has prior history of Echocardiogram examinations. Cardiomyopathy.  Sonographer:    Norleen Amour Referring Phys: [id number] FRANCK H AZOBOU TONLEU  IMPRESSIONS   1. Left ventricular ejection fraction, by estimation, is 30 to 35%. The left ventricle has moderately decreased function. The left ventricle demonstrates regional wall motion abnormalities (see scoring diagram/findings for description). The left ventricular internal cavity size was moderately dilated. Left ventricular diastolic parameters are consistent with Grade I diastolic dysfunction (impaired relaxation). 2. The mitral valve is grossly  normal. Mild mitral valve regurgitation. 3. The aortic valve is tricuspid. There is mild calcification of the aortic valve. Aortic valve regurgitation is not visualized. 4. There is normal pulmonary artery systolic pressure. The estimated right ventricular systolic pressure is 13.1 mmHg.  FINDINGS Left Ventricle: Left ventricular ejection fraction, by estimation, is 30 to 35%. The left ventricle has moderately decreased function. The left ventricle demonstrates regional wall motion abnormalities. Definity  contrast agent was given IV to delineate the left ventricular endocardial borders. The left ventricular internal cavity size was moderately dilated. Left ventricular diastolic parameters are consistent with Grade I diastolic dysfunction (impaired relaxation).   LV Wall Scoring: The antero-lateral wall and mid and distal lateral wall are akinetic.  Right Ventricle: There is normal pulmonary artery systolic pressure. The tricuspid regurgitant velocity is 1.59 m/s, and with an assumed right atrial pressure of 3 mmHg, the estimated right ventricular systolic pressure is 13.1 mmHg.  Mitral Valve: The mitral valve is grossly normal. There is mild thickening of the mitral valve leaflet(s). Mild mitral valve regurgitation.  Tricuspid Valve: The tricuspid valve is normal in structure. Tricuspid valve regurgitation is trivial.  Aortic Valve: The aortic valve is tricuspid. There is mild calcification of the aortic valve. Aortic valve regurgitation is not visualized.  Pulmonic Valve: The pulmonic valve was not well visualized. Pulmonic valve regurgitation is not visualized.  LEFT VENTRICLE PLAX 2D LVIDd:         6.00 cm      Diastology LVIDs:         5.10 cm      LV e' medial:    3.70 cm/s LV PW:         1.20 cm      LV E/e' medial:  20.1 LV IVS:        1.10 cm      LV e' lateral:   4.90 cm/s LV E/e' lateral: 15.2  LV Volumes (MOD) LV vol d, MOD A2C: 201.0 ml LV vol d, MOD A4C: 239.0 ml LV vol  s, MOD A2C: 109.0 ml LV vol s, MOD A4C: 159.0 ml LV SV MOD A2C:     92.0 ml LV SV MOD A4C:     239.0 ml LV SV MOD BP:      94.4 ml  RIGHT VENTRICLE             IVC RV S prime:     12.80 cm/s  IVC diam: 1.50 cm TAPSE (M-mode): 1.8 cm  LEFT ATRIUM         Index LA diam:    5.30 cm 2.80 cm/m MITRAL VALVE                TRICUSPID VALVE MV Area (PHT): 3.24 cm     TR Peak grad:   10.1 mmHg MV Decel Time: 234 msec     TR Vmax:        159.00 cm/s MV E  velocity: 74.40 cm/s MV A velocity: 122.00 cm/s MV E/A ratio:  0.61  Oneil Parchment MD Electronically signed by Oneil Parchment MD Signature Date/Time: 03/20/2024/1:17:41 PM    Final          ______________________________________________________________________________________________      Risk Assessment/Calculations:     No BP recorded.  {Refresh Note OR Click here to enter BP  :1}***       Physical Exam:   VS:  There were no vitals taken for this visit.   Wt Readings from Last 3 Encounters:  03/18/24 145 lb (65.8 kg)  01/30/24 146 lb 9.6 oz (66.5 kg)  07/22/23 167 lb 15.9 oz (76.2 kg)    GEN: Well nourished, well developed in no acute distress NECK: No JVD; No carotid bruits CARDIAC: RRR, no murmurs, rubs, gallops RESPIRATORY:  Clear to auscultation without rales, wheezing or rhonchi  ABDOMEN: Soft, non-tender, non-distended EXTREMITIES: +3 pitting edema to knees; No deformity   ASSESSMENT AND PLAN: .   CAD-s/p DES 2003--no records available for review, continue aspirin  81 mg daily, continue nitroglycerin  as needed, continue Crestor  20 mg daily.  He has having some DOE this is likely related to his volume overload, we will arrange for an ischemic evaluation though as it does not appear there has been one since his reported stent in 2003.  Beta-blocker previously been avoided secondary to cocaine abuse -- Will discuss this at next OV.  Pedal edema with recent pleural effusions-his legs are very edematous, NYHA class II-III  -we have no echo on file available for review, Will arrange for an echocardiogram.  Creatinine was 1.20 and potassium 3.9 on 05/23/2023.  Start Lasix  40 mg once daily.  Repeat BMET and proBNP.  Hypertension-blood pressure is very elevated in the office today however is not yet taken his antihypertensive medications and he was in a rush to get here today, continue Norvasc  10 mg daily, continue clonidine  0.2 mg twice daily, continue HCTZ 25 mg daily.  Repeating BMET and will make further recommendations as blood pressure at that time.  Dyslipidemia-most recent LDL on file for review is elevated at 91 2023, currently on Crestor  20 mg daily--will address this at next OV.  Tobacco abuse-will address at next OV.  Encounter for screening for DM  -mentions that he thinks he has diabetes, does not appear that has been evaluated for this, will check his A1c.    Informed Consent   Shared Decision Making/Informed Consent The risks [chest pain, shortness of breath, cardiac arrhythmias, dizziness, blood pressure fluctuations, myocardial infarction, stroke/transient ischemic attack, nausea, vomiting, allergic reaction, radiation exposure, metallic taste sensation and life-threatening complications (estimated to be 1 in 10,000)], benefits (risk stratification, diagnosing coronary artery disease, treatment guidance) and alternatives of a nuclear stress test were discussed in detail with Mr. Harkins and he agrees to proceed.     Dispo: Echo, Lexiscan, BMET, proBNP, A1c, start Lasix  40 mg daily.  Return in 4 weeks  Signed, Delon JAYSON Hoover, NP

## 2024-03-30 ENCOUNTER — Emergency Department (HOSPITAL_COMMUNITY)
Admission: EM | Admit: 2024-03-30 | Discharge: 2024-03-31 | Disposition: A | Discharge status: Home / Self Care | Attending: Emergency Medicine | Admitting: Emergency Medicine

## 2024-03-30 ENCOUNTER — Other Ambulatory Visit: Payer: Self-pay

## 2024-03-30 DIAGNOSIS — Z5900 Homelessness unspecified: Secondary | ICD-10-CM | POA: Diagnosis not present

## 2024-03-30 DIAGNOSIS — R519 Headache, unspecified: Secondary | ICD-10-CM | POA: Diagnosis present

## 2024-03-30 DIAGNOSIS — I10 Essential (primary) hypertension: Secondary | ICD-10-CM | POA: Insufficient documentation

## 2024-03-30 DIAGNOSIS — Z765 Malingerer [conscious simulation]: Secondary | ICD-10-CM | POA: Diagnosis not present

## 2024-03-30 DIAGNOSIS — Z7982 Long term (current) use of aspirin: Secondary | ICD-10-CM | POA: Diagnosis not present

## 2024-03-30 DIAGNOSIS — R0602 Shortness of breath: Secondary | ICD-10-CM | POA: Diagnosis not present

## 2024-03-30 DIAGNOSIS — Z79899 Other long term (current) drug therapy: Secondary | ICD-10-CM | POA: Diagnosis not present

## 2024-03-30 LAB — CBC WITH DIFFERENTIAL/PLATELET
Abs Immature Granulocytes: 0.01 K/uL (ref 0.00–0.07)
Basophils Absolute: 0 K/uL (ref 0.0–0.1)
Basophils Relative: 1 %
Eosinophils Absolute: 0.3 K/uL (ref 0.0–0.5)
Eosinophils Relative: 4 %
HCT: 39.5 % (ref 39.0–52.0)
Hemoglobin: 13.2 g/dL (ref 13.0–17.0)
Immature Granulocytes: 0 %
Lymphocytes Relative: 27 %
Lymphs Abs: 1.7 K/uL (ref 0.7–4.0)
MCH: 30.8 pg (ref 26.0–34.0)
MCHC: 33.4 g/dL (ref 30.0–36.0)
MCV: 92.3 fL (ref 80.0–100.0)
Monocytes Absolute: 0.7 K/uL (ref 0.1–1.0)
Monocytes Relative: 10 %
Neutro Abs: 3.6 K/uL (ref 1.7–7.7)
Neutrophils Relative %: 58 %
Platelets: 231 K/uL (ref 150–400)
RBC: 4.28 MIL/uL (ref 4.22–5.81)
RDW: 14.1 % (ref 11.5–15.5)
WBC: 6.3 K/uL (ref 4.0–10.5)
nRBC: 0 % (ref 0.0–0.2)

## 2024-03-30 LAB — COMPREHENSIVE METABOLIC PANEL WITH GFR
ALT: 10 U/L (ref 0–44)
AST: 20 U/L (ref 15–41)
Albumin: 3.3 g/dL — ABNORMAL LOW (ref 3.5–5.0)
Alkaline Phosphatase: 85 U/L (ref 38–126)
Anion gap: 9 (ref 5–15)
BUN: 27 mg/dL — ABNORMAL HIGH (ref 6–20)
CO2: 21 mmol/L — ABNORMAL LOW (ref 22–32)
Calcium: 8.8 mg/dL — ABNORMAL LOW (ref 8.9–10.3)
Chloride: 108 mmol/L (ref 98–111)
Creatinine, Ser: 1.44 mg/dL — ABNORMAL HIGH (ref 0.61–1.24)
GFR, Estimated: 56 mL/min — ABNORMAL LOW (ref >60)
Glucose, Bld: 140 mg/dL — ABNORMAL HIGH (ref 70–99)
Potassium: 4.4 mmol/L (ref 3.5–5.1)
Sodium: 138 mmol/L (ref 135–145)
Total Bilirubin: 0.6 mg/dL (ref 0.0–1.2)
Total Protein: 6.6 g/dL (ref 6.5–8.1)

## 2024-03-30 NOTE — ED Triage Notes (Signed)
 Pt comes into the ed stating I need to be evaluated for a stroke this RN attempted a neuro exam and sort and pt VERY uncooperative. Pt taken back to triage

## 2024-03-30 NOTE — ED Notes (Addendum)
 Pt moving around a lot and being antsy during vitals being re-checked and Pt continues to pace lobby.

## 2024-03-30 NOTE — ED Notes (Signed)
 Pt requesting food and drink, pt made aware would need to be seen by provider first.

## 2024-03-30 NOTE — ED Triage Notes (Signed)
 Patient reports headache this week unrelieved by OTC medications, respirations unlabored , no neuro deficits , alert and oriented.

## 2024-03-31 ENCOUNTER — Emergency Department (HOSPITAL_COMMUNITY)

## 2024-03-31 ENCOUNTER — Emergency Department (HOSPITAL_COMMUNITY)
Admission: EM | Admit: 2024-03-31 | Discharge: 2024-03-31 | Disposition: A | Discharge status: Home / Self Care | Attending: Emergency Medicine | Admitting: Emergency Medicine

## 2024-03-31 ENCOUNTER — Encounter (HOSPITAL_COMMUNITY): Payer: Self-pay | Admitting: Psychiatry

## 2024-03-31 ENCOUNTER — Other Ambulatory Visit: Payer: Self-pay

## 2024-03-31 ENCOUNTER — Ambulatory Visit: Attending: Cardiology | Admitting: Cardiology

## 2024-03-31 DIAGNOSIS — I130 Hypertensive heart and chronic kidney disease with heart failure and stage 1 through stage 4 chronic kidney disease, or unspecified chronic kidney disease: Secondary | ICD-10-CM | POA: Insufficient documentation

## 2024-03-31 DIAGNOSIS — J45909 Unspecified asthma, uncomplicated: Secondary | ICD-10-CM | POA: Insufficient documentation

## 2024-03-31 DIAGNOSIS — Z5900 Homelessness unspecified: Secondary | ICD-10-CM | POA: Diagnosis not present

## 2024-03-31 DIAGNOSIS — I10 Essential (primary) hypertension: Secondary | ICD-10-CM | POA: Diagnosis not present

## 2024-03-31 DIAGNOSIS — I2510 Atherosclerotic heart disease of native coronary artery without angina pectoris: Secondary | ICD-10-CM

## 2024-03-31 DIAGNOSIS — Z7951 Long term (current) use of inhaled steroids: Secondary | ICD-10-CM | POA: Insufficient documentation

## 2024-03-31 DIAGNOSIS — R0602 Shortness of breath: Secondary | ICD-10-CM | POA: Diagnosis not present

## 2024-03-31 DIAGNOSIS — I639 Cerebral infarction, unspecified: Secondary | ICD-10-CM

## 2024-03-31 DIAGNOSIS — E785 Hyperlipidemia, unspecified: Secondary | ICD-10-CM

## 2024-03-31 DIAGNOSIS — I5043 Acute on chronic combined systolic (congestive) and diastolic (congestive) heart failure: Secondary | ICD-10-CM | POA: Insufficient documentation

## 2024-03-31 DIAGNOSIS — Z79899 Other long term (current) drug therapy: Secondary | ICD-10-CM | POA: Insufficient documentation

## 2024-03-31 DIAGNOSIS — R519 Headache, unspecified: Secondary | ICD-10-CM | POA: Diagnosis not present

## 2024-03-31 DIAGNOSIS — R9082 White matter disease, unspecified: Secondary | ICD-10-CM | POA: Diagnosis not present

## 2024-03-31 DIAGNOSIS — E1122 Type 2 diabetes mellitus with diabetic chronic kidney disease: Secondary | ICD-10-CM | POA: Insufficient documentation

## 2024-03-31 DIAGNOSIS — N183 Chronic kidney disease, stage 3 unspecified: Secondary | ICD-10-CM | POA: Insufficient documentation

## 2024-03-31 DIAGNOSIS — Z8673 Personal history of transient ischemic attack (TIA), and cerebral infarction without residual deficits: Secondary | ICD-10-CM | POA: Insufficient documentation

## 2024-03-31 DIAGNOSIS — Z7982 Long term (current) use of aspirin: Secondary | ICD-10-CM | POA: Insufficient documentation

## 2024-03-31 DIAGNOSIS — Z765 Malingerer [conscious simulation]: Secondary | ICD-10-CM | POA: Diagnosis not present

## 2024-03-31 DIAGNOSIS — I517 Cardiomegaly: Secondary | ICD-10-CM | POA: Diagnosis not present

## 2024-03-31 DIAGNOSIS — F1410 Cocaine abuse, uncomplicated: Secondary | ICD-10-CM | POA: Insufficient documentation

## 2024-03-31 DIAGNOSIS — R451 Restlessness and agitation: Secondary | ICD-10-CM | POA: Insufficient documentation

## 2024-03-31 DIAGNOSIS — I672 Cerebral atherosclerosis: Secondary | ICD-10-CM | POA: Diagnosis not present

## 2024-03-31 LAB — TROPONIN I (HIGH SENSITIVITY)
Troponin I (High Sensitivity): 29 ng/L — ABNORMAL HIGH (ref <18)
Troponin I (High Sensitivity): 26 ng/L — ABNORMAL HIGH (ref <18)

## 2024-03-31 LAB — BRAIN NATRIURETIC PEPTIDE: B Natriuretic Peptide: 805.3 pg/mL — ABNORMAL HIGH (ref 0.0–100.0)

## 2024-03-31 MED ORDER — HYDRALAZINE HCL 20 MG/ML IJ SOLN
10.0000 mg | Freq: Once | INTRAMUSCULAR | Status: AC
  Administered 2024-03-31: 10 mg via INTRAVENOUS
  Filled 2024-03-31: qty 1

## 2024-03-31 NOTE — ED Triage Notes (Signed)
 Pt. Stated, I need to be admitted for in patient behavior health. I need to stay on my meds.

## 2024-03-31 NOTE — Discharge Instructions (Addendum)
 Please use resources below to seek additional help for management of your physical or mental health.

## 2024-03-31 NOTE — ED Provider Notes (Signed)
 [place] EMERGENCY DEPARTMENT AT [facility] Provider Note   CSN: [id number] Arrival date & time: 03/30/24  8144     Patient presents with: Headache and Shortness of Breath   [name] is a 58 y.o. male.   Presents to the emergency department with multiple complaints.  Patient complaining of headache.  He was noted to be hypertensive and triage, reports a history of hypertension, states that he has been taking his medications.  Patient also reports chest discomfort and shortness of breath in triage, did not report it upon my evaluation.       Prior to Admission medications   Medication Sig Start Date End Date Taking? Authorizing Provider  albuterol  (VENTOLIN  HFA) 108 (90 Base) MCG/ACT inhaler Inhale 2 puffs into the lungs every 4 (four) hours as needed for wheezing. 04/24/23   [provider]  amLODipine  (NORVASC ) 10 MG tablet Take 1 tablet (10 mg total) by mouth daily. 03/21/24   Juvenal Harlene PENNER, DO  aspirin  81 MG EC tablet Chew 81 mg by mouth daily.    [provider]  blood glucose meter kit and supplies Dispense based on patient and insurance preference. Use up to four times daily as directed. (FOR ICD-10 E10.9, E11.9). 07/22/23   Fairy Frames, MD  empagliflozin  (JARDIANCE ) 10 MG TABS tablet Take 1 tablet (10 mg total) by mouth daily. Patient not taking: Reported on 03/18/2024 07/23/23   Fairy Frames, MD  finasteride  (PROSCAR ) 5 MG tablet Take 5 mg by mouth daily. Patient not taking: Reported on 03/18/2024 01/25/21   [provider]  furosemide  (LASIX ) 40 MG tablet Take 1 tablet (40 mg total) by mouth daily. 03/20/24   Vann, Jessica U, DO  losartan (COZAAR) 50 MG tablet Take 1 tablet (50 mg total) by mouth daily. 03/20/24   Vann, Jessica U, DO  rosuvastatin  (CRESTOR ) 40 MG tablet Take 1 tablet (40 mg total) by mouth daily. Patient not taking: Reported on 03/18/2024 07/23/23   Fairy Frames, MD  spironolactone  (ALDACTONE ) 25 MG tablet Take  1 tablet (25 mg total) by mouth daily. 03/20/24   Vann, Jessica U, DO  tamsulosin  (FLOMAX ) 0.4 MG CAPS capsule Take 1 capsule (0.4 mg total) by mouth daily. 03/20/24   Vann, Jessica U, DO    Allergies: Ace inhibitors and Penicillins    Review of Systems  Updated Vital Signs BP (!) 158/89   Pulse 67   Temp 98.1 F (36.7 C) (Oral)   Resp 16   SpO2 100%   Physical Exam Vitals and nursing note reviewed.  Constitutional:      General: He is not in acute distress.    Appearance: He is well-developed.  HENT:     Head: Normocephalic and atraumatic.     Mouth/Throat:     Mouth: Mucous membranes are moist.  Eyes:     General: Vision grossly intact. Gaze aligned appropriately.     Extraocular Movements: Extraocular movements intact.     Conjunctiva/sclera: Conjunctivae normal.  Cardiovascular:     Rate and Rhythm: Normal rate and regular rhythm.     Pulses: Normal pulses.     Heart sounds: Normal heart sounds, S1 normal and S2 normal. No murmur heard.    No friction rub. No gallop.  Pulmonary:     Effort: Pulmonary effort is normal. No respiratory distress.     Breath sounds: Normal breath sounds.  Abdominal:     Palpations: Abdomen is soft.     Tenderness: There is  no abdominal tenderness. There is no guarding or rebound.     Hernia: No hernia is present.  Musculoskeletal:        General: No swelling.     Cervical back: Full passive range of motion without pain, normal range of motion and neck supple. No pain with movement, spinous process tenderness or muscular tenderness. Normal range of motion.     Right lower leg: No edema.     Left lower leg: No edema.  Skin:    General: Skin is warm and dry.     Capillary Refill: Capillary refill takes less than 2 seconds.     Findings: No ecchymosis, erythema, lesion or wound.  Neurological:     Mental Status: He is alert and oriented to person, place, and time.     GCS: GCS eye subscore is 4. GCS verbal subscore is 5. GCS motor  subscore is 6.     Cranial Nerves: Cranial nerves 2-12 are intact.     Sensory: Sensation is intact.     Motor: Motor function is intact. No weakness or abnormal muscle tone.     Coordination: Coordination is intact.  Psychiatric:        Mood and Affect: Mood normal.        Speech: Speech normal.        Behavior: Behavior normal.     (all labs ordered are listed, but only abnormal results are displayed) Labs Reviewed  COMPREHENSIVE METABOLIC PANEL WITH GFR - Abnormal; Notable for the following components:      Result Value   CO2 21 (*)    Glucose, Bld 140 (*)    BUN 27 (*)    Creatinine, Ser 1.44 (*)    Calcium  8.8 (*)    Albumin 3.3 (*)    GFR, Estimated 56 (*)    All other components within normal limits  BRAIN NATRIURETIC PEPTIDE - Abnormal; Notable for the following components:   B Natriuretic Peptide 805.3 (*)    All other components within normal limits  TROPONIN I (HIGH SENSITIVITY) - Abnormal; Notable for the following components:   Troponin I (High Sensitivity) 29 (*)    All other components within normal limits  TROPONIN I (HIGH SENSITIVITY) - Abnormal; Notable for the following components:   Troponin I (High Sensitivity) 26 (*)    All other components within normal limits  CBC WITH DIFFERENTIAL/PLATELET    EKG: EKG Interpretation Date/Time:  Thursday March 30 2024 23:56:11 EDT Ventricular Rate:  57 PR Interval:  160 QRS Duration:  104 QT Interval:  436 QTC Calculation: 424 R Axis:   72  Text Interpretation: Sinus bradycardia Minimal voltage criteria for LVH, may be normal variant ( Sokolow-Lyon ) ST & Marked T-wave abnormality, consider inferolateral ischemia Abnormal ECG When compared with ECG of 19-Mar-2024 18:23, No acute changes Confirmed by Haze Lonni PARAS ([id number]) on 03/31/2024 2:28:25 AM  Radiology: DG Chest 2 View Result Date: 03/31/2024 EXAM: 2 VIEW(S) XRAY OF THE CHEST 03/31/2024 02:55:35 AM COMPARISON: 03/18/2024 CLINICAL HISTORY:  shortness of breath. Shortness of breath shortness of breath. Shortness of breath FINDINGS: LUNGS AND PLEURA: No focal pulmonary opacity. No pulmonary edema. No pleural effusion. No pneumothorax. HEART AND MEDIASTINUM: Mild cardiomegaly. BONES AND SOFT TISSUES: No acute osseous abnormality. IMPRESSION: 1. No acute cardiopulmonary process. 2. Mild cardiomegaly. Electronically signed by: Pinkie Pebbles MD 03/31/2024 03:00 AM EDT RP Workstation: [id number]   CT HEAD WO CONTRAST ( ) Result Date: 03/31/2024 EXAM: CT HEAD WITHOUT CONTRAST 03/31/2024 12:11:06  AM TECHNIQUE: CT of the head was performed without the administration of intravenous contrast. Automated exposure control, iterative reconstruction, and/or weight based adjustment of the mA/kV was utilized to reduce the radiation dose to as low as reasonably achievable. COMPARISON: Head CT 06/27/2017. CLINICAL HISTORY: Headache, new onset (Age >= 51y). Pt comes into the ed stating I need to be evaluated for a stroke this RN attempted a neuro exam and sort and pt VERY uncooperative. Pt taken back to triage. FINDINGS: BRAIN AND VENTRICLES: There is mild global cortical atrophy, with moderately developed small vessel disease of the cerebral white matter, the latter showing mild progression from 2019. No cortical-based acute infarct, hemorrhage, mass effect or midline shift are seen. The basal cisterns are clear. There are age-advanced calcific plaques in the distal vertebral arteries and siphons, but no hyperdense central vessels. No hydrocephalus. No extra-axial collection. ORBITS: No acute abnormality. SINUSES: No acute abnormality. SOFT TISSUES AND SKULL: No acute soft tissue abnormality. No skull fracture. No focal skull lesions. Cutaneous calcifications are again scattered over the frontal falx. IMPRESSION: 1. No acute intracranial CT findings. 2. Mild global cortical atrophy and moderately developed small vessel disease of the cerebral white matter, with  progression in the white matter disease from 2019. Electronically signed by: Francis Quam MD 03/31/2024 12:18 AM EDT RP Workstation: [id number]     Procedures   Medications Ordered in the ED  hydrALAZINE  (APRESOLINE ) injection 10 mg (10 mg Intravenous Given 03/31/24 0431)                                    Medical Decision Making Amount and/or Complexity of Data Reviewed Labs: ordered. Decision-making details documented in ED Course. Radiology: ordered and independent interpretation performed. Decision-making details documented in ED Course. ECG/medicine tests: ordered and independent interpretation performed. Decision-making details documented in ED Course.  Risk Prescription drug management.   Differential Diagnosis considered includes, but not limited to: Hypertensive emergencies, Idiopathic intracranial hypertension, Space occupying lesions (tumors, abscesses, cysts), Acute hydrocephalus, Dural sinus thrombosis, Intracranial hemorrhage, Cerebrovascular accident or stroke, Meningitis and encephalitis, Migraine HA, Tension HA.  Presents to the Emergency Department for evaluation of headache.  When I encountered the patient, he was sleeping.  He easily aroused.  Complains of headache but mostly wants a sandwich.  Does not appear to be in any distress.  Neurologic exam nonfocal, normal.  Patient underwent CT head that did not show any acute abnormality.  Nursing notes reviewed, although patient not currently complaining of chest pain or shortness of breath, he did mention in triage.  Cardiac evaluation is at his baseline without any significant worsening.  Slightly elevated troponins, chronic and plateaued.  No ischemic changes on EKG.  Repeat blood pressure after hydralazine  much improved.  Discharge, follow-up with primary care.     Final diagnoses:  Primary hypertension  Bad headache    ED Discharge Orders     None          Maryanna Stuber, Lonni PARAS, MD 03/31/24  [phone]

## 2024-03-31 NOTE — ED Provider Notes (Signed)
 [place] EMERGENCY DEPARTMENT AT [facility] Provider Note   CSN: [id number] Arrival date & time: 03/31/24  9178     Patient presents with: Psychiatric Evaluation   [name] is a 58 y.o. male.   The history is provided by the patient, the EMS personnel and medical records. No language interpreter was used.     58 year old male with history of bipolar disorder, polysubstance use, CAD, hepatitis C, prior stroke who presenting with concerns of psychiatric illness.  Patient states he would like to be kept inpatient so that where he can receive his regular medication on time.  States that he does not always remember when to take his medication and he felt stating patient can be helpful with that.  He mention he would like to be around other people that think similar to him.  He does not endorse any SI HI or hallucination.  Patient was seen earlier this morning for other complaint including I thought I was having a stroke.  Patient also requesting for something to eat.  He denies any active pain at this time.  Prior to Admission medications   Medication Sig Start Date End Date Taking? Authorizing Provider  albuterol  (VENTOLIN  HFA) 108 (90 Base) MCG/ACT inhaler Inhale 2 puffs into the lungs every 4 (four) hours as needed for wheezing. 04/24/23   [provider]  amLODipine  (NORVASC ) 10 MG tablet Take 1 tablet (10 mg total) by mouth daily. 03/21/24   Juvenal Harlene PENNER, DO  aspirin  81 MG EC tablet Chew 81 mg by mouth daily.    [provider]  blood glucose meter kit and supplies Dispense based on patient and insurance preference. Use up to four times daily as directed. (FOR ICD-10 E10.9, E11.9). 07/22/23   Fairy Frames, MD  empagliflozin  (JARDIANCE ) 10 MG TABS tablet Take 1 tablet (10 mg total) by mouth daily. Patient not taking: Reported on 03/18/2024 07/23/23   Fairy Frames, MD  finasteride  (PROSCAR ) 5 MG tablet Take 5 mg by mouth daily. Patient not taking:  Reported on 03/18/2024 01/25/21   [provider]  furosemide  (LASIX ) 40 MG tablet Take 1 tablet (40 mg total) by mouth daily. 03/20/24   Vann, Jessica U, DO  losartan (COZAAR) 50 MG tablet Take 1 tablet (50 mg total) by mouth daily. 03/20/24   Vann, Jessica U, DO  rosuvastatin  (CRESTOR ) 40 MG tablet Take 1 tablet (40 mg total) by mouth daily. Patient not taking: Reported on 03/18/2024 07/23/23   Fairy Frames, MD  spironolactone  (ALDACTONE ) 25 MG tablet Take 1 tablet (25 mg total) by mouth daily. 03/20/24   Vann, Jessica U, DO  tamsulosin  (FLOMAX ) 0.4 MG CAPS capsule Take 1 capsule (0.4 mg total) by mouth daily. 03/20/24   Vann, Jessica U, DO    Allergies: Ace inhibitors and Penicillins    Review of Systems  All other systems reviewed and are negative.   Updated Vital Signs BP (!) 181/117 (BP Location: Right Arm)   Pulse (!) 54   Temp 98.6 F (37 C)   Resp 20   SpO2 99%   Physical Exam Constitutional:      General: He is not in acute distress.    Appearance: He is well-developed.  HENT:     Head: Atraumatic.  Eyes:     Conjunctiva/sclera: Conjunctivae normal.  Cardiovascular:     Rate and Rhythm: Normal rate and regular rhythm.     Pulses: Normal pulses.     Heart sounds: Normal heart  sounds.  Pulmonary:     Effort: Pulmonary effort is normal.     Breath sounds: Normal breath sounds.  Abdominal:     Palpations: Abdomen is soft.  Musculoskeletal:     Cervical back: Normal range of motion and neck supple.  Skin:    Findings: No rash.  Neurological:     Mental Status: He is alert.  Psychiatric:        Mood and Affect: Mood normal.        Speech: Speech normal.        Behavior: Behavior is cooperative.        Thought Content: Thought content is not paranoid. Thought content does not include homicidal or suicidal ideation.     (all labs ordered are listed, but only abnormal results are displayed) Labs Reviewed - No data to display  EKG: None  Radiology: DG  Chest 2 View Result Date: 03/31/2024 EXAM: 2 VIEW(S) XRAY OF THE CHEST 03/31/2024 02:55:35 AM COMPARISON: 03/18/2024 CLINICAL HISTORY: shortness of breath. Shortness of breath shortness of breath. Shortness of breath FINDINGS: LUNGS AND PLEURA: No focal pulmonary opacity. No pulmonary edema. No pleural effusion. No pneumothorax. HEART AND MEDIASTINUM: Mild cardiomegaly. BONES AND SOFT TISSUES: No acute osseous abnormality. IMPRESSION: 1. No acute cardiopulmonary process. 2. Mild cardiomegaly. Electronically signed by: Pinkie Pebbles MD 03/31/2024 03:00 AM EDT RP Workstation: [id number]   CT HEAD WO CONTRAST ( ) Result Date: 03/31/2024 EXAM: CT HEAD WITHOUT CONTRAST 03/31/2024 12:11:06 AM TECHNIQUE: CT of the head was performed without the administration of intravenous contrast. Automated exposure control, iterative reconstruction, and/or weight based adjustment of the mA/kV was utilized to reduce the radiation dose to as low as reasonably achievable. COMPARISON: Head CT 06/27/2017. CLINICAL HISTORY: Headache, new onset (Age >= 51y). Pt comes into the ed stating I need to be evaluated for a stroke this RN attempted a neuro exam and sort and pt VERY uncooperative. Pt taken back to triage. FINDINGS: BRAIN AND VENTRICLES: There is mild global cortical atrophy, with moderately developed small vessel disease of the cerebral white matter, the latter showing mild progression from 2019. No cortical-based acute infarct, hemorrhage, mass effect or midline shift are seen. The basal cisterns are clear. There are age-advanced calcific plaques in the distal vertebral arteries and siphons, but no hyperdense central vessels. No hydrocephalus. No extra-axial collection. ORBITS: No acute abnormality. SINUSES: No acute abnormality. SOFT TISSUES AND SKULL: No acute soft tissue abnormality. No skull fracture. No focal skull lesions. Cutaneous calcifications are again scattered over the frontal falx. IMPRESSION: 1. No acute  intracranial CT findings. 2. Mild global cortical atrophy and moderately developed small vessel disease of the cerebral white matter, with progression in the white matter disease from 2019. Electronically signed by: Francis Quam MD 03/31/2024 12:18 AM EDT RP Workstation: [id number]     Procedures   Medications Ordered in the ED - No data to display                                  Medical Decision Making  BP (!) 181/117 (BP Location: Right Arm)   Pulse (!) 54   Temp 98.6 F (37 C)   Resp 20   SpO2 99%   74:66 PM 58 year old male with history of bipolar disorder, polysubstance use, CAD, hepatitis C, prior stroke who presenting with concerns of psychiatric illness.  Patient states he would like to be kept inpatient so that where he  can receive his regular medication on time.  States that he does not always remember when to take his medication and he felt stating patient can be helpful with that.  He mention he would like to be around other people that think similar to him.  He does not endorse any SI HI or hallucination.  Patient was seen earlier this morning for other complaint including I thought I was having a stroke.  Patient also requesting for something to eat.  He denies any active pain at this time.  On exam patient is resting comfortably appears to be in no acute discomfort.  Heart with normal rate and rhythm, lungs are clear abdomen soft nontender.  He does not appear to be responding to either internal or external stimuli.  EMR review patient was seen in the ER earlier this morning for multiple complaint.  At which point, labs, head CT scan, chest x-ray was obtained without any acute finding.  Patient was subsequently discharged.  Currently his primary request is to be kept inpatient so that way he can have someone else to administer his daily medication on time.  I do not think patient is having an acute psychotic event or any acute emergent medical condition.  I do not think  repeating labs is indicated.  I will request TTS to evaluate patient.  Food provided per patient request.  TTS and psychiatry has seen and evaluated patient.  Felt patient will benefit from outpatient resources for shelter along with safety return precaution.  Recommend patient to abstain from cocaine use and to consider substance abuse treatment if and when available.  Patient did not meet inpatient criteria for psychiatric management.       Final diagnoses:  Malingering  Homelessness    ED Discharge Orders     None          Nivia Colon, PA-C 03/31/24 1510    Mannie Pac T, DO 04/07/24 2226

## 2024-03-31 NOTE — Consult Note (Addendum)
 [facility] Psychiatric Consult Initial  Patient Name: .[name]  MRN: [id number]  DOB: Nov 11, 1965  Consult Order details:  Orders (From admission, onward)     Start     Ordered   03/31/24 1302  CONSULT TO CALL ACT TEAM       Ordering Provider: Nivia Colon, PA-C  Provider:  (Not yet assigned)  Question:  Reason for Consult?  Answer:  outpatient support and guidance for his psychiatric illness   03/31/24 1302             Mode of Visit: In person    Psychiatry Consult Evaluation  Service Date: March 31, 2024 LOS:  LOS: 0 days  Chief Complaint: Psychiatric evaluation   Primary Psychiatric Diagnoses  Malingering 2.   Homelessness 3.   Cocaine abuse (HCC) Assessment   [name] is a 58 y.o. AA male with a past psychiatric history of polysubstance abuse, substance-induced mood disorder/psychotic disorder, bipolar disorder, malingering, depression, and anxiety, with pertinent medical comorbidities/history that include hypertension, CAD, CVA, GERD, hepatitis C, hypercholesteremia, CHF, and CKD stage III, who notably initially checked in earlier today for the chief complaint of headache and shortness of breath, who upon EDP evaluation and care, was subsequently discharged, but then checked back in only a short time later, with the new chief complaint of need to be admitted to behavioral health services, who upon subsequent EDP evaluation, consulted psychiatry for specialty evaluation and recommendations.  Patient currently is voluntary at this time, as well as medically clear, per EDP team.  Upon evaluation, patient presents with symptomology that is most consistent with malingering, in the context of new onset homelessness, and cocaine abuse. Evidence of this is appreciable from evaluation conducted, where it is revealed through evaluation of the patient, that he desires inpatient mental health hospitalization solely for temporary shelter, in the context of again new onset  homelessness (I.e., kicked out of mother's residence), in the further context of recently abusing cocaine while at his mother's House.  Given evaluation investigation conducted, there is no evidence that the patient is an imminent risk to himself or others, decompensated in his mental health in such a manner that would warrant the recommendation for inpatient mental health hospitalization, or with desire to address his endorsed cocaine abuse in any serious manner, thus the recommendation at this time is for psychiatric clearance, as well as additional recommendations listed below.  Patient's prognosis is guarded, given desire to inappropriately utilize emergency services, in the context of new onset homelessness, and endorsed desire to not address his substance abuse in any serious manner, or consider the recommended lesser level of care measures that are available in the community that are available to him.    Patient during examination refused all outpatient resources, but nonetheless, patient was instructed that outpatient resources and shelter resources would be given to him upon discharge, for if/when he becomes amenable to possibly utilizing them.  Spoke with Dr. Merilee who is in agreement with recommendation for psychiatric clearance, as well as additional recommendations listed below.  Diagnoses:  Active Hospital problems: Principal Problem:   Malingering Active Problems:   Cocaine abuse [facility])   Homelessness    Plan   # Malingering # Homelessness  ## Psychiatric Recommendations:   -Recommend shelter resources to be given upon discharge -Recommend patient consider utilization of outpatient mental health resources given upon discharge -Recommend safety return precautions -Recommend patient consider abstinence from endorsed cocaine use -Recommend patient consider substance abuse treatment, if/when  amenable  ## Medical Decision Making Capacity: Not specifically addressed in this  encounter  ## Further Work-up: None at this time  ## Disposition:-- There are no psychiatric contraindications to discharge at this time  ## Behavioral / Environmental: -Routine agitation/safety precautions until discharge; safety return precautions upon discharge    ## Safety and Observation Level:  - Based on my clinical evaluation, I estimate the patient to be at low risk of self harm in the current setting and upon recommendation for discharge. - At this time, we recommend  routine. This decision is based on my review of the chart including patient's history and current presentation, interview of the patient, mental status examination, and consideration of suicide risk including evaluating suicidal ideation, plan, intent, suicidal or self-harm behaviors, risk factors, and protective factors. This judgment is based on our ability to directly address suicide risk, implement suicide prevention strategies, and develop a safety plan while the patient is in the clinical setting. Please contact our team if there is a concern that risk level has changed.  CSSR Risk Category:C-SSRS RISK CATEGORY: No Risk  Suicide Risk Assessment: Patient has following modifiable risk factors for suicide: medication noncompliance and triggering events, which we are addressing by recommendations. Patient has following non-modifiable or demographic risk factors for suicide: male gender and psychiatric hospitalization Patient has the following protective factors against suicide: Access to outpatient mental health care, Frustration tolerance, no history of suicide attempts, and no history of NSSIB  Thank you for this consult request. Recommendations have been communicated to the primary team.  We will sign off at this time.   [name] JINNY Gravely, NP       History of Present Illness   [name] is a 58 y.o. AA male with a past psychiatric history of polysubstance abuse, substance-induced mood disorder/psychotic  disorder, bipolar disorder, malingering, depression, and anxiety, with pertinent medical comorbidities/history that include hypertension, CAD, CVA, GERD, hepatitis C, hypercholesteremia, CHF, and CKD stage III, who notably initially checked in earlier today for the chief complaint of headache and shortness of breath, who upon EDP evaluation and care, was subsequently discharged, but then checked back in only a short time later, with the new chief complaint of need to be admitted to behavioral health services, who upon subsequent EDP evaluation, consulted psychiatry for specialty evaluation and recommendations.  Patient currently is voluntary at this time, as well as medically clear, per EDP team.  Patient seen today at the [facility] emergency department for face-to-face psychiatric evaluation.  Upon evaluation, patient is appreciably lying in bed attempting to sleep, when upon approach, immediately is observably irritable with this provider in his interpersonal style (appearing to be upset he has been disturbed), but upon introduction and prompting to participate with this provider in examination, given consultation requested by him to speak to psychiatry, for the endorsed chief complaint of desire for inpatient mental health hospitalization, he observably, in a reluctant manor, irritable adjusts himself while still lying down trying to rest, in a manor suggesting he is amenable to speaking, but only while resting.   Prompted to discuss why the patient came in, patient endorses vaguely that he came in because, I need to be admitted so I can be given my medications.  Prompted to expand on this, patient abruptly and curtly endorses that he was just recently abruptly kicked out of his mother's residence that he has been living at for, supposedly smoking crack in the House, when I was not, I always smoke  crack down the street away from the House, and because of this, he is now homeless and in need of  someplace to stay so I can be given my medications, that is a place he states that has, people that think like me.  Prompted to expand on patient's endorsement of needing to stay someplace to be given medications that has, people that think like me, patient irritably expands on this, but in an indirect manor by stating, and giving insight angrily that, look, I need a spot that I can stay for like 30 days, so I can receive my medications, and I can have someplace until my check comes in. Directed to expand on this, patient irritably endorses he lives on Social Security disability, and that he needs some place to stay for at least till the end of the month, so that he will he states have the financial means to pay for shelter housing he has he states recently applied for in the area, but does not notably expand on medications.  Redirected to expand out on medications, given multiple reports to numerous healthcare providers and staff that he is desiring inpatient to stay on his medications, he irritably and vaguely endorses initially, it's whatever is in the chart, I take that, but when then confronted on this provider's chart review, of which reveals the patient has not been on any psychiatric medications in years, patient then endorses that this is true, he has not been on any psychiatric medications, but that actually, I've been telling ya'll I need to get back on my medications, ya'll don't listen.   Redirected to expand on the patient's previous endorsements of cocaine abuse, patient irritability largely declines to discuss his cocaine use, states that it is a nonissue, and that he has been, smoking crack for 30 years, ain't nothing going to change about that, and when prompted to discuss substance use in general, outside of endorsing he does not smoke cannabis anymore, or utilize EtOH, he is reluctant to provide any more substantive historical information, but does endorse that he smokes  tobacco daily.   Redirected to discuss formal questioning, patient denies any meaningful instability of his mental health, outside of  giving expressions of anger and frustration out of being abruptly kicked out of his mother's residence, and having an endorsed, nowhere to go.  Patient endorses poor sleep and eating due to abrupt onset homelessness and irritably asked this provider why he has not been given food or any. Patient endorses, like previously endorsed to EDP staff, as well as nursing, that he remains without suicidal or homicidal ideations, denies history of self injury, and denies any suicide attempts.  Patient endorses irritably that his mood is, annoyed about being asked so many questions, versus being, just admitted, and presents with variable eye contact, a congruent affect, and congruent irritable interpersonal style.  Patient endorses that he does not have outpatient services, states that he has not used outpatient services before, and that currently, outpatient is not an option, I got no place to go man, I already told ya'll this, man.   Discussed with patient at this time that given investigation conducted, the recommendation would be for the patient to consider close follow-up with lesser level of care measures that are available in the community through resources that can be extended by the EDP team and behavioral health team, given that the patient from this provider's examination, has made it abundantly clear that his desire is for solely for temporary shelter,  in the context of abrupt onset of homelessness, to which patient endorsed angrily that he did not agree.  Review of Systems  Constitutional:  Positive for malaise/fatigue.  Psychiatric/Behavioral:  Negative for depression, hallucinations, substance abuse and suicidal ideas. The patient is not nervous/anxious and does not have insomnia.   All other systems reviewed and are negative.    Psychiatric and Social  History  Psychiatric History:  Information collected from chart review/patient  Prev Dx/Sx: See above Current Psych Provider: None Home Meds (current): None Previous Med Trials: Per chart review, BuSpar Therapy: None  Prior Psych Hospitalization: Multiple many years ago, most recent behavioral health 2012 Prior Self Harm: Denies Prior Violence: Denies  Family Psych History: Denies Family Hx suicide: Denies  Social History:  Developmental Hx: None reported Educational Hx: None reported Occupational Hx: SSDI  Legal Hx: None reported Living Situation: Abruptly homeless, kicked out of mother's residence Spiritual Hx: None reported Access to weapons/lethal means: Denies  Substance History Alcohol: Denies  Tobacco: Yes Illicit drugs: Yes, cocaine, hx thc Prescription drug abuse: Denies Rehab hx: Denies  Exam Findings  Physical Exam: As below Vital Signs:  Temp:  [97.5 F (36.4 C)-98.6 F (37 C)] 98.6 F (37 C) (10/17 1212) Pulse Rate:  [54-126] 54 (10/17 1212) Resp:  [16-22] 20 (10/17 1212) BP: (158-197)/(89-135) 181/117 (10/17 1212) SpO2:  [90 %-100 %] 99 % (10/17 1212) Blood pressure (!) 181/117, pulse (!) 54, temperature 98.6 F (37 C), resp. rate 20, SpO2 99%. There is no height or weight on file to calculate BMI.  Physical Exam Vitals and nursing note reviewed.  Constitutional:      General: He is not in acute distress.    Appearance: Normal appearance. He is normal weight. He is not ill-appearing, toxic-appearing or diaphoretic.     Comments: Irritable interpersonal style to eventually agitated and intently related interpersonal style   Pulmonary:     Effort: Pulmonary effort is normal.  Neurological:     Mental Status: He is alert and oriented to person, place, and time.     Motor: No weakness, tremor or seizure activity.  Psychiatric:        Attention and Perception: Perception normal. He is inattentive (Mildly inattentive (intentional)). He does not  perceive auditory or visual hallucinations.        Speech: Speech is delayed.        Behavior: Behavior is agitated and aggressive.        Thought Content: Thought content normal. Thought content is not paranoid or delusional. Thought content does not include homicidal or suicidal ideation.        Cognition and Memory: Cognition and memory normal.        Judgment: Judgment is inappropriate.     Comments: Affect: constricted to eventually angry Mood: Irritable to eventually angry and agitated Speech: Abrupt and curt to eventually briefly rapid and aggressive towards this provider Behavior: Selectively cooperative to eventually posturing and agitated       Mental Status Exam: General Appearance: Normal bulk and tone African-American male who looks older than stated age, and appropriate casual street clothing, with fair grooming and hygiene, with irritable interpersonal style initially, but upon disengagement, eventually agitated and intently related in his interpersonal style  Orientation:  Full (Time, Place, and Person)  Memory:  Grossly intact  Concentration: Variable, frequently inattentive intentionally  Recall:  Grossly intact  Attention  Variable, frequently inattentive intentionally  Eye Contact: Variable  Speech:  Largely abrupt and curtto eventually  briefly rapid and aggressive towards this provider  Language:  Fair  Volume: Normal to eventually angry and briefly loud  Mood: Irritable to eventually angry  Affect:  Congruent annoyed  Thought Process:  Coherent and Goal Directed; superficial, intentionally short, to eventually circumstantial briefly  Thought Content:  Logical  Suicidal Thoughts:  No  Homicidal Thoughts:  No  Judgement:  Other:  Intact, but inappropriate  Insight:  Lacking  Psychomotor Activity:  Normal  Akathisia:  No  Fund of Knowledge:  Grossly intact      Assets:  Communication Skills Desire for Improvement Financial Resources/Insurance Leisure  Time Physical Health Resilience Talents/Skills Vocational/Educational  Cognition:  WNL  ADL's:  Intact  AIMS (if indicated):   0     Other History   These have been pulled in through the EMR, reviewed, and updated if appropriate.  Family History:  The patient's family history includes Arthritis in his maternal uncle; Asthma in his maternal aunt; Colon polyps in his mother; Emphysema in his maternal aunt; Hearing loss in his maternal grandmother; Heart disease in his maternal grandmother; Skin cancer in his maternal grandmother; Stroke in his father.  Medical History: Past Medical History:  Diagnosis Date   Acute on chronic combined systolic and diastolic CHF (congestive heart failure) (HCC) 07/11/2023   AKI (acute kidney injury) 07/10/2023   Allergic rhinitis    Anxiety    Asthma    Bilateral lower extremity edema 07/10/2023   Bipolar disorder (HCC)    Bradycardia    CAD (coronary artery disease)    Chest pain 03/18/2024   CKD (chronic kidney disease) stage 3, GFR 30-59 ml/min (HCC) 01/28/2024   Cocaine abuse (HCC) 05/28/2023   Cocaine use 01/28/2024   Continuous dependence on cigarette smoking 03/19/2021   CVA (cerebral vascular accident) (HCC)    Dyspnea on exertion 03/19/2021   Elevated LFTs 07/10/2023   Elevated troponin 01/28/2024   Essential hypertension 03/11/2021   Flash pulmonary edema (HCC) 01/28/2024   GERD (gastroesophageal reflux disease)    Headache    Hepatitis C    History of CAD (coronary artery disease) 01/28/2024   History of cocaine use 01/28/2024   History of CVA (cerebrovascular accident) 03/11/2021            History of heart artery stent    History of myocardial infarction    History of stroke    Hypercholesterolemia    Hypertension    Marijuana use 01/28/2024   MI (myocardial infarction) (HCC)    Non-insulin  dependent type 2 diabetes mellitus (HCC) 07/10/2023   NSVT (nonsustained ventricular tachycardia) (HCC) 07/19/2023   Protein-calorie  malnutrition, severe 07/12/2023   Stroke (HCC)    Swelling of both lower extremities 05/26/2023    Surgical History: Past Surgical History:  Procedure Laterality Date   CORONARY STENT PLACEMENT     x2 stents      Medications:  No current facility-administered medications for this encounter.  Current Outpatient Medications:    albuterol  (VENTOLIN  HFA) 108 (90 Base) MCG/ACT inhaler, Inhale 2 puffs into the lungs every 4 (four) hours as needed for wheezing., Disp: , Rfl:    amLODipine  (NORVASC ) 10 MG tablet, Take 1 tablet (10 mg total) by mouth daily., Disp: 30 tablet, Rfl: 0   aspirin  81 MG EC tablet, Chew 81 mg by mouth daily., Disp: , Rfl:    blood glucose meter kit and supplies, Dispense based on patient and insurance preference. Use up to four times daily as directed. (FOR ICD-10  E10.9, E11.9)., Disp: 1 each, Rfl: 0   empagliflozin  (JARDIANCE ) 10 MG TABS tablet, Take 1 tablet (10 mg total) by mouth daily. (Patient not taking: Reported on 03/18/2024), Disp: 30 tablet, Rfl: 1   finasteride  (PROSCAR ) 5 MG tablet, Take 5 mg by mouth daily. (Patient not taking: Reported on 03/18/2024), Disp: , Rfl:    furosemide  (LASIX ) 40 MG tablet, Take 1 tablet (40 mg total) by mouth daily., Disp: , Rfl:    losartan (COZAAR) 50 MG tablet, Take 1 tablet (50 mg total) by mouth daily., Disp: 30 tablet, Rfl: 0   rosuvastatin  (CRESTOR ) 40 MG tablet, Take 1 tablet (40 mg total) by mouth daily. (Patient not taking: Reported on 03/18/2024), Disp: 30 tablet, Rfl: 1   spironolactone  (ALDACTONE ) 25 MG tablet, Take 1 tablet (25 mg total) by mouth daily., Disp: 15 tablet, Rfl: 1   tamsulosin  (FLOMAX ) 0.4 MG CAPS capsule, Take 1 capsule (0.4 mg total) by mouth daily., Disp: 30 capsule, Rfl: 3  Allergies: Allergies  Allergen Reactions   Ace Inhibitors Swelling   Penicillins Swelling    Throat and eye swelling Has patient had a PCN reaction causing immediate rash, facial/tongue/throat swelling, SOB or lightheadedness  with hypotension: Yes Has patient had a PCN reaction causing severe rash involving mucus membranes or skin necrosis: No Has patient had a PCN reaction that required hospitalization: No Has patient had a PCN reaction occurring within the last 10 years: No If all of the above answers are NO, then may proceed with Cephalosporin use.    [name] JINNY Gravely, NP

## 2024-04-05 ENCOUNTER — Emergency Department (HOSPITAL_COMMUNITY)
Admission: EM | Admit: 2024-04-05 | Discharge: 2024-04-06 | Disposition: A | Discharge status: Home / Self Care | Attending: Emergency Medicine | Admitting: Emergency Medicine

## 2024-04-05 ENCOUNTER — Other Ambulatory Visit: Payer: Self-pay

## 2024-04-05 ENCOUNTER — Encounter (HOSPITAL_COMMUNITY): Payer: Self-pay

## 2024-04-05 DIAGNOSIS — R519 Headache, unspecified: Secondary | ICD-10-CM | POA: Diagnosis not present

## 2024-04-05 DIAGNOSIS — Z79899 Other long term (current) drug therapy: Secondary | ICD-10-CM | POA: Insufficient documentation

## 2024-04-05 DIAGNOSIS — Z5900 Homelessness unspecified: Secondary | ICD-10-CM | POA: Diagnosis not present

## 2024-04-05 DIAGNOSIS — Z8673 Personal history of transient ischemic attack (TIA), and cerebral infarction without residual deficits: Secondary | ICD-10-CM | POA: Diagnosis not present

## 2024-04-05 DIAGNOSIS — N189 Chronic kidney disease, unspecified: Secondary | ICD-10-CM | POA: Insufficient documentation

## 2024-04-05 DIAGNOSIS — Z7982 Long term (current) use of aspirin: Secondary | ICD-10-CM | POA: Insufficient documentation

## 2024-04-05 DIAGNOSIS — I129 Hypertensive chronic kidney disease with stage 1 through stage 4 chronic kidney disease, or unspecified chronic kidney disease: Secondary | ICD-10-CM | POA: Insufficient documentation

## 2024-04-05 DIAGNOSIS — I10 Essential (primary) hypertension: Secondary | ICD-10-CM | POA: Diagnosis not present

## 2024-04-05 MED ORDER — IBUPROFEN 400 MG PO TABS
400.0000 mg | ORAL_TABLET | Freq: Once | ORAL | Status: AC | PRN
  Administered 2024-04-05: 400 mg via ORAL
  Filled 2024-04-05: qty 1

## 2024-04-05 MED ORDER — ONDANSETRON 4 MG PO TBDP
4.0000 mg | ORAL_TABLET | Freq: Once | ORAL | Status: AC
  Administered 2024-04-05: 4 mg via ORAL
  Filled 2024-04-05: qty 1

## 2024-04-05 NOTE — ED Triage Notes (Signed)
 Pt POV by self d/t Headache and HTN.   He states his head  has been pounding for 3 hours 10/10.  BP was High - does not know value.

## 2024-04-05 NOTE — ED Notes (Signed)
 Pt refused EKG. Triage Rn aware.

## 2024-04-06 DIAGNOSIS — R519 Headache, unspecified: Secondary | ICD-10-CM | POA: Diagnosis not present

## 2024-04-06 MED ORDER — AMLODIPINE BESYLATE 5 MG PO TABS
10.0000 mg | ORAL_TABLET | ORAL | Status: AC
  Administered 2024-04-06: 10 mg via ORAL
  Filled 2024-04-06: qty 2

## 2024-04-06 MED ORDER — LOSARTAN POTASSIUM 50 MG PO TABS
50.0000 mg | ORAL_TABLET | ORAL | Status: AC
  Administered 2024-04-06: 50 mg via ORAL
  Filled 2024-04-06: qty 1

## 2024-04-06 NOTE — ED Provider Notes (Signed)
 [place] EMERGENCY DEPARTMENT AT [facility] Provider Note   CSN: [id number] Arrival date & time: 04/05/24  2304     Patient presents with: Headache   [name] is a 58 y.o. male with history of MI, marijuana use, cocaine use, chest pain, history of stroke, hypertension, homelessness, CKD, dyspnea on exertion.  Presents to ED complaining of headache, high blood pressure.  States that he has noticed a headache for the last 3 hours prompting him to come to the ED.  Reports he has not taken his blood pressure medication today because he is currently being evicted from his house.  He states he does have blood pressure medication in his possession.  He reports that he is stressed due to this.  He reports that he feels that his blood pressure is elevated due to him being stressed.  He reports he has a headache but states headache is 5 out of 10.  He denies any chest pain, shortness of breath, abdominal pain, blurred vision, nausea or vomiting.  Denies fevers.  Patient not willing to give blood, reports that he has already been poked this week.  Patient also unwilling to undergo CT scan as he reports this is already been done this week.  He does not wish to receive any IV medication for high blood pressure.  He reports that his blood pressure is coming down.  He states that the only reason he came today was to have my blood pressure checked.   Headache      Prior to Admission medications   Medication Sig Start Date End Date Taking? Authorizing Provider  albuterol  (VENTOLIN  HFA) 108 (90 Base) MCG/ACT inhaler Inhale 2 puffs into the lungs every 4 (four) hours as needed for wheezing. 04/24/23   [provider]  amLODipine  (NORVASC ) 10 MG tablet Take 1 tablet (10 mg total) by mouth daily. 03/21/24   Juvenal Harlene PENNER, DO  aspirin  81 MG EC tablet Chew 81 mg by mouth daily.    [provider]  blood glucose meter kit and supplies Dispense based on patient and  insurance preference. Use up to four times daily as directed. (FOR ICD-10 E10.9, E11.9). 07/22/23   Fairy Frames, MD  empagliflozin  (JARDIANCE ) 10 MG TABS tablet Take 1 tablet (10 mg total) by mouth daily. Patient not taking: Reported on 03/18/2024 07/23/23   Fairy Frames, MD  finasteride  (PROSCAR ) 5 MG tablet Take 5 mg by mouth daily. Patient not taking: Reported on 03/18/2024 01/25/21   [provider]  furosemide  (LASIX ) 40 MG tablet Take 1 tablet (40 mg total) by mouth daily. 03/20/24   Vann, Jessica U, DO  losartan (COZAAR) 50 MG tablet Take 1 tablet (50 mg total) by mouth daily. 03/20/24   Vann, Jessica U, DO  rosuvastatin  (CRESTOR ) 40 MG tablet Take 1 tablet (40 mg total) by mouth daily. Patient not taking: Reported on 03/18/2024 07/23/23   Fairy Frames, MD  spironolactone  (ALDACTONE ) 25 MG tablet Take 1 tablet (25 mg total) by mouth daily. 03/20/24   Vann, Jessica U, DO  tamsulosin  (FLOMAX ) 0.4 MG CAPS capsule Take 1 capsule (0.4 mg total) by mouth daily. 03/20/24   Vann, Jessica U, DO    Allergies: Ace inhibitors and Penicillins    Review of Systems  Eyes:  Negative for visual disturbance.  Respiratory:  Negative for shortness of breath.   Cardiovascular:  Negative for chest pain.  Neurological:  Positive for headaches.  All other systems reviewed and are negative.  Updated Vital Signs BP (!) 164/103   Pulse 73   Temp 97.8 F (36.6 C) (Oral)   Resp 17   Ht 6' 2 (1.88 m)   Wt 65.8 kg   SpO2 100%   BMI 18.62 kg/m   Physical Exam Vitals and nursing note reviewed.  Constitutional:      General: He is not in acute distress.    Appearance: He is well-developed.  HENT:     Head: Normocephalic and atraumatic.  Eyes:     Conjunctiva/sclera: Conjunctivae normal.  Neck:     Comments: No meningismus Cardiovascular:     Rate and Rhythm: Normal rate and regular rhythm.     Heart sounds: No murmur heard. Pulmonary:     Effort: Pulmonary effort is normal. No  respiratory distress.     Breath sounds: Normal breath sounds.  Abdominal:     Palpations: Abdomen is soft.     Tenderness: There is no abdominal tenderness.  Musculoskeletal:        General: No swelling.     Cervical back: Neck supple. No tenderness.  Skin:    General: Skin is warm and dry.     Capillary Refill: Capillary refill takes less than 2 seconds.  Neurological:     General: No focal deficit present.     Mental Status: He is alert.     GCS: GCS eye subscore is 4. GCS verbal subscore is 5. GCS motor subscore is 6.     Cranial Nerves: Cranial nerves 2-12 are intact. No cranial nerve deficit.     Sensory: Sensation is intact. No sensory deficit.     Motor: Motor function is intact. No weakness.     Coordination: Coordination is intact. Heel to [facility] Test normal.     Comments: CN III through XII intact.  Intact finger-to-nose, heel-to-shin.  No pronator drift.  No slurred speech.  No facial droop.  Equal strength throughout.  Equal station throughout.  PERRL.  Tracks cross midline.  Alert and oriented x 4.  Psychiatric:        Mood and Affect: Mood normal.     (all labs ordered are listed, but only abnormal results are displayed) Labs Reviewed - No data to display  EKG: None  Radiology: No results found.  Procedures   Medications Ordered in the ED  ibuprofen (ADVIL) tablet 400 mg (400 mg Oral Given 04/05/24 2337)  ondansetron  (ZOFRAN -ODT) disintegrating tablet 4 mg (4 mg Oral Given 04/05/24 2337)  amLODipine  (NORVASC ) tablet 10 mg (10 mg Oral Given 04/06/24 0253)  losartan (COZAAR) tablet 50 mg (50 mg Oral Given 04/06/24 0253)      Medical Decision Making Risk Prescription drug management.   This is a 58 year old male presenting to the ED due to concern of high blood pressure and headache.  On exam, BP currently 190/121.  Afebrile and nontachycardic.  Lung sounds clear bilaterally, no hypoxia.  Abdomen soft and compressible.  Neurological examination at  baseline.  Patient was offered CT scan of head, laboratory evaluation.  He defers on this.  He states that he has already been poked this week and he does not wish to receive or give more blood.  He was offered IV hydralazine  for blood pressure control but defers stating he does not wish to be poked and he has a fear of needles.  He reports he has blood pressure medication in his possession but has not taken it today.  He was offered CT scan of head but  he defers stating that he does not wish to undergo this examination as he is already done this this week.  When asked what the patient expectations of this evening, he states that he just wishes to receive blood pressure medication so his blood pressure will reduce.  Patient was given home blood pressure medication losartan, amlodipine .  Patient was observed and blood pressure reduced to 164/103.  He states that his headache has reduced at this time.  He denies any other symptoms or concerns.  He was discharged home at this time in stable condition.  He reports he has blood pressure medication in his possession.  He was advised to follow-up with PCP.  Advised to return to ED with new symptoms and he voiced understanding.  Stable to discharge.    Final diagnoses:  Hypertension, unspecified type  Bad headache    ED Discharge Orders     None          Ruthell Lonni JULIANNA DEVONNA 04/06/24 0525    Midge Golas, MD 04/06/24 [phone]

## 2024-04-06 NOTE — Discharge Instructions (Addendum)
 It was a pleasure taking part in your care.  As discussed, we gave you blood pressure medication and your blood pressure reduced.  Please continue taking your blood pressure medication as prescribed.  If you develop chest pain, shortness of breath, blurred vision or eye headache in the setting of hypertension please return to the ED for further care.  You were offered CT scan of head as well as blood work [employer] but you deferred.  If you change your mind regarding this was return to the ED.

## 2024-04-12 ENCOUNTER — Encounter: Payer: Self-pay | Admitting: Physician Assistant

## 2024-04-12 ENCOUNTER — Other Ambulatory Visit: Payer: Self-pay | Admitting: Physician Assistant

## 2024-04-12 ENCOUNTER — Other Ambulatory Visit: Payer: Self-pay | Admitting: Critical Care Medicine

## 2024-04-12 ENCOUNTER — Encounter: Payer: Self-pay | Admitting: *Deleted

## 2024-04-12 ENCOUNTER — Other Ambulatory Visit (HOSPITAL_COMMUNITY): Payer: Self-pay

## 2024-04-12 MED ORDER — SPIRONOLACTONE 25 MG PO TABS
25.0000 mg | ORAL_TABLET | Freq: Every day | ORAL | 1 refills | Status: DC
  Filled 2024-04-12: qty 30, 30d supply, fill #0

## 2024-04-12 MED ORDER — LOSARTAN POTASSIUM 100 MG PO TABS
100.0000 mg | ORAL_TABLET | Freq: Every day | ORAL | 6 refills | Status: DC
  Filled 2024-04-12: qty 30, 30d supply, fill #0

## 2024-04-12 MED ORDER — ALBUTEROL SULFATE HFA 108 (90 BASE) MCG/ACT IN AERS
2.0000 | INHALATION_SPRAY | RESPIRATORY_TRACT | 0 refills | Status: DC | PRN
  Filled 2024-04-12: qty 6.7, 25d supply, fill #0

## 2024-04-12 MED ORDER — FUROSEMIDE 40 MG PO TABS
40.0000 mg | ORAL_TABLET | Freq: Every day | ORAL | 3 refills | Status: DC
  Filled 2024-04-12: qty 30, 30d supply, fill #0

## 2024-04-12 NOTE — Progress Notes (Signed)
 See note

## 2024-04-12 NOTE — Progress Notes (Signed)
 He got evicted and lived w/ mother for a month, has been here about a week.   Hx crack cocaine use, last time 1 mo and 4 days ago.  Lasix  40 mg bid Losartan 50 mg every day Hydralazine  50 mg bid Coreg  6.25 mg bid Spironolactone  25 mg every day Albuterol  MDI Flomax  0.4 mg every day Amlodipine  10 mg every day Entresto 49/51 mg bid  What he is supposed to be on per D/C summary 10/06 is:  Losartan 50 mg once daily (new) Amlodipine  10 mg once daily Spironolactone  25 mg once daily Furosemide  40 mg daily Aspirin  81 mg daily Jardiance  10 mg daily Atorvastatin 40 mg daily  Many bottles are more full than they should be, he admits that he has not been taking them as prescribed, although he is clear that he has taken everything today.   We will fill a med box with the meds he has that are on the D/C list.   His BP was elevated today so the losartan will be increased to 100 mg every day and the hydralazine  will be restarted at 50 mg bid.   Refills on spiro, losartan w/ increase to 100 mg every day, Lasix  40 mg every day were sent in to [facility] on [address]. Hydralazine  was restarted at 50 mg bid.  There were no vitals filed for this visit. There is no height or weight on file to calculate BMI.  Per Care Everywhere 2015:  MI 2004, s/p PTCA OM CVA - L 2012 Bipolar  He is encouraged to go to Behavioral Health to get his mental health issues cared for.  We will arrange a PCP appt, he can no longer get to his PA in [place].    Shona Shad, PA-C 04/12/2024 2:34 PM  I have seen and examined this patient with the mid-level provider and agree with the above note . Belvie Silvan

## 2024-04-12 NOTE — Congregational Nurse Program (Signed)
  Dept: [phone]   Congregational Nurse Program Note  Date of Encounter: 04/12/2024  Past Medical History: Past Medical History:  Diagnosis Date   Acute on chronic combined systolic and diastolic CHF (congestive heart failure) (HCC) 07/11/2023   AKI (acute kidney injury) 07/10/2023   Allergic rhinitis    Anxiety    Asthma    Bilateral lower extremity edema 07/10/2023   Bipolar disorder (HCC)    Bradycardia    CAD (coronary artery disease)    Chest pain 03/18/2024   CKD (chronic kidney disease) stage 3, GFR 30-59 ml/min (HCC) 01/28/2024   Cocaine abuse (HCC) 05/28/2023   Cocaine use 01/28/2024   Continuous dependence on cigarette smoking 03/19/2021   CVA (cerebral vascular accident) (HCC)    Dyspnea on exertion 03/19/2021   Elevated LFTs 07/10/2023   Elevated troponin 01/28/2024   Essential hypertension 03/11/2021   Flash pulmonary edema (HCC) 01/28/2024   GERD (gastroesophageal reflux disease)    Headache    Hepatitis C    History of CAD (coronary artery disease) 01/28/2024   History of cocaine use 01/28/2024   History of CVA (cerebrovascular accident) 03/11/2021            History of heart artery stent    History of myocardial infarction    History of stroke    Hypercholesterolemia    Hypertension    Marijuana use 01/28/2024   MI (myocardial infarction) (HCC)    Non-insulin  dependent type 2 diabetes mellitus (HCC) 07/10/2023   NSVT (nonsustained ventricular tachycardia) (HCC) 07/19/2023   Protein-calorie malnutrition, severe 07/12/2023   Stroke (HCC)    Swelling of both lower extremities 05/26/2023    Encounter Details:  Community Questionnaire - 04/12/24 1305       Questionnaire   Ask client: Do you give verbal consent for me to treat you today? Yes    Student Assistance N/A    Location Patient Served  [facility]    Encounter Setting CN site    Population Status Unhoused    Insurance Medicare    Insurance/Financial Assistance Referral N/A    Medication  N/A    Medical Provider Yes    Screening Referrals Made N/A    Medical Referrals Made [facility] PCP/Clinic    Medical Appointment Completed N/A    CNP Interventions Advocate/Support;Navigate Healthcare System    Screenings CN Performed Blood Pressure;Weight    ED Visit Averted N/A    Life-Saving Intervention Made N/A          Client requested BP check. Blood pressure (!) 174/109, pulse 62, height 6' 2 (1.88 m), weight 160 lb 3.2 oz (72.7 kg), SpO2 98%.  BP elevated, refereed to see Dr. Brien at [facility] clinic today.  Mitzie RAMAN RN, BSN

## 2024-04-13 ENCOUNTER — Encounter: Payer: Self-pay | Admitting: *Deleted

## 2024-04-13 NOTE — Congregational Nurse Program (Signed)
  Dept: [phone]   Congregational Nurse Program Note  Date of Encounter: 04/13/2024  Past Medical History: Past Medical History:  Diagnosis Date   Acute on chronic combined systolic and diastolic CHF (congestive heart failure) (HCC) 07/11/2023   AKI (acute kidney injury) 07/10/2023   Allergic rhinitis    Anxiety    Asthma    Bilateral lower extremity edema 07/10/2023   Bipolar disorder (HCC)    Bradycardia    CAD (coronary artery disease)    Chest pain 03/18/2024   CKD (chronic kidney disease) stage 3, GFR 30-59 ml/min (HCC) 01/28/2024   Cocaine abuse (HCC) 05/28/2023   Cocaine use 01/28/2024   Continuous dependence on cigarette smoking 03/19/2021   CVA (cerebral vascular accident) (HCC)    Dyspnea on exertion 03/19/2021   Elevated LFTs 07/10/2023   Elevated troponin 01/28/2024   Essential hypertension 03/11/2021   Flash pulmonary edema (HCC) 01/28/2024   GERD (gastroesophageal reflux disease)    Headache    Hepatitis C    History of CAD (coronary artery disease) 01/28/2024   History of cocaine use 01/28/2024   History of CVA (cerebrovascular accident) 03/11/2021            History of heart artery stent    History of myocardial infarction    History of stroke    Hypercholesterolemia    Hypertension    Marijuana use 01/28/2024   MI (myocardial infarction) (HCC)    Non-insulin  dependent type 2 diabetes mellitus (HCC) 07/10/2023   NSVT (nonsustained ventricular tachycardia) (HCC) 07/19/2023   Protein-calorie malnutrition, severe 07/12/2023   Stroke (HCC)    Swelling of both lower extremities 05/26/2023    Encounter Details:  Community Questionnaire - 04/13/24 1332       Questionnaire   Ask client: Do you give verbal consent for me to treat you today? Yes    Student Assistance N/A    Location Patient Served  [facility]    Encounter Setting CN site    Population Status Unhoused    Insurance Medicare    Insurance/Financial Assistance Referral N/A    Medication  Have Medication Insecurities;Patient Medications Reviewed;Provided Medication Assistance    Medical Provider Yes    Screening Referrals Made N/A    Medical Referrals Made N/A    Medical Appointment Completed N/A    CNP Interventions Advocate/Support;Case Management    Screenings CN Performed Blood Pressure    ED Visit Averted N/A    Life-Saving Intervention Made N/A         Picked up medication from pharmacy and brought to client at [facility] per MD request. Medications reviewed with client. He acknowledges understanding. Sonita Michiels W RN CN

## 2024-05-17 ENCOUNTER — Other Ambulatory Visit: Payer: Self-pay

## 2024-05-19 ENCOUNTER — Other Ambulatory Visit: Payer: Self-pay

## 2024-05-31 ENCOUNTER — Ambulatory Visit: Admitting: Family Medicine

## 2024-05-31 ENCOUNTER — Telehealth: Payer: Self-pay | Admitting: Physician Assistant

## 2024-05-31 NOTE — Telephone Encounter (Signed)
 Called pt to reschedule missed appt; could not reach or leave vm

## 2024-06-05 ENCOUNTER — Other Ambulatory Visit (HOSPITAL_COMMUNITY): Payer: Self-pay

## 2024-06-15 DEATH — deceased
